# Patient Record
Sex: Male | Born: 1945 | Race: White | Hispanic: No | Marital: Married | Smoking: Never smoker
Health system: Southern US, Community
[De-identification: ages and names within clinical notes are randomized; demographics above are authoritative.]

## PROBLEM LIST (undated history)

## (undated) DIAGNOSIS — K635 Polyp of colon: Secondary | ICD-10-CM

## (undated) DIAGNOSIS — L570 Actinic keratosis: Secondary | ICD-10-CM

## (undated) DIAGNOSIS — D692 Other nonthrombocytopenic purpura: Secondary | ICD-10-CM

## (undated) DIAGNOSIS — Z972 Presence of dental prosthetic device (complete) (partial): Secondary | ICD-10-CM

## (undated) DIAGNOSIS — I10 Essential (primary) hypertension: Secondary | ICD-10-CM

## (undated) HISTORY — DX: Polyp of colon: K63.5

## (undated) HISTORY — DX: Actinic keratosis: L57.0

## (undated) HISTORY — DX: Other nonthrombocytopenic purpura: D69.2

---

## 1970-03-28 HISTORY — PX: KNEE SURGERY: SHX244

## 1998-03-28 HISTORY — PX: HAND / FINGER TENDON LESION EXCISION: SUR534

## 2005-08-08 ENCOUNTER — Ambulatory Visit: Payer: Self-pay | Admitting: Gastroenterology

## 2012-03-28 HISTORY — PX: EYE SURGERY: SHX253

## 2012-06-18 ENCOUNTER — Emergency Department (HOSPITAL_COMMUNITY): Payer: Medicare Other

## 2012-06-18 ENCOUNTER — Encounter (HOSPITAL_COMMUNITY): Payer: Self-pay | Admitting: Emergency Medicine

## 2012-06-18 ENCOUNTER — Encounter (HOSPITAL_COMMUNITY): Admission: EM | Disposition: A | Discharge status: Home / Self Care | Payer: Self-pay | Source: Home / Self Care

## 2012-06-18 ENCOUNTER — Encounter (HOSPITAL_COMMUNITY): Payer: Self-pay | Admitting: Anesthesiology

## 2012-06-18 ENCOUNTER — Inpatient Hospital Stay (HOSPITAL_COMMUNITY)
Admission: EM | Admit: 2012-06-18 | Discharge: 2012-06-27 | DRG: 957 | Disposition: A | Discharge status: Home / Self Care | Payer: Medicare Other | Attending: Orthopedic Surgery | Admitting: Orthopedic Surgery

## 2012-06-18 ENCOUNTER — Inpatient Hospital Stay (HOSPITAL_COMMUNITY): Payer: Medicare Other | Admitting: Anesthesiology

## 2012-06-18 ENCOUNTER — Inpatient Hospital Stay (HOSPITAL_COMMUNITY): Payer: Medicare Other

## 2012-06-18 DIAGNOSIS — K560 Paralytic ileus: Secondary | ICD-10-CM | POA: Diagnosis not present

## 2012-06-18 DIAGNOSIS — IMO0002 Reserved for concepts with insufficient information to code with codable children: Principal | ICD-10-CM | POA: Diagnosis present

## 2012-06-18 DIAGNOSIS — S81809A Unspecified open wound, unspecified lower leg, initial encounter: Secondary | ICD-10-CM

## 2012-06-18 DIAGNOSIS — S36039A Unspecified laceration of spleen, initial encounter: Secondary | ICD-10-CM

## 2012-06-18 DIAGNOSIS — S270XXA Traumatic pneumothorax, initial encounter: Secondary | ICD-10-CM

## 2012-06-18 DIAGNOSIS — S42102A Fracture of unspecified part of scapula, left shoulder, initial encounter for closed fracture: Secondary | ICD-10-CM

## 2012-06-18 DIAGNOSIS — S92309A Fracture of unspecified metatarsal bone(s), unspecified foot, initial encounter for closed fracture: Secondary | ICD-10-CM | POA: Diagnosis present

## 2012-06-18 DIAGNOSIS — S91009A Unspecified open wound, unspecified ankle, initial encounter: Secondary | ICD-10-CM

## 2012-06-18 DIAGNOSIS — S61409A Unspecified open wound of unspecified hand, initial encounter: Secondary | ICD-10-CM | POA: Diagnosis present

## 2012-06-18 DIAGNOSIS — R0989 Other specified symptoms and signs involving the circulatory and respiratory systems: Secondary | ICD-10-CM | POA: Diagnosis present

## 2012-06-18 DIAGNOSIS — R0609 Other forms of dyspnea: Secondary | ICD-10-CM | POA: Diagnosis present

## 2012-06-18 DIAGNOSIS — S2242XA Multiple fractures of ribs, left side, initial encounter for closed fracture: Secondary | ICD-10-CM

## 2012-06-18 DIAGNOSIS — J9819 Other pulmonary collapse: Secondary | ICD-10-CM | POA: Diagnosis not present

## 2012-06-18 DIAGNOSIS — S62308B Unspecified fracture of other metacarpal bone, initial encounter for open fracture: Secondary | ICD-10-CM

## 2012-06-18 DIAGNOSIS — W19XXXA Unspecified fall, initial encounter: Secondary | ICD-10-CM

## 2012-06-18 DIAGNOSIS — D62 Acute posthemorrhagic anemia: Secondary | ICD-10-CM | POA: Diagnosis not present

## 2012-06-18 DIAGNOSIS — R000 Tachycardia, unspecified: Secondary | ICD-10-CM | POA: Diagnosis present

## 2012-06-18 DIAGNOSIS — Y92009 Unspecified place in unspecified non-institutional (private) residence as the place of occurrence of the external cause: Secondary | ICD-10-CM

## 2012-06-18 DIAGNOSIS — S42109A Fracture of unspecified part of scapula, unspecified shoulder, initial encounter for closed fracture: Secondary | ICD-10-CM | POA: Diagnosis present

## 2012-06-18 DIAGNOSIS — S272XXA Traumatic hemopneumothorax, initial encounter: Secondary | ICD-10-CM

## 2012-06-18 DIAGNOSIS — S2249XA Multiple fractures of ribs, unspecified side, initial encounter for closed fracture: Secondary | ICD-10-CM

## 2012-06-18 DIAGNOSIS — W1789XA Other fall from one level to another, initial encounter: Secondary | ICD-10-CM | POA: Diagnosis present

## 2012-06-18 DIAGNOSIS — J95821 Acute postprocedural respiratory failure: Secondary | ICD-10-CM | POA: Diagnosis present

## 2012-06-18 DIAGNOSIS — J9600 Acute respiratory failure, unspecified whether with hypoxia or hypercapnia: Secondary | ICD-10-CM | POA: Diagnosis not present

## 2012-06-18 DIAGNOSIS — I10 Essential (primary) hypertension: Secondary | ICD-10-CM | POA: Diagnosis present

## 2012-06-18 DIAGNOSIS — S61419A Laceration without foreign body of unspecified hand, initial encounter: Secondary | ICD-10-CM

## 2012-06-18 DIAGNOSIS — S81009A Unspecified open wound, unspecified knee, initial encounter: Secondary | ICD-10-CM

## 2012-06-18 DIAGNOSIS — S92301A Fracture of unspecified metatarsal bone(s), right foot, initial encounter for closed fracture: Secondary | ICD-10-CM

## 2012-06-18 DIAGNOSIS — S62607B Fracture of unspecified phalanx of left little finger, initial encounter for open fracture: Secondary | ICD-10-CM

## 2012-06-18 DIAGNOSIS — S3600XA Unspecified injury of spleen, initial encounter: Secondary | ICD-10-CM | POA: Diagnosis present

## 2012-06-18 DIAGNOSIS — N179 Acute kidney failure, unspecified: Secondary | ICD-10-CM | POA: Diagnosis present

## 2012-06-18 DIAGNOSIS — R4182 Altered mental status, unspecified: Secondary | ICD-10-CM | POA: Diagnosis present

## 2012-06-18 HISTORY — DX: Essential (primary) hypertension: I10

## 2012-06-18 HISTORY — PX: I & D EXTREMITY: SHX5045

## 2012-06-18 LAB — TYPE AND SCREEN
ABO/RH(D): A POS
Unit division: 0

## 2012-06-18 LAB — BLOOD GAS, ARTERIAL
Drawn by: 31288
PEEP: 8 cmH2O
Pressure control: 15 cmH2O
pCO2 arterial: 45.4 mmHg — ABNORMAL HIGH (ref 35.0–45.0)
pO2, Arterial: 73.3 mmHg — ABNORMAL LOW (ref 80.0–100.0)

## 2012-06-18 LAB — COMPREHENSIVE METABOLIC PANEL
ALT: 42 U/L (ref 0–53)
AST: 51 U/L — ABNORMAL HIGH (ref 0–37)
Albumin: 4.2 g/dL (ref 3.5–5.2)
Alkaline Phosphatase: 62 U/L (ref 39–117)
Chloride: 100 mEq/L (ref 96–112)
Potassium: 3.7 mEq/L (ref 3.5–5.1)
Sodium: 138 mEq/L (ref 135–145)
Total Bilirubin: 0.5 mg/dL (ref 0.3–1.2)

## 2012-06-18 LAB — CBC
MCH: 31.5 pg (ref 26.0–34.0)
MCV: 88.4 fL (ref 78.0–100.0)
Platelets: 240 10*3/uL (ref 150–400)
RBC: 5.01 MIL/uL (ref 4.22–5.81)

## 2012-06-18 LAB — POCT I-STAT, CHEM 8
Chloride: 103 mEq/L (ref 96–112)
HCT: 47 % (ref 39.0–52.0)
Potassium: 3.5 mEq/L (ref 3.5–5.1)

## 2012-06-18 LAB — PROTIME-INR
INR: 0.99 (ref 0.00–1.49)
Prothrombin Time: 13 seconds (ref 11.6–15.2)

## 2012-06-18 SURGERY — IRRIGATION AND DEBRIDEMENT EXTREMITY
Anesthesia: General | Site: Hand | Laterality: Left | Wound class: Contaminated

## 2012-06-18 MED ORDER — ETOMIDATE 2 MG/ML IV SOLN
INTRAVENOUS | Status: AC
  Filled 2012-06-18: qty 20

## 2012-06-18 MED ORDER — SODIUM CHLORIDE 0.9 % IJ SOLN
9.0000 mL | INTRAMUSCULAR | Status: DC | PRN
  Administered 2012-06-21: 9 mL via INTRAVENOUS

## 2012-06-18 MED ORDER — NALOXONE HCL 0.4 MG/ML IJ SOLN: 0.4000 mg | INTRAMUSCULAR | Status: DC | PRN

## 2012-06-18 MED ORDER — ONDANSETRON HCL 4 MG/2ML IJ SOLN: 4.0000 mg | Freq: Four times a day (QID) | INTRAMUSCULAR | Status: DC | PRN

## 2012-06-18 MED ORDER — EPHEDRINE SULFATE 50 MG/ML IJ SOLN
INTRAMUSCULAR | Status: DC | PRN
  Administered 2012-06-18 (×2): 5 mg via INTRAVENOUS

## 2012-06-18 MED ORDER — DIPHENHYDRAMINE HCL 50 MG/ML IJ SOLN
12.5000 mg | Freq: Four times a day (QID) | INTRAMUSCULAR | Status: DC | PRN
  Administered 2012-06-24: 12.5 mg via INTRAVENOUS
  Filled 2012-06-18: qty 1

## 2012-06-18 MED ORDER — MORPHINE SULFATE 2 MG/ML IJ SOLN
INTRAMUSCULAR | Status: DC | PRN
  Administered 2012-06-18: 4 mg via INTRAVENOUS
  Administered 2012-06-18: 2 mg via INTRAVENOUS
  Administered 2012-06-18: 4 mg via INTRAVENOUS

## 2012-06-18 MED ORDER — PROPOFOL 10 MG/ML IV EMUL
5.0000 ug/kg/min | INTRAVENOUS | Status: DC
  Administered 2012-06-19: 40 ug/kg/min via INTRAVENOUS
  Administered 2012-06-19: 35 ug/kg/min via INTRAVENOUS
  Administered 2012-06-19: 40 ug/kg/min via INTRAVENOUS
  Administered 2012-06-19: 35 ug/kg/min via INTRAVENOUS
  Administered 2012-06-19: 40 ug/kg/min via INTRAVENOUS
  Filled 2012-06-18 (×8): qty 100

## 2012-06-18 MED ORDER — PROPOFOL 10 MG/ML IV EMUL
5.0000 ug/kg/min | INTRAVENOUS | Status: DC
  Administered 2012-06-18: 50 ug/kg/min via INTRAVENOUS

## 2012-06-18 MED ORDER — DIPHENHYDRAMINE HCL 12.5 MG/5ML PO ELIX
12.5000 mg | ORAL_SOLUTION | Freq: Four times a day (QID) | ORAL | Status: DC | PRN
  Filled 2012-06-18: qty 5

## 2012-06-18 MED ORDER — MIDAZOLAM HCL 5 MG/5ML IJ SOLN
INTRAMUSCULAR | Status: DC | PRN
  Administered 2012-06-18 (×2): 1 mg via INTRAVENOUS

## 2012-06-18 MED ORDER — FENTANYL BOLUS VIA INFUSION
25.0000 ug | Freq: Four times a day (QID) | INTRAVENOUS | Status: DC | PRN
  Filled 2012-06-18: qty 100

## 2012-06-18 MED ORDER — SUCCINYLCHOLINE CHLORIDE 20 MG/ML IJ SOLN
INTRAMUSCULAR | Status: AC
  Filled 2012-06-18: qty 1

## 2012-06-18 MED ORDER — BUPIVACAINE HCL (PF) 0.25 % IJ SOLN
INTRAMUSCULAR | Status: DC | PRN
  Administered 2012-06-18: 8 mL

## 2012-06-18 MED ORDER — PANTOPRAZOLE SODIUM 40 MG PO TBEC: 40.0000 mg | DELAYED_RELEASE_TABLET | Freq: Every day | ORAL | Status: DC

## 2012-06-18 MED ORDER — VANCOMYCIN HCL IN DEXTROSE 1-5 GM/200ML-% IV SOLN
1000.0000 mg | Freq: Once | INTRAVENOUS | Status: AC
  Administered 2012-06-18: 1000 mg via INTRAVENOUS
  Filled 2012-06-18: qty 200

## 2012-06-18 MED ORDER — LIDOCAINE HCL (CARDIAC) 20 MG/ML IV SOLN
INTRAVENOUS | Status: DC | PRN
  Administered 2012-06-18: 60 mg via INTRAVENOUS
  Administered 2012-06-18: 80 mg via INTRAVENOUS

## 2012-06-18 MED ORDER — SODIUM CHLORIDE 0.9 % IV SOLN
INTRAVENOUS | Status: DC | PRN
  Administered 2012-06-18: 1000 mL via INTRAVENOUS

## 2012-06-18 MED ORDER — IOHEXOL 300 MG/ML  SOLN
80.0000 mL | Freq: Once | INTRAMUSCULAR | Status: AC | PRN
  Administered 2012-06-18: 80 mL via INTRAVENOUS

## 2012-06-18 MED ORDER — PHENYLEPHRINE HCL 10 MG/ML IJ SOLN
INTRAMUSCULAR | Status: DC | PRN
  Administered 2012-06-18: 80 ug via INTRAVENOUS
  Administered 2012-06-18: 40 ug via INTRAVENOUS

## 2012-06-18 MED ORDER — MORPHINE SULFATE 2 MG/ML IJ SOLN
INTRAMUSCULAR | Status: AC
  Filled 2012-06-18: qty 2

## 2012-06-18 MED ORDER — OXYCODONE HCL 5 MG PO TABS: 5.0000 mg | ORAL_TABLET | Freq: Once | ORAL | Status: DC | PRN

## 2012-06-18 MED ORDER — SODIUM CHLORIDE 0.9 % IV SOLN
25.0000 ug/h | INTRAVENOUS | Status: DC
  Administered 2012-06-18 – 2012-06-19 (×2): 50 ug/h via INTRAVENOUS
  Administered 2012-06-19 – 2012-06-20 (×2): 125 ug/h via INTRAVENOUS
  Administered 2012-06-21: 180 ug/h via INTRAVENOUS
  Filled 2012-06-18 (×6): qty 50

## 2012-06-18 MED ORDER — ENOXAPARIN SODIUM 40 MG/0.4ML ~~LOC~~ SOLN: 40.0000 mg | SUBCUTANEOUS | Status: DC

## 2012-06-18 MED ORDER — POTASSIUM CHLORIDE IN NACL 20-0.9 MEQ/L-% IV SOLN
INTRAVENOUS | Status: DC
  Administered 2012-06-18 – 2012-06-25 (×13): via INTRAVENOUS
  Filled 2012-06-18 (×18): qty 1000

## 2012-06-18 MED ORDER — HYDROMORPHONE 0.3 MG/ML IV SOLN: INTRAVENOUS | Status: DC

## 2012-06-18 MED ORDER — SODIUM CHLORIDE 0.9 % IR SOLN
Status: DC | PRN
  Administered 2012-06-18: 1000 mL

## 2012-06-18 MED ORDER — CHLORHEXIDINE GLUCONATE 0.12 % MT SOLN
OROMUCOSAL | Status: AC
  Administered 2012-06-18: 15 mL
  Filled 2012-06-18: qty 15

## 2012-06-18 MED ORDER — PHENYLEPHRINE HCL 10 MG/ML IJ SOLN
10.0000 mg | INTRAVENOUS | Status: DC | PRN
  Administered 2012-06-18: 25 ug/min via INTRAVENOUS

## 2012-06-18 MED ORDER — ARTIFICIAL TEARS OP OINT
TOPICAL_OINTMENT | OPHTHALMIC | Status: DC | PRN
  Administered 2012-06-18: 1 via OPHTHALMIC

## 2012-06-18 MED ORDER — OXYCODONE HCL 5 MG/5ML PO SOLN: 5.0000 mg | Freq: Once | ORAL | Status: DC | PRN

## 2012-06-18 MED ORDER — LIDOCAINE HCL (CARDIAC) 20 MG/ML IV SOLN
INTRAVENOUS | Status: AC
  Filled 2012-06-18: qty 5

## 2012-06-18 MED ORDER — ROCURONIUM BROMIDE 50 MG/5ML IV SOLN
INTRAVENOUS | Status: AC
  Filled 2012-06-18: qty 2

## 2012-06-18 MED ORDER — FENTANYL CITRATE 0.05 MG/ML IJ SOLN
INTRAMUSCULAR | Status: DC | PRN
  Administered 2012-06-18 (×2): 25 ug via INTRAVENOUS
  Administered 2012-06-18 (×2): 50 ug via INTRAVENOUS
  Administered 2012-06-18: 100 ug via INTRAVENOUS
  Administered 2012-06-18: 50 ug via INTRAVENOUS

## 2012-06-18 MED ORDER — MIDAZOLAM HCL 2 MG/2ML IJ SOLN
0.5000 mg | Freq: Once | INTRAMUSCULAR | Status: AC | PRN
  Administered 2012-06-18: 2 mg via INTRAVENOUS

## 2012-06-18 MED ORDER — PROPOFOL 10 MG/ML IV BOLUS
INTRAVENOUS | Status: DC | PRN
  Administered 2012-06-18: 170 mg via INTRAVENOUS

## 2012-06-18 MED ORDER — LACTATED RINGERS IV SOLN
INTRAVENOUS | Status: DC | PRN
  Administered 2012-06-18 (×2): via INTRAVENOUS

## 2012-06-18 MED ORDER — PANTOPRAZOLE SODIUM 40 MG IV SOLR
40.0000 mg | Freq: Every day | INTRAVENOUS | Status: DC
  Administered 2012-06-19 – 2012-06-25 (×6): 40 mg via INTRAVENOUS
  Filled 2012-06-18 (×9): qty 40

## 2012-06-18 MED ORDER — VANCOMYCIN HCL IN DEXTROSE 1-5 GM/200ML-% IV SOLN
1000.0000 mg | INTRAVENOUS | Status: AC
  Administered 2012-06-19: 1000 mg via INTRAVENOUS
  Filled 2012-06-18: qty 200

## 2012-06-18 MED ORDER — MORPHINE SULFATE 4 MG/ML IJ SOLN
INTRAMUSCULAR | Status: AC
  Filled 2012-06-18: qty 1

## 2012-06-18 MED ORDER — SUCCINYLCHOLINE CHLORIDE 20 MG/ML IJ SOLN
INTRAMUSCULAR | Status: DC | PRN
  Administered 2012-06-18: 100 mg via INTRAVENOUS

## 2012-06-18 MED ORDER — MIDAZOLAM HCL 2 MG/2ML IJ SOLN
INTRAMUSCULAR | Status: AC
  Administered 2012-06-18: 2 mg
  Filled 2012-06-18: qty 2

## 2012-06-18 SURGICAL SUPPLY — 64 items
BANDAGE CONFORM 2  STR LF (GAUZE/BANDAGES/DRESSINGS) IMPLANT
BANDAGE ELASTIC 3 VELCRO ST LF (GAUZE/BANDAGES/DRESSINGS) ×3 IMPLANT
BANDAGE ELASTIC 4 VELCRO ST LF (GAUZE/BANDAGES/DRESSINGS) ×3 IMPLANT
BANDAGE GAUZE ELAST BULKY 4 IN (GAUZE/BANDAGES/DRESSINGS) ×3 IMPLANT
BNDG CMPR 9X4 STRL LF SNTH (GAUZE/BANDAGES/DRESSINGS) ×2
BNDG ESMARK 4X9 LF (GAUZE/BANDAGES/DRESSINGS) ×3 IMPLANT
CLOTH BEACON ORANGE TIMEOUT ST (SAFETY) ×3 IMPLANT
CORDS BIPOLAR (ELECTRODE) ×3 IMPLANT
COVER SURGICAL LIGHT HANDLE (MISCELLANEOUS) ×3 IMPLANT
CUFF TOURNIQUET SINGLE 18IN (TOURNIQUET CUFF) ×3 IMPLANT
CUFF TOURNIQUET SINGLE 24IN (TOURNIQUET CUFF) IMPLANT
DECANTER SPIKE VIAL GLASS SM (MISCELLANEOUS) ×3 IMPLANT
DRAIN PENROSE 1/4X12 LTX STRL (WOUND CARE) IMPLANT
DRAPE OEC MINIVIEW 54X84 (DRAPES) ×3 IMPLANT
DRAPE SURG 17X23 STRL (DRAPES) ×3 IMPLANT
DRSG PAD ABDOMINAL 8X10 ST (GAUZE/BANDAGES/DRESSINGS) ×6 IMPLANT
DURAPREP 26ML APPLICATOR (WOUND CARE) IMPLANT
ELECT REM PT RETURN 9FT ADLT (ELECTROSURGICAL)
ELECTRODE REM PT RTRN 9FT ADLT (ELECTROSURGICAL) IMPLANT
GAUZE PACKING IODOFORM 1/4X5 (PACKING) IMPLANT
GAUZE XEROFORM 1X8 LF (GAUZE/BANDAGES/DRESSINGS) ×3 IMPLANT
GLOVE BIO SURGEON STRL SZ8.5 (GLOVE) ×3 IMPLANT
GLOVE BIOGEL PI IND STRL 8 (GLOVE) IMPLANT
GLOVE BIOGEL PI INDICATOR 8 (GLOVE)
GOWN PREVENTION PLUS XLARGE (GOWN DISPOSABLE) ×3 IMPLANT
GOWN STRL NON-REIN LRG LVL3 (GOWN DISPOSABLE) ×6 IMPLANT
HANDPIECE INTERPULSE COAX TIP (DISPOSABLE)
KIT BASIN OR (CUSTOM PROCEDURE TRAY) ×3 IMPLANT
KIT ROOM TURNOVER OR (KITS) ×3 IMPLANT
LOOP VESSEL MAXI BLUE (MISCELLANEOUS) IMPLANT
MANIFOLD NEPTUNE II (INSTRUMENTS) ×3 IMPLANT
NEEDLE HYPO 25GX1X1/2 BEV (NEEDLE) IMPLANT
NEEDLE HYPO 25X1 1.5 SAFETY (NEEDLE) ×3 IMPLANT
NS IRRIG 1000ML POUR BTL (IV SOLUTION) ×6 IMPLANT
PACK ORTHO EXTREMITY (CUSTOM PROCEDURE TRAY) ×3 IMPLANT
PAD ARMBOARD 7.5X6 YLW CONV (MISCELLANEOUS) ×6 IMPLANT
PAD CAST 4YDX4 CTTN HI CHSV (CAST SUPPLIES) ×2 IMPLANT
PADDING CAST COTTON 4X4 STRL (CAST SUPPLIES) ×3
SET HNDPC FAN SPRY TIP SCT (DISPOSABLE) IMPLANT
SPEAR EYE SURG WECK-CEL (MISCELLANEOUS) IMPLANT
SPECIMEN JAR SMALL (MISCELLANEOUS) IMPLANT
SPLINT PLASTER CAST XFAST 4X15 (CAST SUPPLIES) ×2 IMPLANT
SPLINT PLASTER XTRA FAST SET 4 (CAST SUPPLIES) ×1
SPONGE GAUZE 4X4 12PLY (GAUZE/BANDAGES/DRESSINGS) ×3 IMPLANT
SPONGE LAP 18X18 X RAY DECT (DISPOSABLE) ×3 IMPLANT
STRIP CLOSURE SKIN 1/2X4 (GAUZE/BANDAGES/DRESSINGS) IMPLANT
SUCTION FRAZIER TIP 10 FR DISP (SUCTIONS) ×3 IMPLANT
SUT ETHIBOND 3-0 V-5 (SUTURE) IMPLANT
SUT ETHILON 5 0 PS 2 18 (SUTURE) IMPLANT
SUT MERSILENE 4 0 P 3 (SUTURE) IMPLANT
SUT PROLENE 3 0 PS 2 (SUTURE) IMPLANT
SUT SILK 4 0 PS 2 (SUTURE) IMPLANT
SUT VIC AB 3-0 FS2 27 (SUTURE) IMPLANT
SUT VICRYL RAPIDE 4/0 PS 2 (SUTURE) ×9 IMPLANT
SYR 20CC LL (SYRINGE) IMPLANT
SYR CONTROL 10ML LL (SYRINGE) ×3 IMPLANT
TOWEL OR 17X24 6PK STRL BLUE (TOWEL DISPOSABLE) ×3 IMPLANT
TOWEL OR 17X26 10 PK STRL BLUE (TOWEL DISPOSABLE) ×3 IMPLANT
TRAY FOLEY CATH 14FR (SET/KITS/TRAYS/PACK) ×3 IMPLANT
TUBE ANAEROBIC SPECIMEN COL (MISCELLANEOUS) IMPLANT
TUBE CONNECTING 12X1/4 (SUCTIONS) ×3 IMPLANT
UNDERPAD 30X30 INCONTINENT (UNDERPADS AND DIAPERS) ×3 IMPLANT
WATER STERILE IRR 1000ML POUR (IV SOLUTION) ×3 IMPLANT
YANKAUER SUCT BULB TIP NO VENT (SUCTIONS) ×3 IMPLANT

## 2012-06-18 NOTE — Consult Note (Signed)
Reason for Consult:  Left scapula body fracture Referring Physician:   Lindie Spruce, MD  Ankit Degregorio is an 67 y.o. male.  HPI:   67 yo fall from height out of a tree he was trimming limbs from.  I am seeing him in the PACU post surgery on his left hand.  He is currently intubated.  History reviewed. No pertinent past medical history.  Past Surgical History  Procedure Laterality Date  . Hand / finger tendon lesion excision Left 2000  . Knee surgery Left 1972    No family history on file.  Social History:  has no tobacco, alcohol, and drug history on file.  Allergies:  Allergies  Allergen Reactions  . Penicillins Hives    Medications: I have reviewed the patient's current medications.  Results for orders placed during the hospital encounter of 06/18/12 (from the past 48 hour(s))  TYPE AND SCREEN     Status: None   Collection Time    06/18/12  3:50 PM      Result Value Range   ABO/RH(D) A POS     Antibody Screen NEG     Sample Expiration 06/21/2012     Unit Number [id number]     Blood Component Type RED CELLS,LR     Unit division 00     Status of Unit REL FROM [facility]     Unit tag comment VERBAL ORDERS PER DR LOCKWOOD     Transfusion Status OK TO TRANSFUSE     Crossmatch Result NOT NEEDED     Unit Number [id number]     Blood Component Type RED CELLS,LR     Unit division 00     Status of Unit REL FROM [facility]     Unit tag comment VERBAL ORDERS PER DR LOCKWOOD     Transfusion Status OK TO TRANSFUSE     Crossmatch Result NOT NEEDED    ABO/RH     Status: None   Collection Time    06/18/12  3:50 PM      Result Value Range   ABO/RH(D) A POS    POCT I-STAT, CHEM 8     Status: Abnormal   Collection Time    06/18/12  4:07 PM      Result Value Range   Sodium 139  135 - 145 mEq/L   Potassium 3.5  3.5 - 5.1 mEq/L   Chloride 103  96 - 112 mEq/L   BUN 13  6 - 23 mg/dL   Creatinine, Ser 3.08 (*) 0.50 - 1.35 mg/dL   Glucose, Bld 657 (*) 70 - 99 mg/dL   Calcium, Ion 8.46  9.62  - 1.30 mmol/L   TCO2 24  0 - 100 mmol/L   Hemoglobin 16.0  13.0 - 17.0 g/dL   HCT 95.2  84.1 - 32.4 %  COMPREHENSIVE METABOLIC PANEL     Status: Abnormal   Collection Time    06/18/12  4:22 PM      Result Value Range   Sodium 138  135 - 145 mEq/L   Potassium 3.7  3.5 - 5.1 mEq/L   Chloride 100  96 - 112 mEq/L   CO2 24  19 - 32 mEq/L   Glucose, Bld 204 (*) 70 - 99 mg/dL   BUN 13  6 - 23 mg/dL   Creatinine, Ser 4.01  0.50 - 1.35 mg/dL   Calcium 9.8  8.4 - 02.7 mg/dL   Total Protein 7.3  6.0 - 8.3 g/dL   Albumin 4.2  3.5 - 5.2 g/dL   AST 51 (*) 0 - 37 U/L   ALT 42  0 - 53 U/L   Alkaline Phosphatase 62  39 - 117 U/L   Total Bilirubin 0.5  0.3 - 1.2 mg/dL   GFR calc non Af Amer 63 (*) >90 mL/min   GFR calc Af Amer 73 (*) >90 mL/min   Comment:            The eGFR has been calculated     using the CKD EPI equation.     This calculation has not been     validated in all clinical     situations.     eGFR's persistently     <90 mL/min signify     possible Chronic Kidney Disease.  CBC     Status: Abnormal   Collection Time    06/18/12  4:22 PM      Result Value Range   WBC 17.6 (*) 4.0 - 10.5 K/uL   RBC 5.01  4.22 - 5.81 MIL/uL   Hemoglobin 15.8  13.0 - 17.0 g/dL   HCT 16.1  09.6 - 04.5 %   MCV 88.4  78.0 - 100.0 fL   MCH 31.5  26.0 - 34.0 pg   MCHC 35.7  30.0 - 36.0 g/dL   RDW 40.9  81.1 - 91.4 %   Platelets 240  150 - 400 K/uL  PROTIME-INR     Status: None   Collection Time    06/18/12  4:22 PM      Result Value Range   Prothrombin Time 13.0  11.6 - 15.2 seconds   INR 0.99  0.00 - 1.49    Ct Chest W Contrast  06/18/2012  *RADIOLOGY REPORT*  Clinical Data:  Larey Seat.  Chest and abdominal trauma.  Pneumothorax.  CT CHEST, ABDOMEN AND PELVIS WITH CONTRAST  Technique:  Multidetector CT imaging of the chest, abdomen and pelvis was performed following the standard protocol during bolus administration of intravenous contrast.  Contrast: 80mL OMNIPAQUE IOHEXOL 300 MG/ML  SOLN   Comparison:  Chest x-ray 06/18/2012.  CT CHEST  Findings:  Examination of the chest wall demonstrates extensive air throughout the deep soft tissues and in the subcutaneous fat mainly along the left chest.  There is also a pneumomediastinum, pneumothorax and air tracking up of the neck.  No chest wall hematoma.  No supraclavicular or axillary mass or adenopathy.  The sternum is intact and the thoracic vertebral bodies are normally aligned.  No definite fractures.  There are numerous left-sided rib fractures with several ribs are fractured and more than one placed consistent with a flail chest. The 3rd through 12th ribs are fractured the left side.  There is also a comminuted fracture of the left scapula with displaced fragments.  The clavicles are intact and both shoulders are intact.  The heart is normal in size.  No pericardial effusion or hematoma. The aorta is normal in caliber.  No dissection.  No mediastinal hematoma.  The esophagus grossly normal.  Coronary artery calcifications are noted.  There is fairly extensive pneumomediastinum.  Examination of the lung parenchyma demonstrates a left-sided chest tube in place.  There is a small pneumothorax.  Extensive atelectasis and possible left lower lobe pulmonary contusion.  A small left pleural effusion or pleural hematoma is noted.  IMPRESSION:  1.  Numerous left-sided rib fractures involving the 3rd through 12th ribs with several rib fractures in two places (flail chest). 2.  Pneumothorax, pneumomediastinum and  extensive subcutaneous emphysema. 3.  The heart and great vessels are intact.  No mediastinal hematoma. 4.  A small left pleural effusion/pleural hematoma with areas of bilateral atelectasis. 5.  Comminuted left scapular fracture.  CT ABDOMEN AND PELVIS  Findings:  The solid abdominal organs are intact. On the initial phase imaging cannot exclude a small contusion involving the posterior and inferior aspect of the spleen.  The gallbladder is normal.  No  common bile duct dilatation.  The stomach, duodenum, small bowel and colon are grossly normal without oral contrast.  No mesenteric or retroperitoneal mass, adenopathy or hematoma.  The aorta is normal in caliber.  Minimal scattered atherosclerotic calcifications.  The origin of the celiac access is abnormal. There is central low attenuation which could reflect clot and there is strandy interstitial change around the vessel.  Findings suspicious for acute short segment dissection.  Recommend dedicated CTA when able.  No lumbar vertebral body fractures or transverse process fractures. The facets are normally aligned.  The bony pelvis is intact.  Mild SI joint degenerative changes.  No pelvic mass or hematoma.  No free pelvic fluid collections.  The bladder, prostate gland and seminal vesicles are unremarkable.  There are bilateral inguinal hernias with a high riding right testicle in the lower inguinal canal.  The left inguinal hernia only contains fat.  IMPRESSION:  1.  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  A dedicated CT angiogram of the abdomen is recommended. 2.  Possible subtle splenic contusions.  Recommend close clinical observation. 3.  Bilateral inguinal hernias.   Original Report Authenticated By: Rudie Meyer, M.D.    Ct Cervical Spine Wo Contrast  06/18/2012  *RADIOLOGY REPORT*  Clinical Data: Larey Seat.  Injured chest.  CT CERVICAL SPINE WITHOUT CONTRAST  Technique:  Multidetector CT imaging of the cervical spine was performed. Multiplanar CT image reconstructions were also generated.  Comparison: None  Findings: The sagittal reformatted images demonstrate normal alignment of the cervical vertebral bodies.  Disc spaces and vertebral bodies are maintained.  No acute bony findings or abnormal prevertebral soft tissue swelling.  The facets are normally aligned.  No facet or laminar fractures are seen. No large disc protrusions.  The neural foramen are patent.  The  skull base C1 and C1-C2 articulations are maintained.  The dens is normal.  There are scattered cervical lymph nodes.  The lung apices are clear.  Extensive subcutaneous emphysema noted, left greater than right along with air tracking up along the esophagus and into the space of the neck related to the chest trauma and pneumomediastinum.  IMPRESSION:  1.  Normal alignment and no acute bony findings. 2.  Extensive air throughout the soft tissues.   Original Report Authenticated By: Rudie Meyer, M.D.    Ct Abdomen Pelvis W Contrast  06/18/2012  *RADIOLOGY REPORT*  Clinical Data:  Larey Seat.  Chest and abdominal trauma.  Pneumothorax.  CT CHEST, ABDOMEN AND PELVIS WITH CONTRAST  Technique:  Multidetector CT imaging of the chest, abdomen and pelvis was performed following the standard protocol during bolus administration of intravenous contrast.  Contrast: 80mL OMNIPAQUE IOHEXOL 300 MG/ML  SOLN  Comparison:  Chest x-ray 06/18/2012.  CT CHEST  Findings:  Examination of the chest wall demonstrates extensive air throughout the deep soft tissues and in the subcutaneous fat mainly along the left chest.  There is also a pneumomediastinum, pneumothorax and air tracking up of the neck.  No chest wall hematoma.  No supraclavicular or  axillary mass or adenopathy.  The sternum is intact and the thoracic vertebral bodies are normally aligned.  No definite fractures.  There are numerous left-sided rib fractures with several ribs are fractured and more than one placed consistent with a flail chest. The 3rd through 12th ribs are fractured the left side.  There is also a comminuted fracture of the left scapula with displaced fragments.  The clavicles are intact and both shoulders are intact.  The heart is normal in size.  No pericardial effusion or hematoma. The aorta is normal in caliber.  No dissection.  No mediastinal hematoma.  The esophagus grossly normal.  Coronary artery calcifications are noted.  There is fairly extensive  pneumomediastinum.  Examination of the lung parenchyma demonstrates a left-sided chest tube in place.  There is a small pneumothorax.  Extensive atelectasis and possible left lower lobe pulmonary contusion.  A small left pleural effusion or pleural hematoma is noted.  IMPRESSION:  1.  Numerous left-sided rib fractures involving the 3rd through 12th ribs with several rib fractures in two places (flail chest). 2.  Pneumothorax, pneumomediastinum and extensive subcutaneous emphysema. 3.  The heart and great vessels are intact.  No mediastinal hematoma. 4.  A small left pleural effusion/pleural hematoma with areas of bilateral atelectasis. 5.  Comminuted left scapular fracture.  CT ABDOMEN AND PELVIS  Findings:  The solid abdominal organs are intact. On the initial phase imaging cannot exclude a small contusion involving the posterior and inferior aspect of the spleen.  The gallbladder is normal.  No common bile duct dilatation.  The stomach, duodenum, small bowel and colon are grossly normal without oral contrast.  No mesenteric or retroperitoneal mass, adenopathy or hematoma.  The aorta is normal in caliber.  Minimal scattered atherosclerotic calcifications.  The origin of the celiac access is abnormal. There is central low attenuation which could reflect clot and there is strandy interstitial change around the vessel.  Findings suspicious for acute short segment dissection.  Recommend dedicated CTA when able.  No lumbar vertebral body fractures or transverse process fractures. The facets are normally aligned.  The bony pelvis is intact.  Mild SI joint degenerative changes.  No pelvic mass or hematoma.  No free pelvic fluid collections.  The bladder, prostate gland and seminal vesicles are unremarkable.  There are bilateral inguinal hernias with a high riding right testicle in the lower inguinal canal.  The left inguinal hernia only contains fat.  IMPRESSION:  1.  Findings suspicious for a short segment dissection at  the celiac axis origin.  A dedicated CT angiogram of the abdomen is recommended. 2.  Possible subtle splenic contusions.  Recommend close clinical observation. 3.  Bilateral inguinal hernias.   Original Report Authenticated By: Rudie Meyer, M.D.    Dg Pelvis Portable  06/18/2012  *RADIOLOGY REPORT*  Clinical Data: Trauma  PORTABLE PELVIS  Comparison: None.  Findings: No acute fracture and no dislocation.  IMPRESSION: No acute bony pathology.   Original Report Authenticated By: Jolaine Click, M.D.    Dg Chest Portable 1 View  06/18/2012  *RADIOLOGY REPORT*  Clinical Data: Chest tube placement, pneumothorax, rib fractures, fell from tree, flail chest  PORTABLE CHEST - 1 VIEW  Comparison: Portable exam 1615 hours compared to 06/18/2012 at 1558 hours  Findings: New left basilar thoracostomy tube. Decreased left pneumothorax. Low lung volumes with scattered infiltrate within left lung. Mild right basilar atelectasis. Multiple left rib fractures again seen. Left chest wall emphysema extending into axilla.  IMPRESSION: Decrease  in left pneumothorax following left thoracostomy tube placement. Mild scattered infiltrate in left lung with minimal right basilar atelectasis. Multiple left rib fractures.   Original Report Authenticated By: Ulyses Southward, M.D.    Dg Chest Portable 1 View  06/18/2012  *RADIOLOGY REPORT*  Clinical Data: Fall from tree with flail chest.  PORTABLE CHEST - 1 VIEW  Comparison: None.  Findings: Single supine view of the chest was obtained.  There is lucency along the left lung base and findings are consistent with a deep sulcus sign and pneumothorax.  There is a large amount of subcutaneous gas.  There are multiple displaced left rib fractures. The entire right side of the chest was not imaged.  Fractures involving the left fourth, fifth, sixth and seventh ribs.  The left fourth and fifth rib fractures may be segmental fractures.  IMPRESSION: Multiple left rib fractures with a pneumothorax.  Large  amount of subcutaneous gas.   Original Report Authenticated By: Richarda Overlie, M.D.    Dg Hand Complete Left  06/18/2012  *RADIOLOGY REPORT*  Clinical Data: Injury  LEFT HAND - COMPLETE 3+ VIEW  Comparison: None.  Findings: There is an acute fracture involving the proximal metaphysis of the proximal phalanx of the small finger.  Slight anterior displacement of the distal fragment.  Slight dorsal angulation.  There is also a spiral fracture involving the distal metaphysis of the third metacarpal with some displacement.  Soft tissue injury of the small finger may be present.  IMPRESSION: Acute fractures involving the proximal phalanx of the small finger and distal third metacarpal.   Original Report Authenticated By: Jolaine Click, M.D.     ROS Blood pressure 111/42, pulse 94, temperature 97.4 F (36.3 C), temperature source Oral, resp. rate 16, SpO2 95.00%. Physical Exam  Musculoskeletal:       Left shoulder: He exhibits swelling and crepitus.       Arms:      Legs:  Pelvis stable to ap/lat compression Bilateral hips, knees, and lower extremities stable on exam  Assessment/Plan: Left scapula body fracture 1) Non-operative treatment for this type of injury. Sling for comfort only once mobilizing. 2) Good secondary extremity survey when awake and alert  Kathryne Hitch 06/18/2012, 9:28 PM

## 2012-06-18 NOTE — Progress Notes (Signed)
This note also relates to the following rows which could not be included: Pulse Rate - Cannot attach notes to unvalidated device data ECG Heart Rate - Cannot attach notes to unvalidated device data Resp - Cannot attach notes to unvalidated device data SpO2 - Cannot attach notes to unvalidated device data   

## 2012-06-18 NOTE — Progress Notes (Signed)
Orthopedic Tech Progress Note Patient Details:  Christopher Henderson 11-25-1945 [id number]  Patient ID: Christopher Henderson, male   DOB: 1946-01-14, 67 y.o.   MRN: [id number]  Made level 1 trauma visit Nikki Dom 06/18/2012, 4:20 PM

## 2012-06-18 NOTE — Consult Note (Signed)
Reason for Consult:left hand injury Referring Physician: [name] is an 67 y.o. male.  HPI: s/p fall from tree with complex open injury to left hand  No past medical history on file.  No past surgical history on file.  No family history on file.  Social History:  has no tobacco, alcohol, and drug history on file.  Allergies: No Known Allergies  Medications: Prior to Admission:  (Not in a hospital admission)  Results for orders placed during the hospital encounter of 06/18/12 (from the past 48 hour(s))  TYPE AND SCREEN     Status: None   Collection Time    06/18/12  3:50 PM      Result Value Range   ABO/RH(D) A POS     Antibody Screen NEG     Sample Expiration 06/21/2012     Unit Number [id number]     Blood Component Type RED CELLS,LR     Unit division 00     Status of Unit REL FROM [facility]     Unit tag comment VERBAL ORDERS PER DR LOCKWOOD     Transfusion Status OK TO TRANSFUSE     Crossmatch Result PENDING     Unit Number [id number]     Blood Component Type RED CELLS,LR     Unit division 00     Status of Unit REL FROM [facility]     Unit tag comment VERBAL ORDERS PER DR LOCKWOOD     Transfusion Status OK TO TRANSFUSE     Crossmatch Result PENDING    POCT I-STAT, CHEM 8     Status: Abnormal   Collection Time    06/18/12  4:07 PM      Result Value Range   Sodium 139  135 - 145 mEq/L   Potassium 3.5  3.5 - 5.1 mEq/L   Chloride 103  96 - 112 mEq/L   BUN 13  6 - 23 mg/dL   Creatinine, Ser 4.03 (*) 0.50 - 1.35 mg/dL   Glucose, Bld 474 (*) 70 - 99 mg/dL   Calcium, Ion 2.59  5.63 - 1.30 mmol/L   TCO2 24  0 - 100 mmol/L   Hemoglobin 16.0  13.0 - 17.0 g/dL   HCT 87.5  64.3 - 32.9 %  COMPREHENSIVE METABOLIC PANEL     Status: Abnormal   Collection Time    06/18/12  4:22 PM      Result Value Range   Sodium 138  135 - 145 mEq/L   Potassium 3.7  3.5 - 5.1 mEq/L   Chloride 100  96 - 112 mEq/L   CO2 24  19 - 32 mEq/L   Glucose, Bld 204 (*) 70 - 99 mg/dL    BUN 13  6 - 23 mg/dL   Creatinine, Ser 5.18  0.50 - 1.35 mg/dL   Calcium 9.8  8.4 - 84.1 mg/dL   Total Protein 7.3  6.0 - 8.3 g/dL   Albumin 4.2  3.5 - 5.2 g/dL   AST 51 (*) 0 - 37 U/L   ALT 42  0 - 53 U/L   Alkaline Phosphatase 62  39 - 117 U/L   Total Bilirubin 0.5  0.3 - 1.2 mg/dL   GFR calc non Af Amer 63 (*) >90 mL/min   GFR calc Af Amer 73 (*) >90 mL/min   Comment:            The eGFR has been calculated     using the CKD EPI equation.  This calculation has not been     validated in all clinical     situations.     eGFR's persistently     <90 mL/min signify     possible Chronic Kidney Disease.  CBC     Status: Abnormal   Collection Time    06/18/12  4:22 PM      Result Value Range   WBC 17.6 (*) 4.0 - 10.5 K/uL   RBC 5.01  4.22 - 5.81 MIL/uL   Hemoglobin 15.8  13.0 - 17.0 g/dL   HCT 40.1  02.7 - 25.3 %   MCV 88.4  78.0 - 100.0 fL   MCH 31.5  26.0 - 34.0 pg   MCHC 35.7  30.0 - 36.0 g/dL   RDW 66.4  40.3 - 47.4 %   Platelets 240  150 - 400 K/uL  PROTIME-INR     Status: None   Collection Time    06/18/12  4:22 PM      Result Value Range   Prothrombin Time 13.0  11.6 - 15.2 seconds   INR 0.99  0.00 - 1.49    Ct Chest W Contrast  06/18/2012  *RADIOLOGY REPORT*  Clinical Data:  Larey Seat.  Chest and abdominal trauma.  Pneumothorax.  CT CHEST, ABDOMEN AND PELVIS WITH CONTRAST  Technique:  Multidetector CT imaging of the chest, abdomen and pelvis was performed following the standard protocol during bolus administration of intravenous contrast.  Contrast: 80mL OMNIPAQUE IOHEXOL 300 MG/ML  SOLN  Comparison:  Chest x-ray 06/18/2012.  CT CHEST  Findings:  Examination of the chest wall demonstrates extensive air throughout the deep soft tissues and in the subcutaneous fat mainly along the left chest.  There is also a pneumomediastinum, pneumothorax and air tracking up of the neck.  No chest wall hematoma.  No supraclavicular or axillary mass or adenopathy.  The sternum is intact and  the thoracic vertebral bodies are normally aligned.  No definite fractures.  There are numerous left-sided rib fractures with several ribs are fractured and more than one placed consistent with a flail chest. The 3rd through 12th ribs are fractured the left side.  There is also a comminuted fracture of the left scapula with displaced fragments.  The clavicles are intact and both shoulders are intact.  The heart is normal in size.  No pericardial effusion or hematoma. The aorta is normal in caliber.  No dissection.  No mediastinal hematoma.  The esophagus grossly normal.  Coronary artery calcifications are noted.  There is fairly extensive pneumomediastinum.  Examination of the lung parenchyma demonstrates a left-sided chest tube in place.  There is a small pneumothorax.  Extensive atelectasis and possible left lower lobe pulmonary contusion.  A small left pleural effusion or pleural hematoma is noted.  IMPRESSION:  1.  Numerous left-sided rib fractures involving the 3rd through 12th ribs with several rib fractures in two places (flail chest). 2.  Pneumothorax, pneumomediastinum and extensive subcutaneous emphysema. 3.  The heart and great vessels are intact.  No mediastinal hematoma. 4.  A small left pleural effusion/pleural hematoma with areas of bilateral atelectasis. 5.  Comminuted left scapular fracture.  CT ABDOMEN AND PELVIS  Findings:  The solid abdominal organs are intact. On the initial phase imaging cannot exclude a small contusion involving the posterior and inferior aspect of the spleen.  The gallbladder is normal.  No common bile duct dilatation.  The stomach, duodenum, small bowel and colon are grossly normal without oral contrast.  No  mesenteric or retroperitoneal mass, adenopathy or hematoma.  The aorta is normal in caliber.  Minimal scattered atherosclerotic calcifications.  The origin of the celiac access is abnormal. There is central low attenuation which could reflect clot and there is strandy  interstitial change around the vessel.  Findings suspicious for acute short segment dissection.  Recommend dedicated CTA when able.  No lumbar vertebral body fractures or transverse process fractures. The facets are normally aligned.  The bony pelvis is intact.  Mild SI joint degenerative changes.  No pelvic mass or hematoma.  No free pelvic fluid collections.  The bladder, prostate gland and seminal vesicles are unremarkable.  There are bilateral inguinal hernias with a high riding right testicle in the lower inguinal canal.  The left inguinal hernia only contains fat.  IMPRESSION:  1.  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  A dedicated CT angiogram of the abdomen is recommended. 2.  Possible subtle splenic contusions.  Recommend close clinical observation. 3.  Bilateral inguinal hernias.   Original Report Authenticated By: Rudie Meyer, M.D.    Ct Cervical Spine Wo Contrast  06/18/2012  *RADIOLOGY REPORT*  Clinical Data: Larey Seat.  Injured chest.  CT CERVICAL SPINE WITHOUT CONTRAST  Technique:  Multidetector CT imaging of the cervical spine was performed. Multiplanar CT image reconstructions were also generated.  Comparison: None  Findings: The sagittal reformatted images demonstrate normal alignment of the cervical vertebral bodies.  Disc spaces and vertebral bodies are maintained.  No acute bony findings or abnormal prevertebral soft tissue swelling.  The facets are normally aligned.  No facet or laminar fractures are seen. No large disc protrusions.  The neural foramen are patent.  The skull base C1 and C1-C2 articulations are maintained.  The dens is normal.  There are scattered cervical lymph nodes.  The lung apices are clear.  Extensive subcutaneous emphysema noted, left greater than right along with air tracking up along the esophagus and into the space of the neck related to the chest trauma and pneumomediastinum.  IMPRESSION:  1.  Normal alignment and no acute bony  findings. 2.  Extensive air throughout the soft tissues.   Original Report Authenticated By: Rudie Meyer, M.D.    Ct Abdomen Pelvis W Contrast  06/18/2012  *RADIOLOGY REPORT*  Clinical Data:  Larey Seat.  Chest and abdominal trauma.  Pneumothorax.  CT CHEST, ABDOMEN AND PELVIS WITH CONTRAST  Technique:  Multidetector CT imaging of the chest, abdomen and pelvis was performed following the standard protocol during bolus administration of intravenous contrast.  Contrast: 80mL OMNIPAQUE IOHEXOL 300 MG/ML  SOLN  Comparison:  Chest x-ray 06/18/2012.  CT CHEST  Findings:  Examination of the chest wall demonstrates extensive air throughout the deep soft tissues and in the subcutaneous fat mainly along the left chest.  There is also a pneumomediastinum, pneumothorax and air tracking up of the neck.  No chest wall hematoma.  No supraclavicular or axillary mass or adenopathy.  The sternum is intact and the thoracic vertebral bodies are normally aligned.  No definite fractures.  There are numerous left-sided rib fractures with several ribs are fractured and more than one placed consistent with a flail chest. The 3rd through 12th ribs are fractured the left side.  There is also a comminuted fracture of the left scapula with displaced fragments.  The clavicles are intact and both shoulders are intact.  The heart is normal in size.  No pericardial effusion or hematoma. The aorta is normal in  caliber.  No dissection.  No mediastinal hematoma.  The esophagus grossly normal.  Coronary artery calcifications are noted.  There is fairly extensive pneumomediastinum.  Examination of the lung parenchyma demonstrates a left-sided chest tube in place.  There is a small pneumothorax.  Extensive atelectasis and possible left lower lobe pulmonary contusion.  A small left pleural effusion or pleural hematoma is noted.  IMPRESSION:  1.  Numerous left-sided rib fractures involving the 3rd through 12th ribs with several rib fractures in two places  (flail chest). 2.  Pneumothorax, pneumomediastinum and extensive subcutaneous emphysema. 3.  The heart and great vessels are intact.  No mediastinal hematoma. 4.  A small left pleural effusion/pleural hematoma with areas of bilateral atelectasis. 5.  Comminuted left scapular fracture.  CT ABDOMEN AND PELVIS  Findings:  The solid abdominal organs are intact. On the initial phase imaging cannot exclude a small contusion involving the posterior and inferior aspect of the spleen.  The gallbladder is normal.  No common bile duct dilatation.  The stomach, duodenum, small bowel and colon are grossly normal without oral contrast.  No mesenteric or retroperitoneal mass, adenopathy or hematoma.  The aorta is normal in caliber.  Minimal scattered atherosclerotic calcifications.  The origin of the celiac access is abnormal. There is central low attenuation which could reflect clot and there is strandy interstitial change around the vessel.  Findings suspicious for acute short segment dissection.  Recommend dedicated CTA when able.  No lumbar vertebral body fractures or transverse process fractures. The facets are normally aligned.  The bony pelvis is intact.  Mild SI joint degenerative changes.  No pelvic mass or hematoma.  No free pelvic fluid collections.  The bladder, prostate gland and seminal vesicles are unremarkable.  There are bilateral inguinal hernias with a high riding right testicle in the lower inguinal canal.  The left inguinal hernia only contains fat.  IMPRESSION:  1.  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  A dedicated CT angiogram of the abdomen is recommended. 2.  Possible subtle splenic contusions.  Recommend close clinical observation. 3.  Bilateral inguinal hernias.   Original Report Authenticated By: Rudie Meyer, M.D.    Dg Pelvis Portable  06/18/2012  *RADIOLOGY REPORT*  Clinical Data: Trauma  PORTABLE PELVIS  Comparison: None.  Findings: No acute fracture and  no dislocation.  IMPRESSION: No acute bony pathology.   Original Report Authenticated By: Jolaine Click, M.D.    Dg Chest Portable 1 View  06/18/2012  *RADIOLOGY REPORT*  Clinical Data: Chest tube placement, pneumothorax, rib fractures, fell from tree, flail chest  PORTABLE CHEST - 1 VIEW  Comparison: Portable exam 1615 hours compared to 06/18/2012 at 1558 hours  Findings: New left basilar thoracostomy tube. Decreased left pneumothorax. Low lung volumes with scattered infiltrate within left lung. Mild right basilar atelectasis. Multiple left rib fractures again seen. Left chest wall emphysema extending into axilla.  IMPRESSION: Decrease in left pneumothorax following left thoracostomy tube placement. Mild scattered infiltrate in left lung with minimal right basilar atelectasis. Multiple left rib fractures.   Original Report Authenticated By: Ulyses Southward, M.D.    Dg Chest Portable 1 View  06/18/2012  *RADIOLOGY REPORT*  Clinical Data: Fall from tree with flail chest.  PORTABLE CHEST - 1 VIEW  Comparison: None.  Findings: Single supine view of the chest was obtained.  There is lucency along the left lung base and findings are consistent with a deep sulcus sign and pneumothorax.  There  is a large amount of subcutaneous gas.  There are multiple displaced left rib fractures. The entire right side of the chest was not imaged.  Fractures involving the left fourth, fifth, sixth and seventh ribs.  The left fourth and fifth rib fractures may be segmental fractures.  IMPRESSION: Multiple left rib fractures with a pneumothorax.  Large amount of subcutaneous gas.   Original Report Authenticated By: Richarda Overlie, M.D.    Dg Hand Complete Left  06/18/2012  *RADIOLOGY REPORT*  Clinical Data: Injury  LEFT HAND - COMPLETE 3+ VIEW  Comparison: None.  Findings: There is an acute fracture involving the proximal metaphysis of the proximal phalanx of the small finger.  Slight anterior displacement of the distal fragment.  Slight dorsal  angulation.  There is also a spiral fracture involving the distal metaphysis of the third metacarpal with some displacement.  Soft tissue injury of the small finger may be present.  IMPRESSION: Acute fractures involving the proximal phalanx of the small finger and distal third metacarpal.   Original Report Authenticated By: Jolaine Click, M.D.     Review of Systems  All other systems reviewed and are negative.   Blood pressure 110/70, pulse 84, temperature 97.6 F (36.4 C), temperature source Oral, resp. rate 20, SpO2 94.00%. Physical Exam  Constitutional: He is oriented to person, place, and time. He appears well-developed and well-nourished.  HENT:  Head: Normocephalic and atraumatic.  Cardiovascular: Normal rate.   Respiratory: He exhibits tenderness.  Musculoskeletal:       Left hand: He exhibits tenderness, bony tenderness and laceration.  Neurological: He is alert and oriented to person, place, and time.  Skin: Skin is warm.  Psychiatric: He has a normal mood and affect. His behavior is normal. Judgment and thought content normal.    Assessment/Plan: As above  Plan explore and repair as needed left hand  Sumayyah Custodio A 06/18/2012, 5:31 PM

## 2012-06-18 NOTE — Anesthesia Postprocedure Evaluation (Signed)
  Anesthesia Post-op Note  Patient: Christopher Henderson  Procedure(s) Performed: Procedure(s): Irrigation and Debridement with wound exploration Left Hand and percutaneous pinning of left Fifth finger and third metacarpal (Left)  Patient Location: PACU  Anesthesia Type:General  Level of Consciousness: sedated and Patient remains intubated per anesthesia plan  Airway and Oxygen Therapy: Patient remains intubated per anesthesia plan and Patient placed on Ventilator (see vital sign flow sheet for setting)  Post-op Pain: mild  Post-op Assessment: Post-op Vital signs reviewed, Patient's Cardiovascular Status Stable and Respiratory Function Stable  Post-op Vital Signs: stable  Complications: No apparent anesthesia complications

## 2012-06-18 NOTE — Progress Notes (Signed)
Changes made at this time due to sats staying 87-88%. On PC-15 and PEEP +8 SpO2 is 95%.

## 2012-06-18 NOTE — Progress Notes (Signed)
Patient transported from PACU to Surgical ICU on ventilator.  Ventilator hooked up to oxygen and plugged into a red outlet.  AMBU bag is at head of bed.  RT will continue to monitor patient.

## 2012-06-18 NOTE — Consult Note (Addendum)
[facility] [place]  Referred by:  Dr. 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 (Oct 29, 1945) male who presents with chief complaint: fall from tree.  This was cutting branches in a tree when a branch sprang back and kicked him out of the tree.  He thinks he landed on his left side.  His primary complaint is pain in his left chest.  At this point, he denies any abdominal pain.  He normally has no complaints and is athletically active by report.  By the time, I was called he was already diagnosed with a compound fracture of the left hand and a pneumothorax requiring chest tube placement.  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  Past Medical History  HTN  H/o Chipped bone in knee  H/o thumb tendon injury  Past Surgical History  Procedure Laterality Date  . Hand / finger tendon lesion excision Left 2000  . Knee surgery Left 1972   SH  Denies alcohol, tobacco or ilicit drugs  FmH  Father died of smoking complications  Mother had DM  Brother had both smoking and DM complications  Medications  Anti-hypertensive (pt doesn't remember name)  Allergies  Allergen Reactions  . Penicillins Hives    REVIEW OF SYSTEMS:  (Positives checked otherwise negative)  CARDIOVASCULAR:  [x]  chest pain, []  chest pressure, []  palpitations, [x]  shortness of breath when laying flat, [x]  shortness of breath with exertion,  []  pain in feet when walking, []  pain in feet when laying flat, []  history of blood clot in veins (DVT), []  history of phlebitis, []  swelling in legs, []  varicose veins  PULMONARY:  []  productive cough, []  asthma, []  wheezing  NEUROLOGIC:  []  weakness in arms or legs, []  numbness in arms or legs, []  difficulty speaking or slurred speech, []  temporary loss of vision in one eye, []   dizziness  HEMATOLOGIC:  []  bleeding problems, []  problems with blood clotting too easily  MUSCULOSKEL:  [x]  joint pain, []  joint swelling  GASTROINTEST:  []  vomiting blood, []  blood in stool     GENITOURINARY:  []  burning with urination, []  blood in urine  PSYCHIATRIC:  []  history of major depression  INTEGUMENTARY:  []  rashes, []  ulcers  CONSTITUTIONAL:  []  fever, []  chills  PRE-ADM LIVING: [x]  Home, []  Nursing home, []  Homeless  AMB STATUS: [x]  Walking, []  Walking w/ Assistance, []  Wheelchair, [] Bed ridden  For [employer] Use RECENT HEART ATTACK (<6 mon): No  CAD Sx: [x]  No, []  Asx, h/o MI, []  Stable angina, []  Unstable angina  PRIOR CHF: [x]  No, []  Asx, []  Mild, []  Moderate, []  Severe  STRESS TEST: [x]  No, []  Normal, []  + ischemia, []  + MI, []  Both  Physical Examination  Filed Vitals:   06/18/12 1620 06/18/12 1709 06/18/12 1731 06/18/12 1759  BP: 114/70 110/70 111/72   Pulse: 79 84 82   Temp:  97.6 F (36.4 C) 97.8 F (36.6 C)   TempSrc:      Resp: 22 20 17    SpO2: 99% 94% 95% 94%    There is no height or weight on file to calculate BMI.  General: A&O x 3, WD, NAD  Head: [place]/AT  Ear/Nose/Throat: Hearing grossly intact, nares w/o erythema or drainage, oropharynx w/o Erythema/Exudate, Mallampati score: 3  Eyes: PERRLA, EOMI  Neck: Supple  Pulmonary: Sym exp,  good air movt, CTAB, no rales, rhonchi, & wheezing, L CT in place, tenderness through L side, small amount of crepitus  Cardiac: RRR, Nl S1, S2, no Murmurs, rubs or gallops  Vascular: Vessel Right Left  Radial Palpable Faintly Palpable  Ulnar Palpable Not Palpable  Brachial Palpable Faintly Palpable  Carotid Palpable, without bruit Palpable, without bruit  Aorta Not palpable N/A  Femoral Palpable Palpable  Popliteal Not palpable Not palpable  PT Palpable Palpable  DP Palpable Palpable   Gastrointestinal: soft, NTND, -G/R, - HSM, - masses, - CVAT B  Musculoskeletal: M/S 5/5 throughout except LUE  which was not tested due to fracture, Extremities without ischemic changes , abrasions RLE, L hand wrapped in sterile bandages  Neurologic: CN 2-12 intact , Pain and light touch intact in extremities except LUE which was not tested due to fracture, Motor exam as listed above  Psychiatric: Judgment intact, Mood & affect appropriate for pt's clinical situation  Dermatologic: See M/S exam for extremity exam, abrasions and lacerations consistent with mechanism of injury  Lymph : No Cervical, Axillary, or Inguinal lymphadenopathy   Laboratory: CBC:    Component Value Date/Time   WBC 17.6* 06/18/2012 1622   RBC 5.01 06/18/2012 1622   HGB 15.8 06/18/2012 1622   HCT 44.3 06/18/2012 1622   PLT 240 06/18/2012 1622   MCV 88.4 06/18/2012 1622   MCH 31.5 06/18/2012 1622   MCHC 35.7 06/18/2012 1622   RDW 12.1 06/18/2012 1622    BMP:    Component Value Date/Time   NA 138 06/18/2012 1622   K 3.7 06/18/2012 1622   CL 100 06/18/2012 1622   CO2 24 06/18/2012 1622   GLUCOSE 204* 06/18/2012 1622   BUN 13 06/18/2012 1622   CREATININE 1.18 06/18/2012 1622   CALCIUM 9.8 06/18/2012 1622   GFRNONAA 63* 06/18/2012 1622   GFRAA 73* 06/18/2012 1622    Coagulation: Lab Results  Component Value Date   INR 0.99 06/18/2012   No results found for this basename: PTT    Radiology Ct Chest W Contrast  06/18/2012  *RADIOLOGY REPORT*  Clinical Data:  Larey Seat.  Chest and abdominal trauma.  Pneumothorax.  CT CHEST, ABDOMEN AND PELVIS WITH CONTRAST  Technique:  Multidetector CT imaging of the chest, abdomen and pelvis was performed following the standard protocol during bolus administration of intravenous contrast.  Contrast: 80mL OMNIPAQUE IOHEXOL 300 MG/ML  SOLN  Comparison:  Chest x-ray 06/18/2012.  CT CHEST  Findings:  Examination of the chest wall demonstrates extensive air throughout the deep soft tissues and in the subcutaneous fat mainly along the left chest.  There is also a pneumomediastinum, pneumothorax and air  tracking up of the neck.  No chest wall hematoma.  No supraclavicular or axillary mass or adenopathy.  The sternum is intact and the thoracic vertebral bodies are normally aligned.  No definite fractures.  There are numerous left-sided rib fractures with several ribs are fractured and more than one placed consistent with a flail chest. The 3rd through 12th ribs are fractured the left side.  There is also a comminuted fracture of the left scapula with displaced fragments.  The clavicles are intact and both shoulders are intact.  The heart is normal in size.  No pericardial effusion or hematoma. The aorta is normal in caliber.  No dissection.  No mediastinal hematoma.  The esophagus grossly normal.  Coronary artery calcifications are noted.  There is fairly extensive pneumomediastinum.  Examination of the lung parenchyma demonstrates a left-sided  chest tube in place.  There is a small pneumothorax.  Extensive atelectasis and possible left lower lobe pulmonary contusion.  A small left pleural effusion or pleural hematoma is noted.  IMPRESSION:  1.  Numerous left-sided rib fractures involving the 3rd through 12th ribs with several rib fractures in two places (flail chest). 2.  Pneumothorax, pneumomediastinum and extensive subcutaneous emphysema. 3.  The heart and great vessels are intact.  No mediastinal hematoma. 4.  A small left pleural effusion/pleural hematoma with areas of bilateral atelectasis. 5.  Comminuted left scapular fracture.  CT ABDOMEN AND PELVIS  Findings:  The solid abdominal organs are intact. On the initial phase imaging cannot exclude a small contusion involving the posterior and inferior aspect of the spleen.  The gallbladder is normal.  No common bile duct dilatation.  The stomach, duodenum, small bowel and colon are grossly normal without oral contrast.  No mesenteric or retroperitoneal mass, adenopathy or hematoma.  The aorta is normal in caliber.  Minimal scattered atherosclerotic  calcifications.  The origin of the celiac access is abnormal. There is central low attenuation which could reflect clot and there is strandy interstitial change around the vessel.  Findings suspicious for acute short segment dissection.  Recommend dedicated CTA when able.  No lumbar vertebral body fractures or transverse process fractures. The facets are normally aligned.  The bony pelvis is intact.  Mild SI joint degenerative changes.  No pelvic mass or hematoma.  No free pelvic fluid collections.  The bladder, prostate gland and seminal vesicles are unremarkable.  There are bilateral inguinal hernias with a high riding right testicle in the lower inguinal canal.  The left inguinal hernia only contains fat.  IMPRESSION:  1.  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  A dedicated CT angiogram of the abdomen is recommended. 2.  Possible subtle splenic contusions.  Recommend close clinical observation. 3.  Bilateral inguinal hernias.   Original Report Authenticated By: Rudie Meyer, M.D.    Ct Cervical Spine Wo Contrast  06/18/2012  *RADIOLOGY REPORT*  Clinical Data: Larey Seat.  Injured chest.  CT CERVICAL SPINE WITHOUT CONTRAST  Technique:  Multidetector CT imaging of the cervical spine was performed. Multiplanar CT image reconstructions were also generated.  Comparison: None  Findings: The sagittal reformatted images demonstrate normal alignment of the cervical vertebral bodies.  Disc spaces and vertebral bodies are maintained.  No acute bony findings or abnormal prevertebral soft tissue swelling.  The facets are normally aligned.  No facet or laminar fractures are seen. No large disc protrusions.  The neural foramen are patent.  The skull base C1 and C1-C2 articulations are maintained.  The dens is normal.  There are scattered cervical lymph nodes.  The lung apices are clear.  Extensive subcutaneous emphysema noted, left greater than right along with air tracking up along the  esophagus and into the space of the neck related to the chest trauma and pneumomediastinum.  IMPRESSION:  1.  Normal alignment and no acute bony findings. 2.  Extensive air throughout the soft tissues.   Original Report Authenticated By: Rudie Meyer, M.D.    Ct Abdomen Pelvis W Contrast  06/18/2012  *RADIOLOGY REPORT*  Clinical Data:  Larey Seat.  Chest and abdominal trauma.  Pneumothorax.  CT CHEST, ABDOMEN AND PELVIS WITH CONTRAST  Technique:  Multidetector CT imaging of the chest, abdomen and pelvis was performed following the standard protocol during bolus administration of intravenous contrast.  Contrast: 80mL OMNIPAQUE IOHEXOL 300 MG/ML  SOLN  Comparison:  Chest x-ray 06/18/2012.  CT CHEST  Findings:  Examination of the chest wall demonstrates extensive air throughout the deep soft tissues and in the subcutaneous fat mainly along the left chest.  There is also a pneumomediastinum, pneumothorax and air tracking up of the neck.  No chest wall hematoma.  No supraclavicular or axillary mass or adenopathy.  The sternum is intact and the thoracic vertebral bodies are normally aligned.  No definite fractures.  There are numerous left-sided rib fractures with several ribs are fractured and more than one placed consistent with a flail chest. The 3rd through 12th ribs are fractured the left side.  There is also a comminuted fracture of the left scapula with displaced fragments.  The clavicles are intact and both shoulders are intact.  The heart is normal in size.  No pericardial effusion or hematoma. The aorta is normal in caliber.  No dissection.  No mediastinal hematoma.  The esophagus grossly normal.  Coronary artery calcifications are noted.  There is fairly extensive pneumomediastinum.  Examination of the lung parenchyma demonstrates a left-sided chest tube in place.  There is a small pneumothorax.  Extensive atelectasis and possible left lower lobe pulmonary contusion.  A small left pleural effusion or pleural  hematoma is noted.  IMPRESSION:  1.  Numerous left-sided rib fractures involving the 3rd through 12th ribs with several rib fractures in two places (flail chest). 2.  Pneumothorax, pneumomediastinum and extensive subcutaneous emphysema. 3.  The heart and great vessels are intact.  No mediastinal hematoma. 4.  A small left pleural effusion/pleural hematoma with areas of bilateral atelectasis. 5.  Comminuted left scapular fracture.  CT ABDOMEN AND PELVIS  Findings:  The solid abdominal organs are intact. On the initial phase imaging cannot exclude a small contusion involving the posterior and inferior aspect of the spleen.  The gallbladder is normal.  No common bile duct dilatation.  The stomach, duodenum, small bowel and colon are grossly normal without oral contrast.  No mesenteric or retroperitoneal mass, adenopathy or hematoma.  The aorta is normal in caliber.  Minimal scattered atherosclerotic calcifications.  The origin of the celiac access is abnormal. There is central low attenuation which could reflect clot and there is strandy interstitial change around the vessel.  Findings suspicious for acute short segment dissection.  Recommend dedicated CTA when able.  No lumbar vertebral body fractures or transverse process fractures. The facets are normally aligned.  The bony pelvis is intact.  Mild SI joint degenerative changes.  No pelvic mass or hematoma.  No free pelvic fluid collections.  The bladder, prostate gland and seminal vesicles are unremarkable.  There are bilateral inguinal hernias with a high riding right testicle in the lower inguinal canal.  The left inguinal hernia only contains fat.  IMPRESSION:  1.  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  A dedicated CT angiogram of the abdomen is recommended. 2.  Possible subtle splenic contusions.  Recommend close clinical observation. 3.  Bilateral inguinal hernias.   Original Report Authenticated By: Rudie Meyer, M.D.     Dg Pelvis Portable  06/18/2012  *RADIOLOGY REPORT*  Clinical Data: Trauma  PORTABLE PELVIS  Comparison: None.  Findings: No acute fracture and no dislocation.  IMPRESSION: No acute bony pathology.   Original Report Authenticated By: Jolaine Click, M.D.    Dg Chest Portable 1 View  06/18/2012  *RADIOLOGY REPORT*  Clinical Data: Chest tube placement, pneumothorax, rib fractures, fell from tree,  flail chest  PORTABLE CHEST - 1 VIEW  Comparison: Portable exam 1615 hours compared to 06/18/2012 at 1558 hours  Findings: New left basilar thoracostomy tube. Decreased left pneumothorax. Low lung volumes with scattered infiltrate within left lung. Mild right basilar atelectasis. Multiple left rib fractures again seen. Left chest wall emphysema extending into axilla.  IMPRESSION: Decrease in left pneumothorax following left thoracostomy tube placement. Mild scattered infiltrate in left lung with minimal right basilar atelectasis. Multiple left rib fractures.   Original Report Authenticated By: Ulyses Southward, M.D.    Dg Chest Portable 1 View  06/18/2012  *RADIOLOGY REPORT*  Clinical Data: Fall from tree with flail chest.  PORTABLE CHEST - 1 VIEW  Comparison: None.  Findings: Single supine view of the chest was obtained.  There is lucency along the left lung base and findings are consistent with a deep sulcus sign and pneumothorax.  There is a large amount of subcutaneous gas.  There are multiple displaced left rib fractures. The entire right side of the chest was not imaged.  Fractures involving the left fourth, fifth, sixth and seventh ribs.  The left fourth and fifth rib fractures may be segmental fractures.  IMPRESSION: Multiple left rib fractures with a pneumothorax.  Large amount of subcutaneous gas.   Original Report Authenticated By: Richarda Overlie, M.D.    Dg Hand Complete Left  06/18/2012  *RADIOLOGY REPORT*  Clinical Data: Injury  LEFT HAND - COMPLETE 3+ VIEW  Comparison: None.  Findings: There is an acute fracture  involving the proximal metaphysis of the proximal phalanx of the small finger.  Slight anterior displacement of the distal fragment.  Slight dorsal angulation.  There is also a spiral fracture involving the distal metaphysis of the third metacarpal with some displacement.  Soft tissue injury of the small finger may be present.  IMPRESSION: Acute fractures involving the proximal phalanx of the small finger and distal third metacarpal.   Original Report Authenticated By: Jolaine Click, M.D.    I reviewed the CT Abd/pelvis.  There is a fullness in the proximal celiac artery and a tiny filling defect in the lumen distal to the fullness.  Unfortunately, the slices are adequate to make too much of a conclusion yet.  I agree a CTA abd/pelvis will be needed to evaluate the celiac artery.  I do not see evidence of aortic dissection or any visceral organ malperfusion that would be present if the celiac artery was compromised.  The SMA appears to be widely patent without any signs of injury.  Medical Decision Making  Kamryn Gauthier is a 67 y.o. 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   Further evaluation with CTA abd/pelvis already planned per discussion with Dr. Lindie Spruce  Patient is going immediately to surgery for the L hand.  I don't think anti-coagulation is a necessity given the benign exam this patient has at this point.    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  We will continue to follow this patient with you.  Thank you for allowing [place] to participate in this patient's care.  Leonides Sake, MD [facility] Office: [phone] Pager: [phone]  06/18/2012, 6:32 PM

## 2012-06-18 NOTE — Progress Notes (Signed)
Orthopedic Tech Progress Note Patient Details:  Bracken Moffa Jun 05, 1945 [id number]  Patient ID: Colonel Bald, male   DOB: 14-Apr-1945, 67 y.o.   MRN: [id number] Made level 1 trauma visit  Nikki Dom 06/18/2012, 4:22 PM

## 2012-06-18 NOTE — ED Notes (Signed)
Weingold MD at bedside

## 2012-06-18 NOTE — H&P (Signed)
Christopher Henderson is an 67 y.o. male.   Chief Complaint:  Fall from tree HPI: 67 y/o male presents as a level 1 trauma who was cutting a tree limb, lost balance and fell approximately 10 feet.  The patient denies loss of consciousness.  The patient is complaining of left sided chest pain, shortness of breath, and left hand pain. He sustained a laceration to his left hand and an abrasion to his right lower extremity.  He was bought in by EMS on his right side, was visibly in moderate respiratory distress and tachycardic.  Small flail segment noted over left anterior chest.  Workup showed:  Left pneumothorax and multiple rib fractures.  WBC was 17.6.  A left 40 french chest tube was placed with good result.  Left hand laceration with tendons exposed, right shin abrasion.   No past medical history on file.  Hypertension  No past surgical history on file.  No family history on file. Social History:  has no tobacco, alcohol, and drug history on file.  Allergies: No Known Allergies   (Not in a hospital admission)  Results for orders placed during the hospital encounter of 06/18/12 (from the past 48 hour(s))  TYPE AND SCREEN     Status: None   Collection Time    06/18/12  3:50 PM      Result Value Range   ABO/RH(D) A POS     Antibody Screen PENDING     Sample Expiration 06/21/2012     Unit Number [id number]     Blood Component Type RED CELLS,LR     Unit division 00     Status of Unit ISSUED     Unit tag comment VERBAL ORDERS PER DR LOCKWOOD     Transfusion Status OK TO TRANSFUSE     Crossmatch Result PENDING     Unit Number [id number]     Blood Component Type RED CELLS,LR     Unit division 00     Status of Unit ISSUED     Unit tag comment VERBAL ORDERS PER DR LOCKWOOD     Transfusion Status OK TO TRANSFUSE     Crossmatch Result PENDING    POCT I-STAT, CHEM 8     Status: Abnormal   Collection Time    06/18/12  4:07 PM      Result Value Range   Sodium 139  135 - 145 mEq/L   Potassium 3.5  3.5 - 5.1 mEq/L   Chloride 103  96 - 112 mEq/L   BUN 13  6 - 23 mg/dL   Creatinine, Ser 0.27 (*) 0.50 - 1.35 mg/dL   Glucose, Bld 253 (*) 70 - 99 mg/dL   Calcium, Ion 6.64  4.03 - 1.30 mmol/L   TCO2 24  0 - 100 mmol/L   Hemoglobin 16.0  13.0 - 17.0 g/dL   HCT 47.4  25.9 - 56.3 %   Dg Pelvis Portable  06/18/2012  *RADIOLOGY REPORT*  Clinical Data: Trauma  PORTABLE PELVIS  Comparison: None.  Findings: No acute fracture and no dislocation.  IMPRESSION: No acute bony pathology.   Original Report Authenticated By: Jolaine Click, M.D.    Dg Chest Portable 1 View  06/18/2012  *RADIOLOGY REPORT*  Clinical Data: Fall from tree with flail chest.  PORTABLE CHEST - 1 VIEW  Comparison: None.  Findings: Single supine view of the chest was obtained.  There is lucency along the left lung base and findings are consistent with a deep sulcus sign and pneumothorax.  There is a large amount of subcutaneous gas.  There are multiple displaced left rib fractures. The entire right side of the chest was not imaged.  Fractures involving the left fourth, fifth, sixth and seventh ribs.  The left fourth and fifth rib fractures may be segmental fractures.  IMPRESSION: Multiple left rib fractures with a pneumothorax.  Large amount of subcutaneous gas.   Original Report Authenticated By: Richarda Overlie, M.D.     Review of Systems  Respiratory: Positive for cough and shortness of breath.   Cardiovascular: Positive for chest pain.  Gastrointestinal: Negative for abdominal pain.  Musculoskeletal: Positive for myalgias (left hand).    Blood pressure 114/70, pulse 79, temperature 97.7 F (36.5 C), temperature source Oral, resp. rate 22, SpO2 99.00%. Physical Exam  Constitutional: He is oriented to person, place, and time. He appears well-developed and well-nourished.  HENT:  Head: Normocephalic and atraumatic.  Eyes: Conjunctivae and EOM are normal. Pupils are equal, round, and reactive to light.  Neck: No edema  present.  Cardiovascular: Normal rate and regular rhythm.  Exam reveals no gallop and no friction rub.   No murmur heard. Respiratory: Accessory muscle usage present. Tachypnea noted. He is in respiratory distress. He exhibits tenderness (on left).  Decreased on left, small flail segment on left anterior    GI: He exhibits no distension. There is no tenderness. There is no rebound and no guarding.  Musculoskeletal:  Right shin superficial abrasion from knee to top of foot; left hand large irregular shaped laceration with tendons visible.  Edema to the fingers, ring finger with ring which was removed.  Left hand strength intact in flexion/extension.  Neurological: He is alert and oriented to person, place, and time. GCS eye subscore is 4. GCS verbal subscore is 5. GCS motor subscore is 6.  Skin: Skin is warm and dry.  Psychiatric: He has a normal mood and affect. His behavior is normal.     Assessment/Plan S/P 32ft fall from tree Left Pneumothorax s/p chest tube placement Multiple left rib fractures with small flail segment Right shin abrasion Left laceration with tendons exposed 1.  Admit to trauma service 2.  IVF, pain control, antiemetics 3.  Pulmonary toilet, SCD's, lovenox when scans clear 4.  Will need PT/OT 5.  Consult Dr. Mina Marble from hand surgery 6.  Pending CT scans and xrays.   [name] 06/18/2012, 4:40 PM

## 2012-06-18 NOTE — Op Note (Signed)
See dictated note [phone]

## 2012-06-18 NOTE — Progress Notes (Signed)
DR. Andrey Campanile called informed patient is going to remain on ventilator going to [id number] asked for orders for restraints and would he like a new NGT . Dr. Henrine Screws he would right orders

## 2012-06-18 NOTE — Transfer of Care (Signed)
Immediate Anesthesia Transfer of Care Note  Patient: Christopher Henderson  Procedure(s) Performed: Procedure(s): Irrigation and Debridement with wound exploration Left Hand and percutaneous pinning of left Fifth finger and third metacarpal (Left)  Patient Location: PACU  Anesthesia Type:General  Level of Consciousness: sedated and Patient remains intubated per anesthesia plan  Airway & Oxygen Therapy: Patient Spontanous Breathing and Patient placed on Ventilator (see vital sign flow sheet for setting)  Post-op Assessment: Report given to PACU RN  Post vital signs: Reviewed and stable  Complications: respiratory complications and Pt. with borderline SaO2 during case; Discussed with Dr. [name]; plan to take to PACU intubated

## 2012-06-18 NOTE — Anesthesia Preprocedure Evaluation (Addendum)
Anesthesia Evaluation  Patient identified by MRN, date of birth, ID band Patient awake    Reviewed: Allergy & Precautions, H&P , NPO status   History of Anesthesia Complications Negative for: history of anesthetic complications  Airway Mallampati: II  Neck ROM: Full    Dental  (+) Teeth Intact, Caps and Dental Advisory Given   Pulmonary  Pulm contusion and rib fractures   + decreased breath sounds      Cardiovascular hypertension, Pt. on medications Rhythm:Regular Rate:Normal     Neuro/Psych negative neurological ROS     GI/Hepatic negative GI ROS, Neg liver ROS,   Endo/Other  negative endocrine ROS  Renal/GU negative Renal ROS     Musculoskeletal   Abdominal (+) + obese,   Peds  Hematology   Anesthesia Other Findings   Reproductive/Obstetrics                          Anesthesia Physical Anesthesia Plan  ASA: II and emergent  Anesthesia Plan: General   Post-op Pain Management:    Induction: Intravenous  Airway Management Planned: Oral ETT  Additional Equipment:   Intra-op Plan:   Post-operative Plan: Extubation in OR  Informed Consent: I have reviewed the patients History and Physical, chart, labs and discussed the procedure including the risks, benefits and alternatives for the proposed anesthesia with the patient or authorized representative who has indicated his/her understanding and acceptance.   Dental advisory given  Plan Discussed with: CRNA and Surgeon  Anesthesia Plan Comments:         Anesthesia Quick Evaluation

## 2012-06-18 NOTE — ED Provider Notes (Signed)
History     CSN: [id number]  Arrival date & time 06/18/12  1546   First MD Initiated Contact with Patient 06/18/12 1554     Chief Complaint  Patient presents with  . Trauma   HPI  67 y/o male who presents as a level I trauma code after falling from a tree. The patient is complaining of left sided chest pain. He sustained a laceration to his left hand and an abrasion to his right lower extremity. The patient denies loss of consciousness.   History reviewed. No pertinent past medical history.  Past Surgical History  Procedure Laterality Date  . Hand / finger tendon lesion excision Left 2000  . Knee surgery Left 1972    No family history on file.  History  Substance Use Topics  . Smoking status: Not on file  . Smokeless tobacco: Not on file  . Alcohol Use: Not on file    Review of Systems  Constitutional: Negative for fever and chills.  Respiratory: Positive for shortness of breath.   Cardiovascular: Positive for chest pain.  Gastrointestinal: Negative for nausea, vomiting and abdominal pain.  All other systems reviewed and are negative.   Allergies  Penicillins  Home Medications  No current outpatient prescriptions on file.  BP 86/49  Pulse 86  Temp(Src) 98.5 F (36.9 C) (Oral)  Resp 15  Ht 6' (1.829 m)  Wt 235 lb (106.595 kg)  BMI 31.86 kg/m2  SpO2 98%  Physical Exam  Nursing note and vitals reviewed. Constitutional: He is oriented to person, place, and time. He appears well-developed and well-nourished. No distress.  HENT:  Head: Normocephalic and atraumatic.  Mouth/Throat: No oropharyngeal exudate.  Eyes: Conjunctivae are normal. Pupils are equal, round, and reactive to light.  Cardiovascular: Regular rhythm.  Tachycardia present.   Pulmonary/Chest: He exhibits tenderness, bony tenderness and crepitus.    Flail chest  Abdominal: Soft. He exhibits no distension. There is no tenderness.  Musculoskeletal:       Hands: Large laceration to palmar  aspect of left hand. Large abrasion down right shin.   Neurological: He is alert and oriented to person, place, and time.  Skin: Skin is warm and dry.  Psychiatric: He has a normal mood and affect.    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 (*)    All other components within normal limits  COMPREHENSIVE METABOLIC PANEL - Abnormal; Notable for the following:    Glucose, Bld 204 (*)    AST 51 (*)    GFR calc non Af Amer 63 (*)    GFR calc Af Amer 73 (*)    All other components within normal limits  CBC - Abnormal; Notable for the following:    WBC 17.6 (*)    All other components within normal limits  BLOOD GAS, ARTERIAL - Abnormal; Notable for the following:    pH, Arterial 7.311 (*)    pCO2 arterial 45.4 (*)    pO2, Arterial 73.3 (*)    Acid-base deficit 3.0 (*)    All other components within normal limits  POCT I-STAT, CHEM 8 - Abnormal; Notable for the following:    Creatinine, Ser 1.40 (*)    Glucose, Bld 207 (*)    All other components within normal limits  MRSA PCR SCREENING  CDS SEROLOGY  PROTIME-INR  URINALYSIS, MICROSCOPIC ONLY  CBC  BASIC METABOLIC PANEL  BLOOD GAS, ARTERIAL  TYPE AND SCREEN  ABO/RH   Ct Chest W Contrast  06/18/2012  *RADIOLOGY REPORT*  Clinical Data:  Larey Seat.  Chest and abdominal trauma.  Pneumothorax.  CT CHEST, ABDOMEN AND PELVIS WITH CONTRAST  Technique:  Multidetector CT imaging of the chest, abdomen and pelvis was performed following the standard protocol during bolus administration of intravenous contrast.  Contrast: 80mL OMNIPAQUE IOHEXOL 300 MG/ML  SOLN  Comparison:  Chest x-ray 06/18/2012.  CT CHEST  Findings:  Examination of the chest wall demonstrates extensive air throughout the deep soft tissues and in the subcutaneous fat mainly along the left chest.  There is also a pneumomediastinum, pneumothorax and air tracking up of the  neck.  No chest wall hematoma.  No supraclavicular or axillary mass or adenopathy.  The sternum is intact and the thoracic vertebral bodies are normally aligned.  No definite fractures.  There are numerous left-sided rib fractures with several ribs are fractured and more than one placed consistent with a flail chest. The 3rd through 12th ribs are fractured the left side.  There is also a comminuted fracture of the left scapula with displaced fragments.  The clavicles are intact and both shoulders are intact.  The heart is normal in size.  No pericardial effusion or hematoma. The aorta is normal in caliber.  No dissection.  No mediastinal hematoma.  The esophagus grossly normal.  Coronary artery calcifications are noted.  There is fairly extensive pneumomediastinum.  Examination of the lung parenchyma demonstrates a left-sided chest tube in place.  There is a small pneumothorax.  Extensive atelectasis and possible left lower lobe pulmonary contusion.  A small left pleural effusion or pleural hematoma is noted.  IMPRESSION:  1.  Numerous left-sided rib fractures involving the 3rd through 12th ribs with several rib fractures in two places (flail chest). 2.  Pneumothorax, pneumomediastinum and extensive subcutaneous emphysema. 3.  The heart and great vessels are intact.  No mediastinal hematoma. 4.  A small left pleural effusion/pleural hematoma with areas of bilateral atelectasis. 5.  Comminuted left scapular fracture.  CT ABDOMEN AND PELVIS  Findings:  The solid abdominal organs are intact. On the initial phase imaging cannot exclude a small contusion involving the posterior and inferior aspect of the spleen.  The gallbladder is normal.  No common bile duct dilatation.  The stomach, duodenum, small bowel and colon are grossly normal without oral contrast.  No mesenteric or retroperitoneal mass, adenopathy or hematoma.  The aorta is normal in caliber.  Minimal scattered atherosclerotic calcifications.  The origin of  the celiac access is abnormal. There is central low attenuation which could reflect clot and there is strandy interstitial change around the vessel.  Findings suspicious for acute short segment dissection.  Recommend dedicated CTA when able.  No lumbar vertebral body fractures or transverse process fractures. The facets are normally aligned.  The bony pelvis is intact.  Mild SI joint degenerative changes.  No pelvic mass or hematoma.  No free pelvic fluid collections.  The bladder, prostate gland and seminal vesicles are unremarkable.  There are bilateral inguinal hernias with a high riding right testicle in the lower inguinal canal.  The left inguinal hernia only contains fat.  IMPRESSION:  1.  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  A dedicated CT angiogram of the abdomen is recommended. 2.  Possible subtle splenic contusions.  Recommend close clinical observation. 3.  Bilateral inguinal hernias.   Original Report Authenticated By:  Rudie Meyer, M.D.    Ct Cervical Spine Wo Contrast  06/18/2012  *RADIOLOGY REPORT*  Clinical Data: Larey Seat.  Injured chest.  CT CERVICAL SPINE WITHOUT CONTRAST  Technique:  Multidetector CT imaging of the cervical spine was performed. Multiplanar CT image reconstructions were also generated.  Comparison: None  Findings: The sagittal reformatted images demonstrate normal alignment of the cervical vertebral bodies.  Disc spaces and vertebral bodies are maintained.  No acute bony findings or abnormal prevertebral soft tissue swelling.  The facets are normally aligned.  No facet or laminar fractures are seen. No large disc protrusions.  The neural foramen are patent.  The skull base C1 and C1-C2 articulations are maintained.  The dens is normal.  There are scattered cervical lymph nodes.  The lung apices are clear.  Extensive subcutaneous emphysema noted, left greater than right along with air tracking up along the esophagus and into the space of the  neck related to the chest trauma and pneumomediastinum.  IMPRESSION:  1.  Normal alignment and no acute bony findings. 2.  Extensive air throughout the soft tissues.   Original Report Authenticated By: Rudie Meyer, M.D.    Ct Abdomen Pelvis W Contrast  06/18/2012  *RADIOLOGY REPORT*  Clinical Data:  Larey Seat.  Chest and abdominal trauma.  Pneumothorax.  CT CHEST, ABDOMEN AND PELVIS WITH CONTRAST  Technique:  Multidetector CT imaging of the chest, abdomen and pelvis was performed following the standard protocol during bolus administration of intravenous contrast.  Contrast: 80mL OMNIPAQUE IOHEXOL 300 MG/ML  SOLN  Comparison:  Chest x-ray 06/18/2012.  CT CHEST  Findings:  Examination of the chest wall demonstrates extensive air throughout the deep soft tissues and in the subcutaneous fat mainly along the left chest.  There is also a pneumomediastinum, pneumothorax and air tracking up of the neck.  No chest wall hematoma.  No supraclavicular or axillary mass or adenopathy.  The sternum is intact and the thoracic vertebral bodies are normally aligned.  No definite fractures.  There are numerous left-sided rib fractures with several ribs are fractured and more than one placed consistent with a flail chest. The 3rd through 12th ribs are fractured the left side.  There is also a comminuted fracture of the left scapula with displaced fragments.  The clavicles are intact and both shoulders are intact.  The heart is normal in size.  No pericardial effusion or hematoma. The aorta is normal in caliber.  No dissection.  No mediastinal hematoma.  The esophagus grossly normal.  Coronary artery calcifications are noted.  There is fairly extensive pneumomediastinum.  Examination of the lung parenchyma demonstrates a left-sided chest tube in place.  There is a small pneumothorax.  Extensive atelectasis and possible left lower lobe pulmonary contusion.  A small left pleural effusion or pleural hematoma is noted.  IMPRESSION:  1.   Numerous left-sided rib fractures involving the 3rd through 12th ribs with several rib fractures in two places (flail chest). 2.  Pneumothorax, pneumomediastinum and extensive subcutaneous emphysema. 3.  The heart and great vessels are intact.  No mediastinal hematoma. 4.  A small left pleural effusion/pleural hematoma with areas of bilateral atelectasis. 5.  Comminuted left scapular fracture.  CT ABDOMEN AND PELVIS  Findings:  The solid abdominal organs are intact. On the initial phase imaging cannot exclude a small contusion involving the posterior and inferior aspect of the spleen.  The gallbladder is normal.  No common bile duct dilatation.  The stomach, duodenum, small bowel and colon are grossly  normal without oral contrast.  No mesenteric or retroperitoneal mass, adenopathy or hematoma.  The aorta is normal in caliber.  Minimal scattered atherosclerotic calcifications.  The origin of the celiac access is abnormal. There is central low attenuation which could reflect clot and there is strandy interstitial change around the vessel.  Findings suspicious for acute short segment dissection.  Recommend dedicated CTA when able.  No lumbar vertebral body fractures or transverse process fractures. The facets are normally aligned.  The bony pelvis is intact.  Mild SI joint degenerative changes.  No pelvic mass or hematoma.  No free pelvic fluid collections.  The bladder, prostate gland and seminal vesicles are unremarkable.  There are bilateral inguinal hernias with a high riding right testicle in the lower inguinal canal.  The left inguinal hernia only contains fat.  IMPRESSION:  1.  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  A dedicated CT angiogram of the abdomen is recommended. 2.  Possible subtle splenic contusions.  Recommend close clinical observation. 3.  Bilateral inguinal hernias.   Original Report Authenticated By: Rudie Meyer, M.D.    Dg Pelvis Portable  06/18/2012   *RADIOLOGY REPORT*  Clinical Data: Trauma  PORTABLE PELVIS  Comparison: None.  Findings: No acute fracture and no dislocation.  IMPRESSION: No acute bony pathology.   Original Report Authenticated By: Jolaine Click, M.D.    Dg Chest Port 1 View  06/18/2012  *RADIOLOGY REPORT*  Clinical Data: Low oxygen stats during surgery.  Status post intubation.  PORTABLE CHEST - 1 VIEW  Comparison: 06/18/2012.  Findings: Endotracheal tube tip is 33 mm from the carina. Nasogastric tube is in the stomach with the tip not visualized. Left thoracostomy tube is present with left chest wall emphysema. The tip of the thoracostomy tube is adjacent to the right hilum. Lung volumes are markedly low, with bilateral basilar and right midlung perihilar subsegmental atelectasis.  The retrocardiac density is present, also likely representing atelectasis.  There is no definite left pneumothorax following thoracostomy tube placement.  Left-sided rib fractures are again noted.  IMPRESSION:  1.  Support apparatus appears in good position. 2.  No left-sided pneumothorax following left thoracostomy tube placement. 3.  Markedly low lung volumes with basilar and perihilar atelectasis.   Original Report Authenticated By: Andreas Newport, M.D.    Dg Chest Portable 1 View  06/18/2012  *RADIOLOGY REPORT*  Clinical Data: Chest tube placement, pneumothorax, rib fractures, fell from tree, flail chest  PORTABLE CHEST - 1 VIEW  Comparison: Portable exam 1615 hours compared to 06/18/2012 at 1558 hours  Findings: New left basilar thoracostomy tube. Decreased left pneumothorax. Low lung volumes with scattered infiltrate within left lung. Mild right basilar atelectasis. Multiple left rib fractures again seen. Left chest wall emphysema extending into axilla.  IMPRESSION: Decrease in left pneumothorax following left thoracostomy tube placement. Mild scattered infiltrate in left lung with minimal right basilar atelectasis. Multiple left rib fractures.   Original  Report Authenticated By: Ulyses Southward, M.D.    Dg Chest Portable 1 View  06/18/2012  *RADIOLOGY REPORT*  Clinical Data: Fall from tree with flail chest.  PORTABLE CHEST - 1 VIEW  Comparison: None.  Findings: Single supine view of the chest was obtained.  There is lucency along the left lung base and findings are consistent with a deep sulcus sign and pneumothorax.  There is a large amount of subcutaneous gas.  There are multiple displaced left rib fractures. The entire right side of the chest  was not imaged.  Fractures involving the left fourth, fifth, sixth and seventh ribs.  The left fourth and fifth rib fractures may be segmental fractures.  IMPRESSION: Multiple left rib fractures with a pneumothorax.  Large amount of subcutaneous gas.   Original Report Authenticated By: Richarda Overlie, M.D.    Dg Hand Complete Left  06/18/2012  *RADIOLOGY REPORT*  Clinical Data: Injury  LEFT HAND - COMPLETE 3+ VIEW  Comparison: None.  Findings: There is an acute fracture involving the proximal metaphysis of the proximal phalanx of the small finger.  Slight anterior displacement of the distal fragment.  Slight dorsal angulation.  There is also a spiral fracture involving the distal metaphysis of the third metacarpal with some displacement.  Soft tissue injury of the small finger may be present.  IMPRESSION: Acute fractures involving the proximal phalanx of the small finger and distal third metacarpal.   Original Report Authenticated By: Jolaine Click, M.D.    Clinical Impression: Fall Left Pneumothorax Multiple rib fractures Left Scapula fracture  MDM  67 y/o male who presents as a level I trauma code after falling from a tree. HDS on arrival. ABC's intact. CXR with large left sided pneumothorax with several broken ribs. Chest tube placed by trauma surgery attending. The patient went to the CT scanner in HDS condition. The patient was subsequently admitted to trauma surgery.         Shanon Ace, MD 06/19/12  0111

## 2012-06-18 NOTE — Anesthesia Procedure Notes (Signed)
Procedure Name: Intubation Date/Time: 06/18/2012 6:40 PM Performed by: Jefm Miles E Pre-anesthesia Checklist: Patient identified, Timeout performed, Emergency Drugs available, Suction available and Patient being monitored Patient Re-evaluated:Patient Re-evaluated prior to inductionOxygen Delivery Method: Circle system utilized Preoxygenation: Pre-oxygenation with 100% oxygen Intubation Type: IV induction and Rapid sequence Laryngoscope Size: Mac and 3 Grade View: Grade III Tube type: Oral Number of attempts: 1 Airway Equipment and Method: Stylet Placement Confirmation: positive ETCO2,  breath sounds checked- equal and bilateral and ETT inserted through vocal cords under direct vision Secured at: 23 cm Tube secured with: Tape Dental Injury: Teeth and Oropharynx as per pre-operative assessment

## 2012-06-18 NOTE — H&P (Signed)
Ct demonstrates a shattered left scapula, ribs 3-9 fractured with multiple segments in the lower ribs.  Mostly resolved left PTX.  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  The patient has minimal abdominal wall injury and no abdominal pain.  The CT of the abdomen is otherwise normal.  Will get a vascular surgery consultation and CTA of the celiac artery.  Have consulted orthopedic surgery about scapular fracture.  Marta Lamas. Gae Bon, MD, FACS [phone] Trauma Surgeon

## 2012-06-19 ENCOUNTER — Inpatient Hospital Stay (HOSPITAL_COMMUNITY): Payer: Medicare Other

## 2012-06-19 ENCOUNTER — Encounter (HOSPITAL_COMMUNITY): Payer: Self-pay | Admitting: Radiology

## 2012-06-19 DIAGNOSIS — D62 Acute posthemorrhagic anemia: Secondary | ICD-10-CM | POA: Diagnosis not present

## 2012-06-19 DIAGNOSIS — J9600 Acute respiratory failure, unspecified whether with hypoxia or hypercapnia: Secondary | ICD-10-CM | POA: Diagnosis not present

## 2012-06-19 DIAGNOSIS — S62308B Unspecified fracture of other metacarpal bone, initial encounter for open fracture: Secondary | ICD-10-CM

## 2012-06-19 DIAGNOSIS — S2242XA Multiple fractures of ribs, left side, initial encounter for closed fracture: Secondary | ICD-10-CM

## 2012-06-19 DIAGNOSIS — W19XXXA Unspecified fall, initial encounter: Secondary | ICD-10-CM

## 2012-06-19 DIAGNOSIS — S36039A Unspecified laceration of spleen, initial encounter: Secondary | ICD-10-CM

## 2012-06-19 DIAGNOSIS — S2249XA Multiple fractures of ribs, unspecified side, initial encounter for closed fracture: Secondary | ICD-10-CM

## 2012-06-19 DIAGNOSIS — S272XXA Traumatic hemopneumothorax, initial encounter: Secondary | ICD-10-CM

## 2012-06-19 DIAGNOSIS — S62607B Fracture of unspecified phalanx of left little finger, initial encounter for open fracture: Secondary | ICD-10-CM

## 2012-06-19 DIAGNOSIS — I10 Essential (primary) hypertension: Secondary | ICD-10-CM | POA: Insufficient documentation

## 2012-06-19 DIAGNOSIS — J95821 Acute postprocedural respiratory failure: Secondary | ICD-10-CM

## 2012-06-19 DIAGNOSIS — S61419A Laceration without foreign body of unspecified hand, initial encounter: Secondary | ICD-10-CM

## 2012-06-19 LAB — BLOOD GAS, ARTERIAL
Acid-base deficit: 2.6 mmol/L — ABNORMAL HIGH (ref 0.0–2.0)
Bicarbonate: 22 mEq/L (ref 20.0–24.0)
FIO2: 0.6 %
O2 Saturation: 96.2 %
pO2, Arterial: 73 mmHg — ABNORMAL LOW (ref 80.0–100.0)

## 2012-06-19 LAB — CDS SEROLOGY

## 2012-06-19 LAB — URINALYSIS, MICROSCOPIC ONLY
Bilirubin Urine: NEGATIVE
Ketones, ur: NEGATIVE mg/dL
Nitrite: NEGATIVE
pH: 5 (ref 5.0–8.0)

## 2012-06-19 LAB — BASIC METABOLIC PANEL
BUN: 15 mg/dL (ref 6–23)
CO2: 23 mEq/L (ref 19–32)
Calcium: 8.3 mg/dL — ABNORMAL LOW (ref 8.4–10.5)
Chloride: 107 mEq/L (ref 96–112)
Creatinine, Ser: 1.08 mg/dL (ref 0.50–1.35)

## 2012-06-19 LAB — CBC
HCT: 37.8 % — ABNORMAL LOW (ref 39.0–52.0)
MCH: 30.5 pg (ref 26.0–34.0)
MCV: 90 fL (ref 78.0–100.0)
Platelets: 145 10*3/uL — ABNORMAL LOW (ref 150–400)
RDW: 12.4 % (ref 11.5–15.5)
WBC: 8.4 10*3/uL (ref 4.0–10.5)

## 2012-06-19 LAB — MRSA PCR SCREENING: MRSA by PCR: POSITIVE — AB

## 2012-06-19 MED ORDER — SELENIUM 50 MCG PO TABS
200.0000 ug | ORAL_TABLET | Freq: Every day | ORAL | Status: DC
  Administered 2012-06-19 – 2012-06-25 (×4): 200 ug
  Filled 2012-06-19 (×8): qty 4

## 2012-06-19 MED ORDER — VITAMIN C 500 MG PO TABS
1000.0000 mg | ORAL_TABLET | Freq: Three times a day (TID) | ORAL | Status: AC
  Administered 2012-06-19 – 2012-06-25 (×12): 1000 mg
  Filled 2012-06-19 (×23): qty 2

## 2012-06-19 MED ORDER — CHLORHEXIDINE GLUCONATE CLOTH 2 % EX PADS
6.0000 | MEDICATED_PAD | Freq: Every morning | CUTANEOUS | Status: DC
  Administered 2012-06-19 – 2012-06-23 (×5): 6 via TOPICAL

## 2012-06-19 MED ORDER — ADULT MULTIVITAMIN LIQUID CH
5.0000 mL | Freq: Every day | ORAL | Status: DC
  Administered 2012-06-19 – 2012-06-21 (×3): 5 mL
  Filled 2012-06-19 (×4): qty 5

## 2012-06-19 MED ORDER — MUPIROCIN 2 % EX OINT
1.0000 "application " | TOPICAL_OINTMENT | Freq: Two times a day (BID) | CUTANEOUS | Status: AC
  Administered 2012-06-19 – 2012-06-23 (×10): 1 via NASAL
  Filled 2012-06-19 (×2): qty 22

## 2012-06-19 MED ORDER — CHLORHEXIDINE GLUCONATE CLOTH 2 % EX PADS: 6.0000 | MEDICATED_PAD | Freq: Every day | CUTANEOUS | Status: DC

## 2012-06-19 MED ORDER — CLINDAMYCIN PHOSPHATE 600 MG/50ML IV SOLN
600.0000 mg | Freq: Four times a day (QID) | INTRAVENOUS | Status: AC
  Administered 2012-06-19 – 2012-06-20 (×4): 600 mg via INTRAVENOUS
  Filled 2012-06-19 (×5): qty 50

## 2012-06-19 MED ORDER — CHLORHEXIDINE GLUCONATE 0.12 % MT SOLN
15.0000 mL | Freq: Two times a day (BID) | OROMUCOSAL | Status: DC
  Administered 2012-06-19 – 2012-06-25 (×12): 15 mL via OROMUCOSAL
  Filled 2012-06-19 (×12): qty 15

## 2012-06-19 MED ORDER — PIVOT 1.5 CAL PO LIQD
1000.0000 mL | ORAL | Status: DC
  Administered 2012-06-19 – 2012-06-20 (×2): 1000 mL
  Administered 2012-06-21: 1 mL
  Filled 2012-06-19 (×4): qty 1000

## 2012-06-19 MED ORDER — VITAMIN E 15 UNIT/0.3ML PO SOLN
400.0000 [IU] | Freq: Three times a day (TID) | ORAL | Status: DC
  Administered 2012-06-19 – 2012-06-25 (×12): 400 [IU]
  Filled 2012-06-19 (×22): qty 8

## 2012-06-19 MED ORDER — IOHEXOL 350 MG/ML SOLN
100.0000 mL | Freq: Once | INTRAVENOUS | Status: AC | PRN
  Administered 2012-06-19: 100 mL via INTRAVENOUS

## 2012-06-19 MED ORDER — PIVOT 1.5 CAL PO LIQD
1000.0000 mL | ORAL | Status: DC
  Filled 2012-06-19 (×2): qty 1000

## 2012-06-19 NOTE — Progress Notes (Signed)
Patient ID: Christopher Henderson, male   DOB: [birth year], 67 y.o.   MRN: [id number]   LOS: 1 day   Subjective: Sedated, on vent.   Objective: Vital signs in last 24 hours: Temp:  [96.9 F (36.1 C)-98.8 F (37.1 C)] 96.9 F (36.1 C) (03/25 0813) Pulse Rate:  [70-103] 81 (03/25 0700) Resp:  [13-23] 13 (03/25 0700) BP: (75-136)/(42-78) 92/56 mmHg (03/25 0600) SpO2:  [80 %-100 %] 97 % (03/25 0700) FiO2 (%):  [60 %-100 %] 60 % (03/25 0405) Weight:  [235 lb (106.595 kg)] 235 lb (106.595 kg) (03/24 2231)    Vent: PC/60%/8PEEP/RR15  UOP: 23ml/h Net: +96ml/admission   CT No air leak 112ml/insertion   Laboratory  CBC  Recent Labs  06/18/12 1622 06/19/12 0618  WBC 17.6* 8.4  HGB 15.8 12.8*  HCT 44.3 37.8*  PLT 240 145*   BMET  Recent Labs  06/18/12 1622 06/19/12 0618  NA 138 141  K 3.7 4.8  CL 100 107  CO2 24 23  GLUCOSE 204* 135*  BUN 13 15  CREATININE 1.18 1.08  CALCIUM 9.8 8.3*    Radiology Results PORTABLE CHEST - 1 VIEW  Comparison: Yesterday  Findings: Endotracheal tube, NG tube, left chest tube are stable.  Low volumes with bibasilar atelectasis. Multiple left rib  fractures. No pneumothorax.  IMPRESSION:  Stable. No pneumothorax. Bibasilar atelectasis  Original Report Authenticated By: Jolaine Click, M.D.   Physical Exam General appearance: no distress Resp: clear to auscultation bilaterally and SQE left Cardio: regular rate and rhythm GI: normal findings: bowel sounds normal and soft Extremities: Warm   Assessment/Plan: Fall Multiple left rib fxs w/HTPX s/p CT -- Will get thoracic consult Open left 5th proximal phalanx/3rd MC fxs s/p ORIF -- NWB Grade 2 splenic laceration ARF -- Unsure why on PC, will try to move to conventional ventilation. Continue ventilator while awaiting thoracic consult ABL anemia -- Mild, follow HTN -- Home meds once extubated FEN -- Begin TF VTE -- SCD's (No Lovenox with splenic lac) Dispo -- VDRF  Critical care  time: 0820 -- 0855   Freeman Caldron, PA-C Pager: [phone] General Trauma PA Pager: [phone]   06/19/2012

## 2012-06-19 NOTE — Progress Notes (Signed)
UR completed 

## 2012-06-19 NOTE — Op Note (Signed)
NAMEKRISTOPH, SATTLER               ACCOUNT NO.:  [id number]  MEDICAL RECORD NO.:  [id number]  LOCATION:  2310                         FACILITY:  [facility]  PHYSICIAN:  Artist Pais. Nakaila Freeze, M.D.DATE OF BIRTH:  Jan 22, 1946  DATE OF PROCEDURE:  06/18/2012 DATE OF DISCHARGE:                              OPERATIVE REPORT   PREOPERATIVE DIAGNOSIS:  Complex laceration, left hand with open fractures, proximal phalanx and small finger and long metacarpal.  POSTOPERATIVE DIAGNOSIS:  Complex laceration, left hand with open fractures, proximal phalanx and small finger and long metacarpal.  PROCEDURES:  Incision and drainage above with open reduction and internal fixation of proximal phalangeal fracture, left small finger, and open reduction internal fixation, left long finger and metacarpal fracture.  SURGEON:  Artist Pais. Mina Marble, M.D.  ASSISTANT:  None.  ANESTHESIA:  General.  No complication.  No drains.  DESCRIPTION OF PROCEDURE:  The patient was taken to the operating suite. After induction of adequate general anesthesia, left upper extremity was prepped and draped in sterile fashion.  2 L normal saline was used to irrigate the complex wound across the left hand, which went from the radial border of index finger across the A1 pulley area all the way to the small finger.  Inspection revealed intact neurovascular structures and tendinous units but significant particularly wooden matter from his fall out of a tree.  This was carefully removed using pickups and smoothed with hemostat after the 2 L was done.  This was meticulously closed with a 4-0 Vicryl Rapide to close this complex wound.  At this point in time, x-rays of the bone to the field, the proximal phalangeal fracture was fixed with a 0.45 K-wires x2 and the long metacarpal fracture with 0.45 K-wires x2 as well.  K-wires were cut as the skin bent upon themselves, and then a hand was dressed with Xeroform, 4x4s, compression  wrap, and a volar splint.  The patient tolerated the procedure well in a concealed fashion.    Artist Pais Mina Marble, M.D.    MAW/MEDQ  D:  06/18/2012  T:  06/19/2012  Job:  [id number]

## 2012-06-19 NOTE — Progress Notes (Signed)
Pt placed on full support per order. Pt unable to tolerate Vt of 8cc's per kg which is . Pt placed on 550, RR 15, 40%, +8. Pt continues to breath stack, and bite ET tube. RT will continue to monitor.

## 2012-06-19 NOTE — Progress Notes (Signed)
Patient transported to and from CT with RN and transporter.  No complications noted during transport.  Vent hooked back up to oxygen and air and plugged into Red out.  Ambu Bag is at head of bed.  RT will continue to monitor.

## 2012-06-19 NOTE — Progress Notes (Signed)
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 Median arcuate ligament compression.  CTA cannot be used to make this diagnosis as it requires dynamic verification of compression with inhalation and exhalation maneuvers under fluoroscopy.    The patient does NOT normally have abdominal symptoms so he does NOT have median arcuate ligament syndrome.  This dynamic compression is present in ALL patients to some degree. No further intervention is needed.  The patient can follow up with me in the office if needed.  Subjective  - 1 Day Post-Op  Intubated and sedate  Objective Filed Vitals:   06/19/12 0700 06/19/12 0800 06/19/12 0813 06/19/12 0833  BP:  86/55  82/53  Pulse: 81   75  Temp:   96.9 F (36.1 C)   TempSrc:   Axillary   Resp: 13   15  Height:      Weight:      SpO2: 97%   98%    Intake/Output Summary (Last 24 hours) at 06/19/12 0948 Last data filed at 06/19/12 0800  Gross per 24 hour  Intake 2674.05 ml  Output   1660 ml  Net 1014.05 ml    PULM  BLL rales, L chest tube CV  RRR GI  soft, NTND, OGT  Laboratory CBC    Component Value Date/Time   WBC 8.4 06/19/2012 0618   HGB 12.8* 06/19/2012 0618   HCT 37.8* 06/19/2012 0618   PLT 145* 06/19/2012 0618    BMET    Component Value Date/Time   NA 141 06/19/2012 0618   K 4.8 06/19/2012 0618   CL 107 06/19/2012 0618   CO2 23 06/19/2012 0618   GLUCOSE 135* 06/19/2012 0618   BUN 15 06/19/2012 0618   CREATININE 1.08 06/19/2012 0618   CALCIUM 8.3* 06/19/2012 0618   GFRNONAA 70* 06/19/2012 0618   GFRAA 81* 06/19/2012 0618   RADIOLOGY Ct Chest W Contrast  06/18/2012  *RADIOLOGY REPORT*  Clinical Data:  Larey Seat.  Chest and abdominal trauma.  Pneumothorax.  CT CHEST, ABDOMEN AND PELVIS WITH CONTRAST  Technique:  Multidetector CT imaging of the  chest, abdomen and pelvis was performed following the standard protocol during bolus administration of intravenous contrast.  Contrast: 80mL OMNIPAQUE IOHEXOL 300 MG/ML  SOLN  Comparison:  Chest x-ray 06/18/2012.  CT CHEST  Findings:  Examination of the chest wall demonstrates extensive air throughout the deep soft tissues and in the subcutaneous fat mainly along the left chest.  There is also a pneumomediastinum, pneumothorax and air tracking up of the neck.  No chest wall hematoma.  No supraclavicular or axillary mass or adenopathy.  The sternum is intact and the thoracic vertebral bodies are normally aligned.  No definite fractures.  There are numerous left-sided rib fractures with several ribs are fractured and more than one placed consistent with a flail chest. The 3rd through 12th ribs are fractured the left side.  There is also a comminuted fracture of the left scapula with displaced fragments.  The clavicles are intact and both shoulders are intact.  The heart is normal in size.  No pericardial effusion or hematoma. The aorta is normal in caliber.  No dissection.  No mediastinal hematoma.  The esophagus grossly normal.  Coronary artery calcifications are noted.  There is fairly extensive pneumomediastinum.  Examination of the lung parenchyma demonstrates a left-sided chest  tube in place.  There is a small pneumothorax.  Extensive atelectasis and possible left lower lobe pulmonary contusion.  A small left pleural effusion or pleural hematoma is noted.  IMPRESSION:  1.  Numerous left-sided rib fractures involving the 3rd through 12th ribs with several rib fractures in two places (flail chest). 2.  Pneumothorax, pneumomediastinum and extensive subcutaneous emphysema. 3.  The heart and great vessels are intact.  No mediastinal hematoma. 4.  A small left pleural effusion/pleural hematoma with areas of bilateral atelectasis. 5.  Comminuted left scapular fracture.  CT ABDOMEN AND PELVIS  Findings:  The solid  abdominal organs are intact. On the initial phase imaging cannot exclude a small contusion involving the posterior and inferior aspect of the spleen.  The gallbladder is normal.  No common bile duct dilatation.  The stomach, duodenum, small bowel and colon are grossly normal without oral contrast.  No mesenteric or retroperitoneal mass, adenopathy or hematoma.  The aorta is normal in caliber.  Minimal scattered atherosclerotic calcifications.  The origin of the celiac access is abnormal. There is central low attenuation which could reflect clot and there is strandy interstitial change around the vessel.  Findings suspicious for acute short segment dissection.  Recommend dedicated CTA when able.  No lumbar vertebral body fractures or transverse process fractures. The facets are normally aligned.  The bony pelvis is intact.  Mild SI joint degenerative changes.  No pelvic mass or hematoma.  No free pelvic fluid collections.  The bladder, prostate gland and seminal vesicles are unremarkable.  There are bilateral inguinal hernias with a high riding right testicle in the lower inguinal canal.  The left inguinal hernia only contains fat.  IMPRESSION:  1.  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  A dedicated CT angiogram of the abdomen is recommended. 2.  Possible subtle splenic contusions.  Recommend close clinical observation. 3.  Bilateral inguinal hernias.   Original Report Authenticated By: Rudie Meyer, M.D.    Ct Cervical Spine Wo Contrast  06/18/2012  *RADIOLOGY REPORT*  Clinical Data: Larey Seat.  Injured chest.  CT CERVICAL SPINE WITHOUT CONTRAST  Technique:  Multidetector CT imaging of the cervical spine was performed. Multiplanar CT image reconstructions were also generated.  Comparison: None  Findings: The sagittal reformatted images demonstrate normal alignment of the cervical vertebral bodies.  Disc spaces and vertebral bodies are maintained.  No acute bony findings or  abnormal prevertebral soft tissue swelling.  The facets are normally aligned.  No facet or laminar fractures are seen. No large disc protrusions.  The neural foramen are patent.  The skull base C1 and C1-C2 articulations are maintained.  The dens is normal.  There are scattered cervical lymph nodes.  The lung apices are clear.  Extensive subcutaneous emphysema noted, left greater than right along with air tracking up along the esophagus and into the space of the neck related to the chest trauma and pneumomediastinum.  IMPRESSION:  1.  Normal alignment and no acute bony findings. 2.  Extensive air throughout the soft tissues.   Original Report Authenticated By: Rudie Meyer, M.D.    Ct Angio Abdomen W/cm &/or Wo Contrast  06/19/2012  *RADIOLOGY REPORT*  Clinical Data:  Celiac artery dissection.  Trauma.  CT ANGIOGRAPHY CHEST AND ABDOMEN  Technique:  Multidetector CT imaging of the chest and abdomen was performed using the standard protocol during bolus administration of intravenous contrast.  Multiplanar CT image reconstructions including MIPs were obtained to evaluate the  vascular anatomy.  Contrast: OMNIPAQUE IOHEXOL 350 MG/ML SOLN  Comparison:  06/18/2012 at 1650 hours.  CTA CHEST  Findings:  Scanned in the chest begins at the level of the aortic arch.  Ascending aorta appears normal.  No pericardial effusion. Left thoracostomy tube is present.  The descending thoracic aorta appears intact.  There is no thoracic aortic dissection.  Patulous of thoracic esophagus with nasogastric tube.  Multiple left rib fractures with left thoracostomy tube.  Marked bilateral lower lobe collapse / consolidation which has progressed compared to the prior exam.  Some of this represents atelectasis.  Superimposed aspiration may be present.  The left chest soft tissues demonstrate emphysema.  Pneumomediastinum is noted.  Tiny anterior left pneumothorax adjacent to the thoracostomy tube. Partially visualized left scapular  fracture.   Review of the MIP images confirms the above findings.  IMPRESSION: No thoracic aortic dissection or acute aortic abnormality. Progressive dependent collapse / consolidation of the lower lobes.  CTA ABDOMEN  Findings:  The abdominal aorta appears normal.  Both common iliac arteries and visualized external iliac arteries extending into the pelvis appear normal.  There is a good distal opacification of the celiac axis which may be antegrade and retrograde and combination.  There is a tiny amount of stranding around the proximal celiac axis.  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  Hepatic artery and splenic artery appear normal.  No aneurysm or pseudoaneurysm. Vicarious excretion of contrast is present in the gallbladder.  The superior mesenteric artery also appears within normal limits. Distal filling appears normal.  The inferior mesenteric artery is patent.  There is no evidence of bowel ischemia.  Emphysema is present along the left flank, tracking inferiorly from the chest.  Low attenuation areas of the spleen appears similar to the prior exam, with a tiny amount of perisplenic fluid.  Findings compatible with splenic contusions with probable small laceration.  There is no active extravasation.  No pseudo aneurysm.  Kidneys appear within normal limits.  There is contrast in both ureters. No evidence of bowel ischemia.   Review of the MIP images confirms the above findings.  IMPRESSION: 1.  Negative for celiac artery dissection. 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  Visceral vasculature appears within normal limits. 2.  Scattered areas of low attenuation in the spleen are compatible with contusion.  Tiny amount of perisplenic fluid likely represents a  small capsular laceration.   Original Report Authenticated By: Andreas Newport, M.D.    Ct Abdomen Pelvis W Contrast  06/18/2012  *RADIOLOGY REPORT*  Clinical Data:  Larey Seat.  Chest and abdominal trauma.  Pneumothorax.  CT CHEST, ABDOMEN AND PELVIS WITH CONTRAST  Technique:  Multidetector CT imaging of the chest, abdomen and pelvis was performed following the standard protocol during bolus administration of intravenous contrast.  Contrast: 80mL OMNIPAQUE IOHEXOL 300 MG/ML  SOLN  Comparison:  Chest x-ray 06/18/2012.  CT CHEST  Findings:  Examination of the chest wall demonstrates extensive air throughout the deep soft tissues and in the subcutaneous fat mainly along the left chest.  There is also a pneumomediastinum, pneumothorax and air tracking up of the neck.  No chest wall hematoma.  No supraclavicular or axillary mass or adenopathy.  The sternum is intact and the thoracic vertebral bodies are normally aligned.  No definite fractures.  There are numerous left-sided rib fractures with several ribs are fractured and more than one placed consistent with a flail chest. The 3rd through 12th ribs are fractured the left side.  There is also a comminuted fracture of the left scapula with displaced fragments.  The clavicles are intact and both shoulders are intact.  The heart is normal in size.  No pericardial effusion or hematoma. The aorta is normal in caliber.  No dissection.  No mediastinal hematoma.  The esophagus grossly normal.  Coronary artery calcifications are noted.  There is fairly extensive pneumomediastinum.  Examination of the lung parenchyma demonstrates a left-sided chest tube in place.  There is a small pneumothorax.  Extensive atelectasis and possible left lower lobe pulmonary contusion.  A small left pleural effusion or pleural hematoma is noted.  IMPRESSION:  1.  Numerous left-sided rib fractures involving the 3rd through 12th ribs with several rib fractures in two places (flail chest). 2.   Pneumothorax, pneumomediastinum and extensive subcutaneous emphysema. 3.  The heart and great vessels are intact.  No mediastinal hematoma. 4.  A small left pleural effusion/pleural hematoma with areas of bilateral atelectasis. 5.  Comminuted left scapular fracture.  CT ABDOMEN AND PELVIS  Findings:  The solid abdominal organs are intact. On the initial phase imaging cannot exclude a small contusion involving the posterior and inferior aspect of the spleen.  The gallbladder is normal.  No common bile duct dilatation.  The stomach, duodenum, small bowel and colon are grossly normal without oral contrast.  No mesenteric or retroperitoneal mass, adenopathy or hematoma.  The aorta is normal in caliber.  Minimal scattered atherosclerotic calcifications.  The origin of the celiac access is abnormal. There is central low attenuation which could reflect clot and there is strandy interstitial change around the vessel.  Findings suspicious for acute short segment dissection.  Recommend dedicated CTA when able.  No lumbar vertebral body fractures or transverse process fractures. The facets are normally aligned.  The bony pelvis is intact.  Mild SI joint degenerative changes.  No pelvic mass or hematoma.  No free pelvic fluid collections.  The bladder, prostate gland and seminal vesicles are unremarkable.  There are bilateral inguinal hernias with a high riding right testicle in the lower inguinal canal.  The left inguinal hernia only contains fat.  IMPRESSION:  1.  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  A dedicated CT angiogram of the abdomen is recommended. 2.  Possible subtle splenic contusions.  Recommend close clinical observation. 3.  Bilateral inguinal hernias.   Original Report Authenticated By: Rudie Meyer, M.D.    Dg Pelvis Portable  06/18/2012  *RADIOLOGY REPORT*  Clinical Data: Trauma  PORTABLE PELVIS  Comparison: None.  Findings: No acute fracture and no dislocation.   IMPRESSION: No acute bony pathology.   Original Report Authenticated By: Jolaine Click, M.D.    Dg Chest Port 1 View  06/19/2012  *RADIOLOGY REPORT*  Clinical Data: Trauma.  Rib fractures.  PORTABLE CHEST - 1 VIEW  Comparison: Yesterday  Findings: Endotracheal tube, NG tube, left chest tube are stable. Low volumes with bibasilar atelectasis.  Multiple left rib fractures.  No pneumothorax.  IMPRESSION: Stable.  No pneumothorax.  Bibasilar atelectasis   Original Report Authenticated By: Jolaine Click, M.D.    Dg Chest Port 1 View  06/18/2012  *RADIOLOGY REPORT*  Clinical Data: Low oxygen stats during surgery.  Status post intubation.  PORTABLE CHEST - 1 VIEW  Comparison: 06/18/2012.  Findings: Endotracheal tube tip is 33 mm from the carina. Nasogastric tube is in the stomach with the tip not visualized. Left thoracostomy tube is present with left chest wall emphysema. The tip of the thoracostomy tube is adjacent to the right hilum. Lung volumes are markedly low, with bilateral basilar and right midlung perihilar subsegmental atelectasis.  The retrocardiac density is present, also likely representing atelectasis.  There is no definite left pneumothorax following thoracostomy tube placement.  Left-sided rib fractures are again noted.  IMPRESSION:  1.  Support apparatus appears in good position. 2.  No left-sided pneumothorax following left thoracostomy tube placement. 3.  Markedly low lung volumes with basilar and perihilar atelectasis.   Original Report Authenticated By: Andreas Newport, M.D.    Dg Chest Portable 1 View  06/18/2012  *RADIOLOGY REPORT*  Clinical Data: Chest tube placement, pneumothorax, rib fractures, fell from tree, flail chest  PORTABLE CHEST - 1 VIEW  Comparison: Portable exam 1615 hours compared to 06/18/2012 at 1558 hours  Findings: New left basilar thoracostomy tube. Decreased left pneumothorax. Low lung volumes with scattered infiltrate within left lung. Mild right basilar atelectasis.  Multiple left rib fractures again seen. Left chest wall emphysema extending into axilla.  IMPRESSION: Decrease in left pneumothorax following left thoracostomy tube placement. Mild scattered infiltrate in left lung with minimal right basilar atelectasis. Multiple left rib fractures.   Original Report Authenticated By: Ulyses Southward, M.D.    Dg Chest Portable 1 View  06/18/2012  *RADIOLOGY REPORT*  Clinical Data: Fall from tree with flail chest.  PORTABLE CHEST - 1 VIEW  Comparison: None.  Findings: Single supine view of the chest was obtained.  There is lucency along the left lung base and findings are consistent with a deep sulcus sign and pneumothorax.  There is a large amount of subcutaneous gas.  There are multiple displaced left rib fractures. The entire right side of the chest was not imaged.  Fractures involving the left fourth, fifth, sixth and seventh ribs.  The left fourth and fifth rib fractures may be segmental fractures.  IMPRESSION: Multiple left rib fractures with a pneumothorax.  Large amount of subcutaneous gas.   Original Report Authenticated By: Richarda Overlie, M.D.    Dg Hand Complete Left  06/18/2012  *RADIOLOGY REPORT*  Clinical Data: Injury  LEFT HAND - COMPLETE 3+ VIEW  Comparison: None.  Findings: There is an acute fracture involving the proximal metaphysis of the proximal phalanx of the small finger.  Slight anterior displacement of the distal fragment.  Slight dorsal angulation.  There is also a spiral fracture involving the distal metaphysis of the third metacarpal with some displacement.  Soft tissue injury of the small finger may be present.  IMPRESSION: Acute fractures involving the proximal phalanx of the small finger and distal third metacarpal.   Original Report Authenticated By: Jolaine Click, M.D.      Leonides Sake, MD [facility] Office: [phone] Pager: [phone]  06/19/2012, 9:48 AM

## 2012-06-19 NOTE — Progress Notes (Signed)
INITIAL NUTRITION ASSESSMENT  DOCUMENTATION CODES Per approved criteria  -Obesity Unspecified   INTERVENTION:  Pivot 1.5 formula at 25 ml/hr  Once Propofol discontinued, add Prostat liquid protein 60 ml 4 times daily to provide 1700 total kcals (~ 22 kcals/kg ideal body weight), 176 gm protein (100% of estimated protein needs), 455 ml of free water  Liquid MVI daily via tube RD to follow for nutrition care plan  NUTRITION DIAGNOSIS: Inadequate oral intake related to inability to eat as evidenced by NPO status  Goal: EN to provide 60-70% of estimated calorie needs (22-25 kcals/kg ideal body weight) and 100% of estimated protein needs, based on ASPEN guidelines for permissive underfeeding in critically ill obese individuals  Monitor:  EN regimen & tolerance, respiratory status, weight, labs, I/O's  Reason for Assessment: Consult ---> TF initiation & management   67 y.o. male  Admitting Dx: fall from tree  ASSESSMENT: Patient presented as a Level 1 trauma who was cutting a tree limb, lost balance and fell approximately 10 feet; was bought in by EMS in moderate respiratory distress and tachycardic; small flail segment noted over left anterior chest; complex open injury to left hand.  Patient s/p procedure 3/24: I & D, ORIF PHALANGEAL FRACTURE, LEFT SMALL & LONG FINGERS, METACARPAL FRACTURE  Patient is currently intubated on ventilator support ---> OGT in place MV: 7.2 Temp: 37.1 Propofol: 25.6 ml/hr ---> 676 fat kcals   Height: Ht Readings from Last 1 Encounters:  06/18/12 6' (1.829 m)    Weight: Wt Readings from Last 1 Encounters:  06/18/12 235 lb (106.595 kg)    Ideal Body Weight: 178 lb  % Ideal Body Weight: 132%  Wt Readings from Last 10 Encounters:  06/18/12 235 lb (106.595 kg)  06/18/12 235 lb (106.595 kg)    Usual Body Weight: unable to obtain  % Usual Body Weight: ---  BMI:  Body mass index is 31.86 kg/(m^2).  Estimated Nutritional Needs: Kcal:  1872 Protein: 160-178 gm Fluid: 1.8-1.9 L  Skin: hand surgical incision, leg abrasion   Diet Order: NPO  EDUCATION NEEDS: -No education needs identified at this time   Intake/Output Summary (Last 24 hours) at 06/19/12 1110 Last data filed at 06/19/12 1000  Gross per 24 hour  Intake 2974.85 ml  Output   1750 ml  Net 1224.85 ml    Labs:   Recent Labs Lab 06/18/12 1607 06/18/12 1622 06/19/12 0618  NA 139 138 141  K 3.5 3.7 4.8  CL 103 100 107  CO2  --  24 23  BUN 13 13 15   CREATININE 1.40* 1.18 1.08  CALCIUM  --  9.8 8.3*  GLUCOSE 207* 204* 135*    Scheduled Meds: . Chlorhexidine Gluconate Cloth  6 each Topical q morning - 10a  . clindamycin (CLEOCIN) IV  600 mg Intravenous Q6H  . mupirocin ointment  1 application Nasal BID  . pantoprazole  40 mg Oral Daily   Or  . pantoprazole (PROTONIX) IV  40 mg Intravenous Daily  . selenium  200 mcg Per Tube Daily  . vitamin C  1,000 mg Per Tube Q8H  . vitamin e  400 Units Per Tube Q8H    Continuous Infusions: . 0.9 % NaCl with KCl 20 mEq / L 100 mL/hr at 06/19/12 0859  . feeding supplement (PIVOT 1.5 CAL)    . fentaNYL infusion INTRAVENOUS 50 mcg/hr (06/19/12 1028)  . propofol 40 mcg/kg/min (06/19/12 1027)    History reviewed. No pertinent past medical history.  Past Surgical History  Procedure Laterality Date  . Hand / finger tendon lesion excision Left 2000  . Knee surgery Left 1972    Maureen Chatters, RD, LDN Pager #: [phone] After-Hours Pager #: [phone]

## 2012-06-19 NOTE — Consult Note (Signed)
[address]       [place] [address]             [phone]        Reason for Consult: Multiple rib fractures Referring Physician:  Dr. Jimmye Norman  Christopher Henderson is an 67 y.o. male.  HPI:   Presented as a level 1 trauma yesterday after cutting a tree limb, lost balance and fell approximately 10 feet.  The patient denied loss of consciousness.  The patient was complaining of left sided chest pain, shortness of breath, and left hand pain. He sustained a laceration to his left hand and an abrasion to his right lower extremity.  He was bought in by EMS and was visibly in moderate respiratory distress and tachycardic.  CT scan the chest showed fractures of the left third through 12th ribs with a couple ribs fractured in 2 places. The majority of the rib fractures were located posteriorly within a few centimeters of the transverse processes. There was left hemopneumothorax with pneumomediastinum and extensive subcutaneous emphysema. The heart and great vessels were intact. There is no mediastinal hematoma. There was left pulmonary contusion atelectasis. There was a comminuted left scapular fracture. There was also a question of a dissection at the origin of the celiac artery but a dedicated CT angiogram showed no evidence of dissection here. He had a chest tube placed by trauma surgery. The patient was taken the operating room by orthopedic surgery and underwent incision and drainage and fixation of open fractures of the left hand. He has remained on the ventilator since.    History reviewed. No pertinent past medical history.  Past Surgical History  Procedure Laterality Date  . Hand / finger tendon lesion excision Left 2000  . Knee surgery Left 1972    No family history on file.  Social History:  has no tobacco, alcohol, and drug history on file.  Allergies:  Allergies  Allergen Reactions  . Penicillins Hives    Medications:  I have reviewed the patient's  current medications. Prior to Admission:  Prescriptions prior to admission  Medication Sig Dispense Refill  . amLODipine (NORVASC) 10 MG tablet Take 10 mg by mouth daily.      . benazepril (LOTENSIN) 40 MG tablet Take 40 mg by mouth daily.       Scheduled: . Chlorhexidine Gluconate Cloth  6 each Topical q morning - 10a  . clindamycin (CLEOCIN) IV  600 mg Intravenous Q6H  . mupirocin ointment  1 application Nasal BID  . pantoprazole  40 mg Oral Daily   Or  . pantoprazole (PROTONIX) IV  40 mg Intravenous Daily  . selenium  200 mcg Per Tube Daily  . vitamin C  1,000 mg Per Tube Q8H  . vitamin e  400 Units Per Tube Q8H   Continuous: . 0.9 % NaCl with KCl 20 mEq / L 100 mL/hr at 06/19/12 0859  . feeding supplement (PIVOT 1.5 CAL)    . fentaNYL infusion INTRAVENOUS 50 mcg/hr (06/19/12 1028)  . propofol 40 mcg/kg/min (06/19/12 1027)   WNU:[id number], diphenhydrAMINE, fentaNYL, naloxone, ondansetron (ZOFRAN) IV, sodium chloride Anti-infectives   Start     Dose/Rate Route Frequency Ordered Stop   06/19/12 1200  clindamycin (CLEOCIN) IVPB 600 mg     600 mg 100 mL/hr over 30 Minutes Intravenous 4 times per day 06/19/12 0854 06/20/12 1159   06/19/12 0600  vancomycin (VANCOCIN) IVPB 1000 mg/200 mL premix     1,000 mg 200 mL/hr  over 60 Minutes Intravenous On call to O.R. 06/18/12 1713 06/19/12 0730   06/18/12 1715  vancomycin (VANCOCIN) IVPB 1000 mg/200 mL premix     1,000 mg 200 mL/hr over 60 Minutes Intravenous  Once 06/18/12 1714 06/18/12 1820      Results for orders placed during the hospital encounter of 06/18/12 (from the past 48 hour(s))  TYPE AND SCREEN     Status: None   Collection Time    06/18/12  3:50 PM      Result Value Range   ABO/RH(D) A POS     Antibody Screen NEG     Sample Expiration 06/21/2012     Unit Number [id number]     Blood Component Type RED CELLS,LR     Unit division 00     Status of Unit REL FROM [facility]     Unit tag comment VERBAL ORDERS PER DR  LOCKWOOD     Transfusion Status OK TO TRANSFUSE     Crossmatch Result NOT NEEDED     Unit Number [id number]     Blood Component Type RED CELLS,LR     Unit division 00     Status of Unit REL FROM [facility]     Unit tag comment VERBAL ORDERS PER DR LOCKWOOD     Transfusion Status OK TO TRANSFUSE     Crossmatch Result NOT NEEDED    ABO/RH     Status: None   Collection Time    06/18/12  3:50 PM      Result Value Range   ABO/RH(D) A POS    POCT I-STAT, CHEM 8     Status: Abnormal   Collection Time    06/18/12  4:07 PM      Result Value Range   Sodium 139  135 - 145 mEq/L   Potassium 3.5  3.5 - 5.1 mEq/L   Chloride 103  96 - 112 mEq/L   BUN 13  6 - 23 mg/dL   Creatinine, Ser 2.53 (*) 0.50 - 1.35 mg/dL   Glucose, Bld 664 (*) 70 - 99 mg/dL   Calcium, Ion 4.03  4.74 - 1.30 mmol/L   TCO2 24  0 - 100 mmol/L   Hemoglobin 16.0  13.0 - 17.0 g/dL   HCT 25.9  56.3 - 87.5 %  CDS SEROLOGY     Status: None   Collection Time    06/18/12  4:22 PM      Result Value Range   CDS serology specimen       Value: SPECIMEN WILL BE HELD FOR 14 DAYS IF TESTING IS REQUIRED  COMPREHENSIVE METABOLIC PANEL     Status: Abnormal   Collection Time    06/18/12  4:22 PM      Result Value Range   Sodium 138  135 - 145 mEq/L   Potassium 3.7  3.5 - 5.1 mEq/L   Chloride 100  96 - 112 mEq/L   CO2 24  19 - 32 mEq/L   Glucose, Bld 204 (*) 70 - 99 mg/dL   BUN 13  6 - 23 mg/dL   Creatinine, Ser 6.43  0.50 - 1.35 mg/dL   Calcium 9.8  8.4 - 32.9 mg/dL   Total Protein 7.3  6.0 - 8.3 g/dL   Albumin 4.2  3.5 - 5.2 g/dL   AST 51 (*) 0 - 37 U/L   ALT 42  0 - 53 U/L   Alkaline Phosphatase 62  39 - 117 U/L   Total Bilirubin 0.5  0.3 - 1.2 mg/dL   GFR calc non Af Amer 63 (*) >90 mL/min   GFR calc Af Amer 73 (*) >90 mL/min   Comment:            The eGFR has been calculated     using the CKD EPI equation.     This calculation has not been     validated in all clinical     situations.     eGFR's persistently     <90  mL/min signify     possible Chronic Kidney Disease.  CBC     Status: Abnormal   Collection Time    06/18/12  4:22 PM      Result Value Range   WBC 17.6 (*) 4.0 - 10.5 K/uL   RBC 5.01  4.22 - 5.81 MIL/uL   Hemoglobin 15.8  13.0 - 17.0 g/dL   HCT 16.1  09.6 - 04.5 %   MCV 88.4  78.0 - 100.0 fL   MCH 31.5  26.0 - 34.0 pg   MCHC 35.7  30.0 - 36.0 g/dL   RDW 40.9  81.1 - 91.4 %   Platelets 240  150 - 400 K/uL  PROTIME-INR     Status: None   Collection Time    06/18/12  4:22 PM      Result Value Range   Prothrombin Time 13.0  11.6 - 15.2 seconds   INR 0.99  0.00 - 1.49  BLOOD GAS, ARTERIAL     Status: Abnormal   Collection Time    06/18/12  9:25 PM      Result Value Range   FIO2 0.80     Delivery systems VENTILATOR     Mode PRESSURE CONTROL     Rate 15     Peep/cpap 8.0     Pressure control 15     pH, Arterial 7.311 (*) 7.350 - 7.450   pCO2 arterial 45.4 (*) 35.0 - 45.0 mmHg   pO2, Arterial 73.3 (*) 80.0 - 100.0 mmHg   Bicarbonate 22.3  20.0 - 24.0 mEq/L   TCO2 23.7  0 - 100 mmol/L   Acid-base deficit 3.0 (*) 0.0 - 2.0 mmol/L   O2 Saturation 92.3     Patient temperature 98.6     Collection site RIGHT RADIAL     Drawn by [phone]     Sample type ARTERIAL DRAW     Allens test (pass/fail) PASS  PASS  MRSA PCR SCREENING     Status: Abnormal   Collection Time    06/18/12 10:38 PM      Result Value Range   MRSA by PCR POSITIVE (*) NEGATIVE   Comment:            The GeneXpert MRSA Assay (FDA     approved for NASAL specimens     only), is one component of a     comprehensive MRSA colonization     surveillance program. It is not     intended to diagnose MRSA     infection nor to guide or     monitor treatment for     MRSA infections.     RESULT CALLED TO, READ BACK BY AND VERIFIED WITH:     CALLED TO RN JENNIFER PARRISH [id number] @0001  THANEY  BLOOD GAS, ARTERIAL     Status: Abnormal   Collection Time    06/19/12  4:40 AM      Result Value Range   FIO2 0.60  Delivery  systems VENTILATOR     Mode PRESSURE CONTROL     Rate 15     Peep/cpap 8.0     PIP 15.0     pH, Arterial 7.359  7.350 - 7.450   pCO2 arterial 40.0  35.0 - 45.0 mmHg   pO2, Arterial 73.0 (*) 80.0 - 100.0 mmHg   Bicarbonate 22.0  20.0 - 24.0 mEq/L   TCO2 23.2  0 - 100 mmol/L   Acid-base deficit 2.6 (*) 0.0 - 2.0 mmol/L   O2 Saturation 96.2     Patient temperature 98.6     Collection site RIGHT RADIAL     Drawn by [phone]     Sample type ARTERIAL DRAW     Allens test (pass/fail) PASS  PASS  CBC     Status: Abnormal   Collection Time    06/19/12  6:18 AM      Result Value Range   WBC 8.4  4.0 - 10.5 K/uL   RBC 4.20 (*) 4.22 - 5.81 MIL/uL   Hemoglobin 12.8 (*) 13.0 - 17.0 g/dL   Comment: REPEATED TO VERIFY   HCT 37.8 (*) 39.0 - 52.0 %   MCV 90.0  78.0 - 100.0 fL   MCH 30.5  26.0 - 34.0 pg   MCHC 33.9  30.0 - 36.0 g/dL   RDW 60.4  54.0 - 98.1 %   Platelets 145 (*) 150 - 400 K/uL   Comment: REPEATED TO VERIFY  BASIC METABOLIC PANEL     Status: Abnormal   Collection Time    06/19/12  6:18 AM      Result Value Range   Sodium 141  135 - 145 mEq/L   Potassium 4.8  3.5 - 5.1 mEq/L   Comment: DELTA CHECK NOTED   Chloride 107  96 - 112 mEq/L   CO2 23  19 - 32 mEq/L   Glucose, Bld 135 (*) 70 - 99 mg/dL   BUN 15  6 - 23 mg/dL   Creatinine, Ser 1.91  0.50 - 1.35 mg/dL   Calcium 8.3 (*) 8.4 - 10.5 mg/dL   GFR calc non Af Amer 70 (*) >90 mL/min   GFR calc Af Amer 81 (*) >90 mL/min   Comment:            The eGFR has been calculated     using the CKD EPI equation.     This calculation has not been     validated in all clinical     situations.     eGFR's persistently     <90 mL/min signify     possible Chronic Kidney Disease.  URINALYSIS, MICROSCOPIC ONLY     Status: Abnormal   Collection Time    06/19/12  6:58 AM      Result Value Range   Color, Urine YELLOW  YELLOW   APPearance CLEAR  CLEAR   Specific Gravity, Urine >1.046 (*) 1.005 - 1.030   pH 5.0  5.0 - 8.0   Glucose, UA  NEGATIVE  NEGATIVE mg/dL   Hgb urine dipstick NEGATIVE  NEGATIVE   Bilirubin Urine NEGATIVE  NEGATIVE   Ketones, ur NEGATIVE  NEGATIVE mg/dL   Protein, ur NEGATIVE  NEGATIVE mg/dL   Urobilinogen, UA 1.0  0.0 - 1.0 mg/dL   Nitrite NEGATIVE  NEGATIVE   Leukocytes, UA NEGATIVE  NEGATIVE   WBC, UA 3-6  <3 WBC/hpf   RBC / HPF 0-2  <3 RBC/hpf   Bacteria, UA RARE  RARE   Squamous Epithelial / LPF FEW (*) RARE   Casts HYALINE CASTS (*) NEGATIVE   Comment: GRANULAR CAST    Ct Chest W Contrast  06/18/2012  *RADIOLOGY REPORT*  Clinical Data:  Larey Seat.  Chest and abdominal trauma.  Pneumothorax.  CT CHEST, ABDOMEN AND PELVIS WITH CONTRAST  Technique:  Multidetector CT imaging of the chest, abdomen and pelvis was performed following the standard protocol during bolus administration of intravenous contrast.  Contrast: 80mL OMNIPAQUE IOHEXOL 300 MG/ML  SOLN  Comparison:  Chest x-ray 06/18/2012.  CT CHEST  Findings:  Examination of the chest wall demonstrates extensive air throughout the deep soft tissues and in the subcutaneous fat mainly along the left chest.  There is also a pneumomediastinum, pneumothorax and air tracking up of the neck.  No chest wall hematoma.  No supraclavicular or axillary mass or adenopathy.  The sternum is intact and the thoracic vertebral bodies are normally aligned.  No definite fractures.  There are numerous left-sided rib fractures with several ribs are fractured and more than one placed consistent with a flail chest. The 3rd through 12th ribs are fractured the left side.  There is also a comminuted fracture of the left scapula with displaced fragments.  The clavicles are intact and both shoulders are intact.  The heart is normal in size.  No pericardial effusion or hematoma. The aorta is normal in caliber.  No dissection.  No mediastinal hematoma.  The esophagus grossly normal.  Coronary artery calcifications are noted.  There is fairly extensive pneumomediastinum.  Examination of the  lung parenchyma demonstrates a left-sided chest tube in place.  There is a small pneumothorax.  Extensive atelectasis and possible left lower lobe pulmonary contusion.  A small left pleural effusion or pleural hematoma is noted.  IMPRESSION:  1.  Numerous left-sided rib fractures involving the 3rd through 12th ribs with several rib fractures in two places (flail chest). 2.  Pneumothorax, pneumomediastinum and extensive subcutaneous emphysema. 3.  The heart and great vessels are intact.  No mediastinal hematoma. 4.  A small left pleural effusion/pleural hematoma with areas of bilateral atelectasis. 5.  Comminuted left scapular fracture.  CT ABDOMEN AND PELVIS  Findings:  The solid abdominal organs are intact. On the initial phase imaging cannot exclude a small contusion involving the posterior and inferior aspect of the spleen.  The gallbladder is normal.  No common bile duct dilatation.  The stomach, duodenum, small bowel and colon are grossly normal without oral contrast.  No mesenteric or retroperitoneal mass, adenopathy or hematoma.  The aorta is normal in caliber.  Minimal scattered atherosclerotic calcifications.  The origin of the celiac access is abnormal. There is central low attenuation which could reflect clot and there is strandy interstitial change around the vessel.  Findings suspicious for acute short segment dissection.  Recommend dedicated CTA when able.  No lumbar vertebral body fractures or transverse process fractures. The facets are normally aligned.  The bony pelvis is intact.  Mild SI joint degenerative changes.  No pelvic mass or hematoma.  No free pelvic fluid collections.  The bladder, prostate gland and seminal vesicles are unremarkable.  There are bilateral inguinal hernias with a high riding right testicle in the lower inguinal canal.  The left inguinal hernia only contains fat.  IMPRESSION:  1.  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  A dedicated CT  angiogram of the abdomen is recommended. 2.  Possible subtle splenic contusions.  Recommend  close clinical observation. 3.  Bilateral inguinal hernias.   Original Report Authenticated By: Rudie Meyer, M.D.    Ct Cervical Spine Wo Contrast  06/18/2012  *RADIOLOGY REPORT*  Clinical Data: Larey Seat.  Injured chest.  CT CERVICAL SPINE WITHOUT CONTRAST  Technique:  Multidetector CT imaging of the cervical spine was performed. Multiplanar CT image reconstructions were also generated.  Comparison: None  Findings: The sagittal reformatted images demonstrate normal alignment of the cervical vertebral bodies.  Disc spaces and vertebral bodies are maintained.  No acute bony findings or abnormal prevertebral soft tissue swelling.  The facets are normally aligned.  No facet or laminar fractures are seen. No large disc protrusions.  The neural foramen are patent.  The skull base C1 and C1-C2 articulations are maintained.  The dens is normal.  There are scattered cervical lymph nodes.  The lung apices are clear.  Extensive subcutaneous emphysema noted, left greater than right along with air tracking up along the esophagus and into the space of the neck related to the chest trauma and pneumomediastinum.  IMPRESSION:  1.  Normal alignment and no acute bony findings. 2.  Extensive air throughout the soft tissues.   Original Report Authenticated By: Rudie Meyer, M.D.    Ct Angio Abdomen W/cm &/or Wo Contrast  06/19/2012  *RADIOLOGY REPORT*  Clinical Data:  Celiac artery dissection.  Trauma.  CT ANGIOGRAPHY CHEST AND ABDOMEN  Technique:  Multidetector CT imaging of the chest and abdomen was performed using the standard protocol during bolus administration of intravenous contrast.  Multiplanar CT image reconstructions including MIPs were obtained to evaluate the vascular anatomy.  Contrast: OMNIPAQUE IOHEXOL 350 MG/ML SOLN  Comparison:  06/18/2012 at 1650 hours.  CTA CHEST  Findings:  Scanned in the chest begins at the level of  the aortic arch.  Ascending aorta appears normal.  No pericardial effusion. Left thoracostomy tube is present.  The descending thoracic aorta appears intact.  There is no thoracic aortic dissection.  Patulous of thoracic esophagus with nasogastric tube.  Multiple left rib fractures with left thoracostomy tube.  Marked bilateral lower lobe collapse / consolidation which has progressed compared to the prior exam.  Some of this represents atelectasis.  Superimposed aspiration may be present.  The left chest soft tissues demonstrate emphysema.  Pneumomediastinum is noted.  Tiny anterior left pneumothorax adjacent to the thoracostomy tube. Partially visualized left scapular fracture.   Review of the MIP images confirms the above findings.  IMPRESSION: No thoracic aortic dissection or acute aortic abnormality. Progressive dependent collapse / consolidation of the lower lobes.  CTA ABDOMEN  Findings:  The abdominal aorta appears normal.  Both common iliac arteries and visualized external iliac arteries extending into the pelvis appear normal.  There is a good distal opacification of the celiac axis which may be antegrade and retrograde and combination.  There is a tiny amount of stranding around the proximal celiac axis.  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  Hepatic artery and splenic artery appear normal.  No aneurysm or pseudoaneurysm. Vicarious excretion of contrast is present in the gallbladder.  The superior mesenteric artery also appears within normal limits. Distal filling appears normal.  The inferior mesenteric artery is patent.  There is no evidence of bowel ischemia.  Emphysema is present along the left flank, tracking inferiorly from the chest.  Low attenuation areas of the spleen appears similar  to the prior exam, with a tiny amount of perisplenic fluid.  Findings  compatible with splenic contusions with probable small laceration.  There is no active extravasation.  No pseudo aneurysm.  Kidneys appear within normal limits.  There is contrast in both ureters. No evidence of bowel ischemia.   Review of the MIP images confirms the above findings.  IMPRESSION: 1.  Negative for celiac artery dissection. 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  Visceral vasculature appears within normal limits. 2.  Scattered areas of low attenuation in the spleen are compatible with contusion.  Tiny amount of perisplenic fluid likely represents a small capsular laceration.   Original Report Authenticated By: Andreas Newport, M.D.    Ct Abdomen Pelvis W Contrast  06/18/2012  *RADIOLOGY REPORT*  Clinical Data:  Larey Seat.  Chest and abdominal trauma.  Pneumothorax.  CT CHEST, ABDOMEN AND PELVIS WITH CONTRAST  Technique:  Multidetector CT imaging of the chest, abdomen and pelvis was performed following the standard protocol during bolus administration of intravenous contrast.  Contrast: 80mL OMNIPAQUE IOHEXOL 300 MG/ML  SOLN  Comparison:  Chest x-ray 06/18/2012.  CT CHEST  Findings:  Examination of the chest wall demonstrates extensive air throughout the deep soft tissues and in the subcutaneous fat mainly along the left chest.  There is also a pneumomediastinum, pneumothorax and air tracking up of the neck.  No chest wall hematoma.  No supraclavicular or axillary mass or adenopathy.  The sternum is intact and the thoracic vertebral bodies are normally aligned.  No definite fractures.  There are numerous left-sided rib fractures with several ribs are fractured and more than one placed consistent with a flail chest. The 3rd through 12th ribs are fractured the left side.  There is also a comminuted fracture of the left scapula with displaced fragments.  The clavicles are intact and both  shoulders are intact.  The heart is normal in size.  No pericardial effusion or hematoma. The aorta is normal in caliber.  No dissection.  No mediastinal hematoma.  The esophagus grossly normal.  Coronary artery calcifications are noted.  There is fairly extensive pneumomediastinum.  Examination of the lung parenchyma demonstrates a left-sided chest tube in place.  There is a small pneumothorax.  Extensive atelectasis and possible left lower lobe pulmonary contusion.  A small left pleural effusion or pleural hematoma is noted.  IMPRESSION:  1.  Numerous left-sided rib fractures involving the 3rd through 12th ribs with several rib fractures in two places (flail chest). 2.  Pneumothorax, pneumomediastinum and extensive subcutaneous emphysema. 3.  The heart and great vessels are intact.  No mediastinal hematoma. 4.  A small left pleural effusion/pleural hematoma with areas of bilateral atelectasis. 5.  Comminuted left scapular fracture.  CT ABDOMEN AND PELVIS  Findings:  The solid abdominal organs are intact. On the initial phase imaging cannot exclude a small contusion involving the posterior and inferior aspect of the spleen.  The gallbladder is normal.  No common bile duct dilatation.  The stomach, duodenum, small bowel and colon are grossly normal without oral contrast.  No mesenteric or retroperitoneal mass, adenopathy or hematoma.  The aorta is normal in caliber.  Minimal scattered atherosclerotic calcifications.  The origin of the celiac access is abnormal. There is central low attenuation which could reflect clot and there is strandy interstitial change around the vessel.  Findings suspicious for acute short segment dissection.  Recommend dedicated CTA  when able.  No lumbar vertebral body fractures or transverse process fractures. The facets are normally aligned.  The bony pelvis is intact.  Mild SI joint degenerative changes.  No pelvic mass or hematoma.  No free pelvic fluid collections.  The bladder,  prostate gland and seminal vesicles are unremarkable.  There are bilateral inguinal hernias with a high riding right testicle in the lower inguinal canal.  The left inguinal hernia only contains fat.  IMPRESSION:  1.  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  A dedicated CT angiogram of the abdomen is recommended. 2.  Possible subtle splenic contusions.  Recommend close clinical observation. 3.  Bilateral inguinal hernias.   Original Report Authenticated By: Rudie Meyer, M.D.    Dg Pelvis Portable  06/18/2012  *RADIOLOGY REPORT*  Clinical Data: Trauma  PORTABLE PELVIS  Comparison: None.  Findings: No acute fracture and no dislocation.  IMPRESSION: No acute bony pathology.   Original Report Authenticated By: Jolaine Click, M.D.    Dg Chest Port 1 View  06/19/2012  *RADIOLOGY REPORT*  Clinical Data: Trauma.  Rib fractures.  PORTABLE CHEST - 1 VIEW  Comparison: Yesterday  Findings: Endotracheal tube, NG tube, left chest tube are stable. Low volumes with bibasilar atelectasis.  Multiple left rib fractures.  No pneumothorax.  IMPRESSION: Stable.  No pneumothorax.  Bibasilar atelectasis   Original Report Authenticated By: Jolaine Click, M.D.    Dg Chest Port 1 View  06/18/2012  *RADIOLOGY REPORT*  Clinical Data: Low oxygen stats during surgery.  Status post intubation.  PORTABLE CHEST - 1 VIEW  Comparison: 06/18/2012.  Findings: Endotracheal tube tip is 33 mm from the carina. Nasogastric tube is in the stomach with the tip not visualized. Left thoracostomy tube is present with left chest wall emphysema. The tip of the thoracostomy tube is adjacent to the right hilum. Lung volumes are markedly low, with bilateral basilar and right midlung perihilar subsegmental atelectasis.  The retrocardiac density is present, also likely representing atelectasis.  There is no definite left pneumothorax following thoracostomy tube placement.  Left-sided rib fractures are again noted.  IMPRESSION:   1.  Support apparatus appears in good position. 2.  No left-sided pneumothorax following left thoracostomy tube placement. 3.  Markedly low lung volumes with basilar and perihilar atelectasis.   Original Report Authenticated By: Andreas Newport, M.D.    Dg Chest Portable 1 View  06/18/2012  *RADIOLOGY REPORT*  Clinical Data: Chest tube placement, pneumothorax, rib fractures, fell from tree, flail chest  PORTABLE CHEST - 1 VIEW  Comparison: Portable exam 1615 hours compared to 06/18/2012 at 1558 hours  Findings: New left basilar thoracostomy tube. Decreased left pneumothorax. Low lung volumes with scattered infiltrate within left lung. Mild right basilar atelectasis. Multiple left rib fractures again seen. Left chest wall emphysema extending into axilla.  IMPRESSION: Decrease in left pneumothorax following left thoracostomy tube placement. Mild scattered infiltrate in left lung with minimal right basilar atelectasis. Multiple left rib fractures.   Original Report Authenticated By: Ulyses Southward, M.D.    Dg Chest Portable 1 View  06/18/2012  *RADIOLOGY REPORT*  Clinical Data: Fall from tree with flail chest.  PORTABLE CHEST - 1 VIEW  Comparison: None.  Findings: Single supine view of the chest was obtained.  There is lucency along the left lung base and findings are consistent with a deep sulcus sign and pneumothorax.  There is a large amount of subcutaneous gas.  There are multiple displaced left rib fractures.  The entire right side of the chest was not imaged.  Fractures involving the left fourth, fifth, sixth and seventh ribs.  The left fourth and fifth rib fractures may be segmental fractures.  IMPRESSION: Multiple left rib fractures with a pneumothorax.  Large amount of subcutaneous gas.   Original Report Authenticated By: Richarda Overlie, M.D.    Dg Hand Complete Left  06/18/2012  *RADIOLOGY REPORT*  Clinical Data: Injury  LEFT HAND - COMPLETE 3+ VIEW  Comparison: None.  Findings: There is an acute fracture  involving the proximal metaphysis of the proximal phalanx of the small finger.  Slight anterior displacement of the distal fragment.  Slight dorsal angulation.  There is also a spiral fracture involving the distal metaphysis of the third metacarpal with some displacement.  Soft tissue injury of the small finger may be present.  IMPRESSION: Acute fractures involving the proximal phalanx of the small finger and distal third metacarpal.   Original Report Authenticated By: Jolaine Click, M.D.     Review of Systems  Unable to perform ROS: intubated   Blood pressure 97/59, pulse 75, temperature 96.9 F (36.1 C), temperature source Axillary, resp. rate 15, height 6' (1.829 m), weight 106.595 kg (235 lb), SpO2 96.00%. Physical Exam  Constitutional: He appears well-developed and well-nourished.  Intubated and sedated  HENT:  Head: Normocephalic and atraumatic.  Cardiovascular: Normal rate and regular rhythm.   No murmur heard. Respiratory:  Breath sounds clear. Subcutaneous emphysema on the left chest. Chest tube in place with no air leak.  GI: Soft. He exhibits no distension. There is no tenderness.    Assessment/Plan:  He has multiple left-sided rib fractures from 3 through 12 but most of the fractures are posterior within a few centimeters of the transverse processes and are nondisplaced. These fractures are usually not amenable to fixation with titanium plates due to the difficulty in exposing this area and the lack of adequate rib adjacent to the transverse process to screw the plate into. These rib fractures are usually stabilized well by the paraspinous muscles. He does have a couple rib fractures more lateral but these are also nondisplaced with no chest wall deformity and I don't think they require fixation. I would recommend good pain control and weaning him from the ventilator. I discussed all of this with his wife.  BARTLE,BRYAN K 06/19/2012, 11:26 AM

## 2012-06-19 NOTE — Progress Notes (Signed)
Patient seen and examined.  Agree with PA's note.  

## 2012-06-20 ENCOUNTER — Inpatient Hospital Stay (HOSPITAL_COMMUNITY): Payer: Medicare Other

## 2012-06-20 DIAGNOSIS — R0902 Hypoxemia: Secondary | ICD-10-CM

## 2012-06-20 LAB — POCT I-STAT 3, ART BLOOD GAS (G3+)
TCO2: 27 mmol/L (ref 0–100)
pCO2 arterial: 51 mmHg — ABNORMAL HIGH (ref 35.0–45.0)
pH, Arterial: 7.3 — ABNORMAL LOW (ref 7.350–7.450)
pO2, Arterial: 65 mmHg — ABNORMAL LOW (ref 80.0–100.0)

## 2012-06-20 LAB — CBC
Hemoglobin: 10.1 g/dL — ABNORMAL LOW (ref 13.0–17.0)
RBC: 3.3 MIL/uL — ABNORMAL LOW (ref 4.22–5.81)

## 2012-06-20 MED ORDER — PRO-STAT SUGAR FREE PO LIQD
60.0000 mL | Freq: Four times a day (QID) | ORAL | Status: DC
  Administered 2012-06-20 – 2012-06-23 (×8): 60 mL
  Filled 2012-06-20 (×24): qty 60

## 2012-06-20 MED ORDER — FENTANYL CITRATE 0.05 MG/ML IJ SOLN
25.0000 ug | INTRAMUSCULAR | Status: DC | PRN
  Administered 2012-06-22 – 2012-06-23 (×3): 50 ug via INTRAVENOUS
  Filled 2012-06-20 (×3): qty 2

## 2012-06-20 MED ORDER — MIDAZOLAM HCL 2 MG/2ML IJ SOLN
2.0000 mg | INTRAMUSCULAR | Status: DC | PRN
  Administered 2012-06-20 – 2012-06-21 (×3): 2 mg via INTRAVENOUS
  Filled 2012-06-20 (×3): qty 2

## 2012-06-20 MED ORDER — DEXMEDETOMIDINE HCL IN NACL 200 MCG/50ML IV SOLN
0.2000 ug/kg/h | INTRAVENOUS | Status: DC
  Administered 2012-06-20: 0.6 ug/kg/h via INTRAVENOUS
  Administered 2012-06-20: 0.7 ug/kg/h via INTRAVENOUS
  Administered 2012-06-20: 0.3 ug/kg/h via INTRAVENOUS
  Administered 2012-06-20 – 2012-06-21 (×3): 0.7 ug/kg/h via INTRAVENOUS
  Filled 2012-06-20 (×3): qty 50
  Filled 2012-06-20: qty 100
  Filled 2012-06-20 (×2): qty 50

## 2012-06-20 NOTE — Progress Notes (Signed)
NUTRITION FOLLOW UP  Intervention:    Pivot 1.5 formula at 20 ml/hr with Prostat liquid protein 60 ml 4 times daily to provide 1520 total kcals (72% of estimated kcal needs), 165 gm protein (100% of estimated protein needs), 364 ml of free water  Liquid MVI daily via tube RD to follow for nutrition care plan  Nutrition Dx:   Inadequate oral intake related to inability to eat as evidenced by NPO status, ongoing  Goal:   EN to provide 60-70% of estimated calorie needs (22-25 kcals/kg ideal body weight) and 100% of estimated protein needs, based on ASPEN guidelines for permissive underfeeding in critically ill obese individuals, progressing  Monitor:   EN regimen & tolerance, respiratory status, weight, labs, I/O's  Assessment:   Patient s/p procedure 3/24:  I & D, ORIF PHALANGEAL FRACTURE, LEFT SMALL & LONG FINGERS, METACARPAL FRACTURE  Patient is currently intubated on ventilator support MV: 9.6 Temp: 37.2  Pivot 1.5 formula infusing at 25 ml/hr via OGT.  Propofol stopped this AM ---> RD to adjust EN regimen.  Height: Ht Readings from Last 1 Encounters:  06/18/12 6' (1.829 m)    Weight Status:   Wt Readings from Last 1 Encounters:  06/20/12 245 lb 9.5 oz (111.4 kg)    Body mass index is 33.3 kg/(m^2).  Re-estimated needs:  Kcal: 2094 Protein: 160-178 gm Fluid: 1.8-1.9 L  Skin: hand surgical incision, leg abrasion  Diet Order: NPO   Intake/Output Summary (Last 24 hours) at 06/20/12 1306 Last data filed at 06/20/12 1200  Gross per 24 hour  Intake 3650.81 ml  Output   1135 ml  Net 2515.81 ml    Labs:   Recent Labs Lab 06/18/12 1607 06/18/12 1622 06/19/12 0618  NA 139 138 141  K 3.5 3.7 4.8  CL 103 100 107  CO2  --  24 23  BUN 13 13 15   CREATININE 1.40* 1.18 1.08  CALCIUM  --  9.8 8.3*  GLUCOSE 207* 204* 135*    Scheduled Meds: . chlorhexidine  15 mL Mouth/Throat BID  . Chlorhexidine Gluconate Cloth  6 each Topical q morning - 10a  . feeding  supplement (PIVOT 1.5 CAL)  1,000 mL Per Tube Q24H  . multivitamin  5 mL Per Tube Daily  . mupirocin ointment  1 application Nasal BID  . pantoprazole  40 mg Oral Daily   Or  . pantoprazole (PROTONIX) IV  40 mg Intravenous Daily  . selenium  200 mcg Per Tube Daily  . vitamin C  1,000 mg Per Tube Q8H  . vitamin e  400 Units Per Tube Q8H    Continuous Infusions: . 0.9 % NaCl with KCl 20 mEq / L 100 mL/hr at 06/20/12 0513  . dexmedetomidine 0.4 mcg/kg/hr (06/20/12 1200)  . fentaNYL infusion INTRAVENOUS 120 mcg/hr (06/20/12 1117)  . propofol Stopped (06/20/12 0900)    Maureen Chatters, RD, LDN Pager #: [phone] After-Hours Pager #: [phone]

## 2012-06-20 NOTE — Progress Notes (Signed)
Vt increased to which is pt's 8cc/kg, and FiO2 increased to 60%. Pt tolerating vent changes at this time. RT will continue to monitor.

## 2012-06-20 NOTE — Progress Notes (Signed)
Follow up - Trauma and Critical Care  Patient Details:    Christopher Henderson is an 67 y.o. male.  Lines/tubes : Airway 7.5 mm (Active)  Secured at (cm) 24 cm 06/20/2012  8:21 AM  Measured From Lips 06/20/2012  8:21 AM  Secured Location Center 06/20/2012  8:21 AM  Secured By [employer] 06/20/2012  8:21 AM  Tube Holder Repositioned Yes 06/20/2012  8:21 AM  Cuff Pressure (cm H2O) 24 cm H2O 06/19/2012  8:17 PM  Site Condition Dry 06/20/2012  8:21 AM     Chest Tube 1 Left Pleural 28 Fr. (Active)  Suction -20 cm H2O 06/19/2012  8:00 PM  Chest Tube Air Leak None 06/19/2012  8:00 PM  Patency Intervention Tip/tilt 06/19/2012  8:00 PM  Drainage Description Serosanguineous 06/19/2012  8:00 PM  Dressing Status Clean;Dry;Intact 06/19/2012  8:00 PM  Site Assessment Clean;Dry;Intact 06/19/2012  8:00 AM  Output (mL) 0 mL 06/20/2012  4:00 AM     NG/OG Tube Orogastric Center mouth (Active)  Placement Verification Auscultation 06/19/2012  8:00 PM  Site Assessment Clean;Intact;Dry 06/19/2012  8:00 PM  Status Infusing tube feed 06/19/2012  8:00 PM  Drainage Appearance Brown 06/19/2012  8:00 AM  Gastric Residual 60 mL 06/19/2012  8:00 PM  Intake (mL) 25 mL 06/20/2012  6:00 AM  Output (mL) 150 mL 06/19/2012  6:00 AM     Urethral Catheter Straight-tip 14 Fr. (Active)  Indication for Insertion or Continuance of Catheter Urinary output monitoring 06/19/2012  8:00 PM  Site Assessment Clean;Intact 06/19/2012  8:00 PM  Collection Container Standard drainage bag 06/19/2012  8:00 PM  Securement Method Leg strap 06/19/2012  8:00 PM  Urinary Catheter Interventions Unclamped 06/18/2012 10:31 PM  Output (mL) 25 mL 06/20/2012  6:00 AM    Microbiology/Sepsis markers: Results for orders placed during the hospital encounter of 06/18/12  MRSA PCR SCREENING     Status: Abnormal   Collection Time    06/18/12 10:38 PM      Result Value Range Status   MRSA by PCR POSITIVE (*) NEGATIVE Final   Comment:            The GeneXpert  MRSA Assay (FDA     approved for NASAL specimens     only), is one component of a     comprehensive MRSA colonization     surveillance program. It is not     intended to diagnose MRSA     infection nor to guide or     monitor treatment for     MRSA infections.     RESULT CALLED TO, READ BACK BY AND VERIFIED WITH:     CALLED TO RN JENNIFER PARRISH [phone] @0001  THANEY    Anti-infectives:  Anti-infectives   Start     Dose/Rate Route Frequency Ordered Stop   06/19/12 1200  clindamycin (CLEOCIN) IVPB 600 mg     600 mg 100 mL/hr over 30 Minutes Intravenous 4 times per day 06/19/12 0854 06/20/12 0544   06/19/12 0600  vancomycin (VANCOCIN) IVPB 1000 mg/200 mL premix     1,000 mg 200 mL/hr over 60 Minutes Intravenous On call to O.R. 06/18/12 1713 06/19/12 0730   06/18/12 1715  vancomycin (VANCOCIN) IVPB 1000 mg/200 mL premix     1,000 mg 200 mL/hr over 60 Minutes Intravenous  Once 06/18/12 1714 06/18/12 1820      Best Practice/Protocols:  VTE Prophylaxis: Mechanical GI Prophylaxis: Proton Pump Inhibitor Continous Sedation  Consults: Treatment Team:  Payton Doughty  Laneta Simmers, MD    Events:  Subjective:    Overnight Issues: Patient is alert and cooperative on the ventilator.  Oxygen saturations are down a bit.  Objective:  Vital signs for last 24 hours: Temp:  [96.5 F (35.8 C)-99.6 F (37.6 C)] 98.9 F (37.2 C) (03/26 0821) Pulse Rate:  [72-96] 90 (03/26 0821) Resp:  [15-19] 16 (03/26 0821) BP: (85-121)/(50-94) 121/94 mmHg (03/26 0821) SpO2:  [88 %-99 %] 94 % (03/26 0821) FiO2 (%):  [40 %-60 %] 40 % (03/26 0821) Weight:  [111.4 kg (245 lb 9.5 oz)] 111.4 kg (245 lb 9.5 oz) (03/26 0500)  Hemodynamic parameters for last 24 hours:    Intake/Output from previous day: 03/25 0701 - 03/26 0700 In: 3846 [I.V.:3016; NG/GT:580; IV Piggyback:250] Out: 1125 [Urine:920; Chest Tube:205]  Intake/Output this shift:    Vent settings for last 24 hours: Vent Mode:  [-] PSV;CPAP FiO2  (%):  [40 %-60 %] 40 % Set Rate:  [15 bmp] 15 bmp Vt Set:  [550 mL] 550 mL PEEP:  [5 cmH20-8 cmH20] 5 cmH20 Pressure Support:  [5 cmH20] 5 cmH20 Plateau Pressure:  [17 cmH20-20 cmH20] 18 cmH20  Physical Exam:  General: alert and no respiratory distress Neuro: alert, oriented and nonfocal exam Resp: clear to auscultation bilaterally and crepitance in the chest wall CVS: regular rate and rhythm, S1, S2 normal, no murmur, click, rub or gallop GI: soft, nontender, BS WNL, no r/g and hypoactive BS Extremities: edema 1+  Results for orders placed during the hospital encounter of 06/18/12 (from the past 24 hour(s))  CBC     Status: Abnormal   Collection Time    06/20/12  3:55 AM      Result Value Range   WBC 7.1  4.0 - 10.5 K/uL   RBC 3.30 (*) 4.22 - 5.81 MIL/uL   Hemoglobin 10.1 (*) 13.0 - 17.0 g/dL   HCT 95.6 (*) 21.3 - 08.6 %   MCV 89.4  78.0 - 100.0 fL   MCH 30.6  26.0 - 34.0 pg   MCHC 34.2  30.0 - 36.0 g/dL   RDW 57.8  46.9 - 62.9 %   Platelets 107 (*) 150 - 400 K/uL     Assessment/Plan:   NEURO  Altered Mental Status:  agitation and sedation   Plan: Wean sedation for potential extubation  PULM  Chest Wall Trauma Multiple rib fx and Pneumothorax (traumatic)   Plan: Keep chest tube until after extubation and then try to remove within the next 48 hours  CARDIO  No specific issues   Plan: CPM  RENAL  Oliguria (probably hypovolemia)   Plan: May also be third spacing, may give Lasix  GI  No specific issues   Plan: CPM.  Hold tube feedings for extubation  ID  No known infectious source   Plan: CPM  HEME  Anemia acute blood loss anemia and anemia of critical illness)   Plan: No blood for now  ENDO No specific issues.   Plan: CPM  Global Issues  Try to wean to extubate.  ABG just done showed pH 7.30 with PaCO2 50, PaO2 was in the 60's.  Not adequate at this time.  Will keep off propofol and possibly use Prededex is more sedation needed.    LOS: 2 days   Additional  comments:I reviewed the patient's new clinical lab test results. cbc/bmet and I reviewed the patients new imaging test results. cxr  Critical Care Total Time*: 30 Minutes  Tauni Sanks O 06/20/2012  *  Care during the described time interval was provided by me and/or other providers on the critical care team.  I have reviewed this patient's available data, including medical history, events of note, physical examination and test results as part of my evaluation.

## 2012-06-20 NOTE — Progress Notes (Signed)
Pt more alert, attempting wean again. Pt tolerating at this time. RT will continue to monitor.

## 2012-06-20 NOTE — Progress Notes (Signed)
RT did recruitment maneuver again at this time with same settings as previously. PT still tolerating well with o2 sats increasing from 94% to 100% during procedure.

## 2012-06-20 NOTE — Progress Notes (Signed)
Pt's sats around 80% on 40% FiO2. Increased FiO2 to 60%, okay to do recruitment maneuvers per MD. Recruitment done, pt's O2 sat now at 100%. Pt tolerated maneuver well. RT will continue to monitor.

## 2012-06-20 NOTE — Progress Notes (Signed)
RT did recruitment maneuver on PT at this time. Switched PT to PC 30,100%, +5, rate 10 for 5 min. PT tolerated well. O2 sats 99%, no distress. RT will continue to assist as needed.

## 2012-06-20 NOTE — Progress Notes (Signed)
SpO2 in the 80's, pt placed back on full support. RT will continue to monitor.

## 2012-06-20 NOTE — Progress Notes (Signed)
Pt placed back on full support due to drop in SpO2. Pt O2 sat was around 87%. RT will continue to monitor.

## 2012-06-21 ENCOUNTER — Inpatient Hospital Stay (HOSPITAL_COMMUNITY): Payer: Medicare Other

## 2012-06-21 LAB — POCT I-STAT 3, ART BLOOD GAS (G3+)
Bicarbonate: 21.3 mEq/L (ref 20.0–24.0)
TCO2: 22 mmol/L (ref 0–100)
pCO2 arterial: 38.3 mmHg (ref 35.0–45.0)
pH, Arterial: 7.351 (ref 7.350–7.450)
pO2, Arterial: 51 mmHg — ABNORMAL LOW (ref 80.0–100.0)

## 2012-06-21 LAB — TRIGLYCERIDES: Triglycerides: 151 mg/dL — ABNORMAL HIGH (ref <150)

## 2012-06-21 LAB — CBC WITH DIFFERENTIAL/PLATELET
Basophils Absolute: 0 10*3/uL (ref 0.0–0.1)
HCT: 33.7 % — ABNORMAL LOW (ref 39.0–52.0)
Lymphocytes Relative: 9 % — ABNORMAL LOW (ref 12–46)
Neutro Abs: 10.3 10*3/uL — ABNORMAL HIGH (ref 1.7–7.7)
Platelets: 132 10*3/uL — ABNORMAL LOW (ref 150–400)
RBC: 3.7 MIL/uL — ABNORMAL LOW (ref 4.22–5.81)
RDW: 12.3 % (ref 11.5–15.5)
WBC: 12.3 10*3/uL — ABNORMAL HIGH (ref 4.0–10.5)

## 2012-06-21 LAB — BASIC METABOLIC PANEL
CO2: 21 mEq/L (ref 19–32)
Chloride: 105 mEq/L (ref 96–112)
Potassium: 4.6 mEq/L (ref 3.5–5.1)
Sodium: 136 mEq/L (ref 135–145)

## 2012-06-21 MED ORDER — DEXMEDETOMIDINE HCL IN NACL 400 MCG/100ML IV SOLN
0.4000 ug/kg/h | INTRAVENOUS | Status: DC
  Administered 2012-06-21: 0.7 ug/kg/h via INTRAVENOUS
  Administered 2012-06-21 (×2): 0.9 ug/kg/h via INTRAVENOUS
  Administered 2012-06-21: 0.7 ug/kg/h via INTRAVENOUS
  Administered 2012-06-22 (×2): 0.9 ug/kg/h via INTRAVENOUS
  Filled 2012-06-21 (×9): qty 100

## 2012-06-21 MED ORDER — FUROSEMIDE 10 MG/ML IJ SOLN
20.0000 mg | Freq: Two times a day (BID) | INTRAMUSCULAR | Status: AC
  Administered 2012-06-21 – 2012-06-22 (×4): 20 mg via INTRAVENOUS
  Filled 2012-06-21 (×4): qty 2

## 2012-06-21 NOTE — Progress Notes (Signed)
Follow up - Trauma and Critical Care  Patient Details:    Christopher Henderson is an 67 y.o. male.  Lines/tubes : Airway 7.5 mm (Active)  Secured at (cm) 24 cm 06/21/2012  8:00 AM  Measured From Lips 06/21/2012  8:00 AM  Secured Location Center 06/21/2012  8:00 AM  Secured By [employer] 06/21/2012  8:00 AM  Tube Holder Repositioned Yes 06/21/2012  8:00 AM  Cuff Pressure (cm H2O) 28 cm H2O 06/20/2012  8:15 PM  Site Condition Dry 06/21/2012  3:14 AM     Chest Tube 1 Left Pleural 28 Fr. (Active)  Suction -20 cm H2O 06/20/2012  8:00 PM  Chest Tube Air Leak None 06/20/2012  8:00 PM  Patency Intervention Tip/tilt 06/20/2012  8:00 AM  Drainage Description Serosanguineous 06/20/2012  8:00 PM  Dressing Status Clean;Dry;Intact 06/20/2012  8:00 PM  Dressing Intervention Dressing changed 06/20/2012  3:00 PM  Site Assessment Clean;Dry;Intact 06/20/2012  8:00 PM  Surrounding Skin Dry;Intact 06/20/2012  8:00 PM  Output (mL) 0 mL 06/20/2012  6:00 PM     NG/OG Tube Orogastric Center mouth (Active)  Placement Verification Auscultation 06/20/2012  8:00 PM  Site Assessment Dry;Clean;Intact 06/20/2012  8:00 PM  Status Infusing tube feed 06/20/2012  8:00 PM  Drainage Appearance Brown 06/19/2012  8:00 AM  Gastric Residual 20 mL 06/20/2012  1:00 PM  Intake (mL) 0 mL 06/20/2012  9:00 AM  Output (mL) 150 mL 06/19/2012  6:00 AM     Urethral Catheter Straight-tip 14 Fr. (Active)  Indication for Insertion or Continuance of Catheter Urinary output monitoring 06/20/2012  8:00 PM  Site Assessment Clean;Intact 06/20/2012  8:00 PM  Collection Container Standard drainage bag 06/20/2012  8:00 PM  Securement Method Leg strap 06/20/2012  8:00 PM  Urinary Catheter Interventions Unclamped 06/20/2012  8:00 PM  Output (mL) 150 mL 06/21/2012  6:00 AM    Microbiology/Sepsis markers: Results for orders placed during the hospital encounter of 06/18/12  MRSA PCR SCREENING     Status: Abnormal   Collection Time    06/18/12 10:38 PM   Result Value Range Status   MRSA by PCR POSITIVE (*) NEGATIVE Final   Comment:            The GeneXpert MRSA Assay (FDA     approved for NASAL specimens     only), is one component of a     comprehensive MRSA colonization     surveillance program. It is not     intended to diagnose MRSA     infection nor to guide or     monitor treatment for     MRSA infections.     RESULT CALLED TO, READ BACK BY AND VERIFIED WITH:     CALLED TO RN JENNIFER PARRISH [phone] @0001  THANEY    Anti-infectives:  Anti-infectives   Start     Dose/Rate Route Frequency Ordered Stop   06/19/12 1200  clindamycin (CLEOCIN) IVPB 600 mg     600 mg 100 mL/hr over 30 Minutes Intravenous 4 times per day 06/19/12 0854 06/20/12 0544   06/19/12 0600  vancomycin (VANCOCIN) IVPB 1000 mg/200 mL premix     1,000 mg 200 mL/hr over 60 Minutes Intravenous On call to O.R. 06/18/12 1713 06/19/12 0730   06/18/12 1715  vancomycin (VANCOCIN) IVPB 1000 mg/200 mL premix     1,000 mg 200 mL/hr over 60 Minutes Intravenous  Once 06/18/12 1714 06/18/12 1820      Best Practice/Protocols:  VTE Prophylaxis: Lovenox (  prophylaxtic dose) and Mechanical GI Prophylaxis: Proton Pump Inhibitor Continous Sedation  Consults: Treatment Team:  Alleen Borne, MD    Events:  Subjective:    Overnight Issues: No issues overnight.  Could not be weaned yesterday.  Very alert and awake.  Objective:  Vital signs for last 24 hours: Temp:  [98.4 F (36.9 C)-99.3 F (37.4 C)] 99.1 F (37.3 C) (03/27 0800) Pulse Rate:  [65-103] 71 (03/27 0800) Resp:  [10-19] 15 (03/27 0800) BP: (82-162)/(47-90) 82/52 mmHg (03/27 0700) SpO2:  [87 %-100 %] 92 % (03/27 0800) FiO2 (%):  [40 %-60 %] 40 % (03/27 0800) Weight:  [111.7 kg (246 lb 4.1 oz)] 111.7 kg (246 lb 4.1 oz) (03/27 0200)  Hemodynamic parameters for last 24 hours:    Intake/Output from previous day: 03/26 0701 - 03/27 0700 In: 3433.2 [I.V.:3018.2; NG/GT:415] Out: 1225 [Urine:1165;  Chest Tube:60]  Intake/Output this shift:    Vent settings for last 24 hours: Vent Mode:  [-] PRVC FiO2 (%):  [40 %-60 %] 40 % Set Rate:  [15 bmp] 15 bmp Vt Set:  [550 mL-620 mL] 620 mL PEEP:  [5 cmH20] 5 cmH20 Pressure Support:  [8 cmH20] 8 cmH20 Plateau Pressure:  [16 cmH20-24 cmH20] 24 cmH20  Physical Exam:  General: alert and no respiratory distress Neuro: alert, oriented and nonfocal exam Resp: diminished breath sounds LLL GI: soft, nontender, BS WNL, no r/g and hypoactive BS Extremities: no edema, no erythema, pulses WNL and edema 1+  Results for orders placed during the hospital encounter of 06/18/12 (from the past 24 hour(s))  POCT I-STAT 3, BLOOD GAS (G3+)     Status: Abnormal   Collection Time    06/20/12  8:52 AM      Result Value Range   pH, Arterial 7.300 (*) 7.350 - 7.450   pCO2 arterial 51.0 (*) 35.0 - 45.0 mmHg   pO2, Arterial 65.0 (*) 80.0 - 100.0 mmHg   Bicarbonate 25.0 (*) 20.0 - 24.0 mEq/L   TCO2 27  0 - 100 mmol/L   O2 Saturation 90.0     Acid-base deficit 2.0  0.0 - 2.0 mmol/L   Patient temperature 98.9 F     Collection site RADIAL, ALLEN'S TEST ACCEPTABLE     Drawn by RT     Sample type ARTERIAL    TRIGLYCERIDES     Status: Abnormal   Collection Time    06/21/12  3:20 AM      Result Value Range   Triglycerides 151 (*) <150 mg/dL     Assessment/Plan:   NEURO  Altered Mental Status:  sedation and but arousable   Plan: Wean sedation and have patient weaned from ventilator  PULM  Atelectasis/collapse (focal) Chest Wall Trauma multiple rib fractures   Plan: Conitnue to support  CARDIO  No issues   Plan: CPM  RENAL  More input than output.  Will need diuresis   Plan: Diuresis and possibly help with lungs and extubation  GI  No specific issues   Plan: CPM  ID  No known infections   Plan: CPM  HEME  Anemia acute blood loss anemia)   Plan: CBC from today is pending.  H/H has been drifting downward with no known severe injuries  ENDO No  known issues   Plan: CPM  Global Issues  Will diurese patient and attempt extubation later today.  This may help his Hgb stabilize in that he may be hemodiluting.  Wills tart ventilator weaning again.  LOS: 3 days   Additional comments:I reviewed the patient's new clinical lab test results. cbc/bmet are pending and I reviewed the patients new imaging test results. cxr  Critical Care Total Time*: 30 Minutes  Deloy Archey O 06/21/2012  *Care during the described time interval was provided by me and/or other providers on the critical care team.  I have reviewed this patient's available data, including medical history, events of note, physical examination and test results as part of my evaluation.

## 2012-06-21 NOTE — Progress Notes (Signed)
Upon shift assessment pt abdomen very distended and tight. OG auscultated over gastric bubble minimal residuals noted. Pt with complaints of nausea and vomiting. OG advanced and placed to suction. 1.2L residual received from tube. Abdomen less firm. Pt communicated he feels less full. MD notified orders received.

## 2012-06-22 ENCOUNTER — Inpatient Hospital Stay (HOSPITAL_COMMUNITY): Payer: Medicare Other

## 2012-06-22 LAB — CBC WITH DIFFERENTIAL/PLATELET
Eosinophils Relative: 1 % (ref 0–5)
HCT: 29.8 % — ABNORMAL LOW (ref 39.0–52.0)
Lymphocytes Relative: 12 % (ref 12–46)
Lymphs Abs: 1.3 10*3/uL (ref 0.7–4.0)
MCV: 90.9 fL (ref 78.0–100.0)
Platelets: 134 10*3/uL — ABNORMAL LOW (ref 150–400)
RBC: 3.28 MIL/uL — ABNORMAL LOW (ref 4.22–5.81)
WBC: 10.8 10*3/uL — ABNORMAL HIGH (ref 4.0–10.5)

## 2012-06-22 LAB — BASIC METABOLIC PANEL
CO2: 24 mEq/L (ref 19–32)
Calcium: 8.2 mg/dL — ABNORMAL LOW (ref 8.4–10.5)
Glucose, Bld: 121 mg/dL — ABNORMAL HIGH (ref 70–99)
Sodium: 141 mEq/L (ref 135–145)

## 2012-06-22 MED ORDER — LEVALBUTEROL HCL 0.63 MG/3ML IN NEBU
0.6300 mg | INHALATION_SOLUTION | Freq: Four times a day (QID) | RESPIRATORY_TRACT | Status: DC | PRN
  Administered 2012-06-22 – 2012-06-24 (×3): 0.63 mg via RESPIRATORY_TRACT
  Filled 2012-06-22 (×4): qty 3

## 2012-06-22 NOTE — Progress Notes (Signed)
 Fentanyl wasted in sink as witnessed by Larina Bras, RN.

## 2012-06-22 NOTE — Progress Notes (Signed)
Patient extubated as ordered per Dr. Lindie Spruce. OGT removed with extubation. No acute distress noted. Tolerated well.  VSS. Denies pain.

## 2012-06-22 NOTE — Progress Notes (Signed)
Follow up - Trauma and Critical Care  Patient Details:    Christopher Henderson is an 67 y.o. male.  Lines/tubes : Airway 7.5 mm (Active)  Secured at (cm) 24 cm 06/21/2012  3:30 PM  Measured From Lips 06/21/2012  3:30 PM  Secured Location Center 06/21/2012  3:30 PM  Secured By [employer] 06/21/2012  3:30 PM  Tube Holder Repositioned Yes 06/21/2012  3:30 PM  Cuff Pressure (cm H2O) 24 cm H2O 06/21/2012  3:30 PM  Site Condition Dry 06/21/2012  8:00 AM     Chest Tube 1 Left Pleural 28 Fr. (Active)  Suction -20 cm H2O 06/21/2012  8:00 PM  Chest Tube Air Leak None 06/21/2012  8:00 PM  Patency Intervention Tip/tilt 06/21/2012 10:00 AM  Drainage Description Serosanguineous 06/21/2012  8:00 PM  Dressing Status Clean;Dry;Intact 06/21/2012  8:00 PM  Dressing Intervention Dressing changed 06/20/2012  3:00 PM  Site Assessment Clean;Dry;Intact 06/21/2012  8:00 PM  Surrounding Skin Dry;Intact 06/21/2012  8:00 PM  Output (mL) 0 mL 06/21/2012  8:00 PM     NG/OG Tube Orogastric Center mouth (Active)  Placement Verification Auscultation 06/21/2012  8:00 PM  Site Assessment Dry;Clean;Intact 06/21/2012  8:00 PM  Status Infusing tube feed 06/21/2012  8:00 PM  Drainage Appearance Brown 06/19/2012  8:00 AM  Gastric Residual 20 mL 06/21/2012  4:00 PM  Intake (mL) 100 mL 06/22/2012 12:00 AM  Output (mL) 500 mL 06/22/2012  4:00 AM     Urethral Catheter Straight-tip 14 Fr. (Active)  Indication for Insertion or Continuance of Catheter Urinary output monitoring 06/21/2012  8:00 PM  Site Assessment Clean;Intact 06/21/2012  8:00 PM  Collection Container Standard drainage bag 06/21/2012  8:00 PM  Securement Method Leg strap 06/21/2012  8:00 PM  Urinary Catheter Interventions Unclamped 06/21/2012  8:00 PM  Output (mL) 100 mL 06/22/2012  6:00 AM    Microbiology/Sepsis markers: Results for orders placed during the hospital encounter of 06/18/12  MRSA PCR SCREENING     Status: Abnormal   Collection Time    06/18/12 10:38 PM     Result Value Range Status   MRSA by PCR POSITIVE (*) NEGATIVE Final   Comment:            The GeneXpert MRSA Assay (FDA     approved for NASAL specimens     only), is one component of a     comprehensive MRSA colonization     surveillance program. It is not     intended to diagnose MRSA     infection nor to guide or     monitor treatment for     MRSA infections.     RESULT CALLED TO, READ BACK BY AND VERIFIED WITH:     CALLED TO RN JENNIFER PARRISH [id number] @0001  THANEY    Anti-infectives:  Anti-infectives   Start     Dose/Rate Route Frequency Ordered Stop   06/19/12 1200  clindamycin (CLEOCIN) IVPB 600 mg     600 mg 100 mL/hr over 30 Minutes Intravenous 4 times per day 06/19/12 0854 06/20/12 0544   06/19/12 0600  vancomycin (VANCOCIN) IVPB 1000 mg/200 mL premix     1,000 mg 200 mL/hr over 60 Minutes Intravenous On call to O.R. 06/18/12 1713 06/19/12 0730   06/18/12 1715  vancomycin (VANCOCIN) IVPB 1000 mg/200 mL premix     1,000 mg 200 mL/hr over 60 Minutes Intravenous  Once 06/18/12 1714 06/18/12 1820      Best Practice/Protocols:  VTE Prophylaxis: Lovenox (  prophylaxtic dose) and Mechanical GI Prophylaxis: Proton Pump Inhibitor Continous Sedation  Consults: Treatment Team:  Alleen Borne, MD    Events:  Subjective:    Overnight Issues: Patient's abdomen was very distended and tense.  OGT passed further into the stomach and 1.2 liters of bilious aspirate returned.  He is otherwise doing okay.  Abdomen much softer now that content aspirated.  He is more sedated on the ventilator today with Precedex and Fentanyl.  Objective:  Vital signs for last 24 hours: Temp:  [98.6 F (37 C)-99.8 F (37.7 C)] 99.1 F (37.3 C) (03/28 0412) Pulse Rate:  [65-99] 72 (03/28 0600) Resp:  [14-26] 19 (03/28 0600) BP: (85-154)/(51-90) 108/68 mmHg (03/28 0600) SpO2:  [91 %-100 %] 100 % (03/28 0600) FiO2 (%):  [40 %-60 %] 50 % (03/28 0412) Weight:  [113 kg (249 lb 1.9 oz)] 113 kg  (249 lb 1.9 oz) (03/28 0500)  Hemodynamic parameters for last 24 hours:    Intake/Output from previous day: 03/27 0701 - 03/28 0700 In: 3453 [I.V.:2713; NG/GT:740] Out: 3735 [Urine:1955; Emesis/NG output:1750; Chest Tube:30]  Intake/Output this shift:    Vent settings for last 24 hours: Vent Mode:  [-] PRVC FiO2 (%):  [40 %-60 %] 50 % Set Rate:  [15 bmp] 15 bmp Vt Set:  [620 mL] 620 mL PEEP:  [5 cmH20-8 cmH20] 8 cmH20 Pressure Support:  [10 cmH20] 10 cmH20 Plateau Pressure:  [19 cmH20-31 cmH20] 21 cmH20  Physical Exam:  General: no respiratory distress Neuro: nonfocal exam and RASS -2 Resp: clear to auscultation bilaterally GI: distended and hypoactive BS Extremities: edema 1+  Results for orders placed during the hospital encounter of 06/18/12 (from the past 24 hour(s))  CBC WITH DIFFERENTIAL     Status: Abnormal   Collection Time    06/21/12  9:25 AM      Result Value Range   WBC 12.3 (*) 4.0 - 10.5 K/uL   RBC 3.70 (*) 4.22 - 5.81 MIL/uL   Hemoglobin 11.4 (*) 13.0 - 17.0 g/dL   HCT 16.1 (*) 09.6 - 04.5 %   MCV 91.1  78.0 - 100.0 fL   MCH 30.8  26.0 - 34.0 pg   MCHC 33.8  30.0 - 36.0 g/dL   RDW 40.9  81.1 - 91.4 %   Platelets 132 (*) 150 - 400 K/uL   Neutrophils Relative 84 (*) 43 - 77 %   Neutro Abs 10.3 (*) 1.7 - 7.7 K/uL   Lymphocytes Relative 9 (*) 12 - 46 %   Lymphs Abs 1.1  0.7 - 4.0 K/uL   Monocytes Relative 7  3 - 12 %   Monocytes Absolute 0.8  0.1 - 1.0 K/uL   Eosinophils Relative 1  0 - 5 %   Eosinophils Absolute 0.1  0.0 - 0.7 K/uL   Basophils Relative 0  0 - 1 %   Basophils Absolute 0.0  0.0 - 0.1 K/uL  BASIC METABOLIC PANEL     Status: Abnormal   Collection Time    06/21/12  9:25 AM      Result Value Range   Sodium 136  135 - 145 mEq/L   Potassium 4.6  3.5 - 5.1 mEq/L   Chloride 105  96 - 112 mEq/L   CO2 21  19 - 32 mEq/L   Glucose, Bld 186 (*) 70 - 99 mg/dL   BUN 23  6 - 23 mg/dL   Creatinine, Ser 7.82  0.50 - 1.35 mg/dL   Calcium  8.2 (*)  8.4 - 10.5 mg/dL   GFR calc non Af Amer 86 (*) >90 mL/min   GFR calc Af Amer >90  >90 mL/min  POCT I-STAT 3, BLOOD GAS (G3+)     Status: Abnormal   Collection Time    06/21/12  9:37 AM      Result Value Range   pH, Arterial 7.351  7.350 - 7.450   pCO2 arterial 38.3  35.0 - 45.0 mmHg   pO2, Arterial 51.0 (*) 80.0 - 100.0 mmHg   Bicarbonate 21.3  20.0 - 24.0 mEq/L   TCO2 22  0 - 100 mmol/L   O2 Saturation 85.0     Acid-base deficit 4.0 (*) 0.0 - 2.0 mmol/L   Patient temperature 97.9 F     Collection site RADIAL, ALLEN'S TEST ACCEPTABLE     Drawn by Operator     Sample type ARTERIAL    CBC WITH DIFFERENTIAL     Status: Abnormal   Collection Time    06/22/12  3:45 AM      Result Value Range   WBC 10.8 (*) 4.0 - 10.5 K/uL   RBC 3.28 (*) 4.22 - 5.81 MIL/uL   Hemoglobin 10.1 (*) 13.0 - 17.0 g/dL   HCT 95.6 (*) 21.3 - 08.6 %   MCV 90.9  78.0 - 100.0 fL   MCH 30.8  26.0 - 34.0 pg   MCHC 33.9  30.0 - 36.0 g/dL   RDW 57.8  46.9 - 62.9 %   Platelets 134 (*) 150 - 400 K/uL   Neutrophils Relative 79 (*) 43 - 77 %   Neutro Abs 8.5 (*) 1.7 - 7.7 K/uL   Lymphocytes Relative 12  12 - 46 %   Lymphs Abs 1.3  0.7 - 4.0 K/uL   Monocytes Relative 9  3 - 12 %   Monocytes Absolute 0.9  0.1 - 1.0 K/uL   Eosinophils Relative 1  0 - 5 %   Eosinophils Absolute 0.1  0.0 - 0.7 K/uL   Basophils Relative 0  0 - 1 %   Basophils Absolute 0.0  0.0 - 0.1 K/uL  BASIC METABOLIC PANEL     Status: Abnormal   Collection Time    06/22/12  3:45 AM      Result Value Range   Sodium 141  135 - 145 mEq/L   Potassium 4.0  3.5 - 5.1 mEq/L   Chloride 108  96 - 112 mEq/L   CO2 24  19 - 32 mEq/L   Glucose, Bld 121 (*) 70 - 99 mg/dL   BUN 28 (*) 6 - 23 mg/dL   Creatinine, Ser 5.28  0.50 - 1.35 mg/dL   Calcium 8.2 (*) 8.4 - 10.5 mg/dL   GFR calc non Af Amer 86 (*) >90 mL/min   GFR calc Af Amer >90  >90 mL/min     Assessment/Plan:   NEURO  Altered Mental Status:  sedation   Plan: Wean sedation and try to get  extubated now that he has been diuresed with Lasix  PULM  Acute on Chronic Respiratory Failure not due to CHF and Failure to Wean (due to chest wall instability and due to pain) Chest Wall Trauma multiple rib fractures   Plan: Try to wean off the ventilator  CARDIO  No issues   Plan: CPM  RENAL  Improved I's and O's with Lasix,  Not as much of a positive balance   Plan: Stop lasix after two doses today.  GI  Ileus with distension   Plan: Keep OGT on suction, will have to consider alternative nutrition  ID  No known infectious sources   Plan: CPM  HEME  Anemia anemia of critical illness)   Plan: No blood for now.  Platelet count is stable also  ENDO No specific issues   Plan: CPM  Global Issues  Now that he has been diuresed it may be easier to wean him from the ventilator.  Left chest tube output is not that much.  Will put to waterseal and possibly remove soon.    LOS: 4 days   Additional comments:I reviewed the patient's new clinical lab test results. cbc/bmet and I reviewed the patients new imaging test results. cxr  Critical Care Total Time*: 30 Minutes  Rihana Kiddy O 06/22/2012  *Care during the described time interval was provided by me and/or other providers on the critical care team.  I have reviewed this patient's available data, including medical history, events of note, physical examination and test results as part of my evaluation.

## 2012-06-22 NOTE — Progress Notes (Signed)
Patient ID: Christopher Henderson, male   DOB: June 22, 1945, 67 y.o.   MRN: [id number] Patient doing well s/p orif left long and ring fractures  Will need ot to make orthoplast volar splint and do dressing change on Monday

## 2012-06-22 NOTE — Progress Notes (Signed)
When I went into the room at 2200 to help Christopher Henderson back to bed and give him his 10pm meds, he was noted to have expiratory wheezing. Assessed his lungs, wheezing noted in throat and upper lobes. No PRN treatments ordered per MAR. Spoke with Dr. Carolynne Edouard; Xopenex  PRN order received. Thresa Ross RN

## 2012-06-22 NOTE — Procedures (Signed)
Extubation Procedure Note  Patient Details:   Name: Arnez Stoneking DOB: 20-Jul-1945 MRN: [id number]   Airway Documentation:     Evaluation  O2 sats: stable throughout Complications: No apparent complications Patient did tolerate procedure well. Bilateral Breath Sounds: Clear Suctioning: Airway Yes  Devra Dopp D 06/22/2012, 9:20 AM

## 2012-06-22 NOTE — ED Provider Notes (Addendum)
History/physical exam/procedure(Henderson) were performed by non-physician practitioner and as supervising physician I was immediately available for consultation/collaboration. I have reviewed all notes and am in agreement with care and plan.   Hilario Quarry, MD 06/22/12 1655   I performed a history and physical examination of Christopher Henderson and discussed his management with Dr. Ethelene Browns.  I agree with the history, physical, assessment, and plan of care, with the following exceptions: None  I was present for the following procedures: None Time Spent in Critical Care of the patient: None Time spent in discussions with the patient and family: 5  Christopher Henderson    Hilario Quarry, MD 07/21/12 1614  Hilario Quarry, MD 07/21/12 [phone]

## 2012-06-23 ENCOUNTER — Inpatient Hospital Stay (HOSPITAL_COMMUNITY): Payer: Medicare Other

## 2012-06-23 LAB — CBC WITH DIFFERENTIAL/PLATELET
Hemoglobin: 11.3 g/dL — ABNORMAL LOW (ref 13.0–17.0)
Lymphocytes Relative: 9 % — ABNORMAL LOW (ref 12–46)
Lymphs Abs: 1 10*3/uL (ref 0.7–4.0)
MCH: 30.1 pg (ref 26.0–34.0)
Monocytes Relative: 9 % (ref 3–12)
Neutro Abs: 9.8 10*3/uL — ABNORMAL HIGH (ref 1.7–7.7)
Neutrophils Relative %: 82 % — ABNORMAL HIGH (ref 43–77)
RBC: 3.75 MIL/uL — ABNORMAL LOW (ref 4.22–5.81)
WBC: 11.9 10*3/uL — ABNORMAL HIGH (ref 4.0–10.5)

## 2012-06-23 LAB — BASIC METABOLIC PANEL
BUN: 24 mg/dL — ABNORMAL HIGH (ref 6–23)
CO2: 23 mEq/L (ref 19–32)
Chloride: 107 mEq/L (ref 96–112)
Glucose, Bld: 147 mg/dL — ABNORMAL HIGH (ref 70–99)
Potassium: 3.4 mEq/L — ABNORMAL LOW (ref 3.5–5.1)

## 2012-06-23 NOTE — Progress Notes (Signed)
Orthopedic Tech Progress Note Patient Details:  Christopher Henderson Jan 26, 1946 [id number]  Ortho Devices Type of Ortho Device: Arm sling Ortho Device/Splint Interventions: Application   Cammer, Mickie Bail 06/23/2012, 12:54 PM

## 2012-06-23 NOTE — Evaluation (Signed)
Physical Therapy Evaluation Patient Details Name: Christopher Henderson MRN: [id number] DOB: [birth year] Today's Date: 06/23/2012 Time: 4098-1191 PT Time Calculation (min): 36 min  PT Assessment / Plan / Recommendation Clinical Impression  67 y.o. male admitted to [facility] s/p fall from 82' with multiple rib fractures, PTX, left hand laceration and fxs s/p percutaneous pinning and I&D, and right shin abrasion.  he presents today with CT and NG tube as well as other lines , but able to mobilize pretty well. We kept his left hand in sling and NWB when OOB.  He was able to walk, but had some difficulty with balance and increased WOB/DOE with gait despite being on O2 via [place].  He would benefit from PT acutely and may need HHPT and a cane at discharge depending on his progress    PT Assessment  Patient needs continued PT services    Follow Up Recommendations  Home health PT    Does the patient have the potential to tolerate intense rehabilitation     NA  Barriers to Discharge None None    Equipment Recommendations  Cane    Recommendations for Other Services   none  Frequency Min 3X/week    Precautions / Restrictions Precautions Precautions: Fall;Other (comment) Precaution Comments: multiple lines (chest tube, NG tube to suction),  Required Braces or Orthoses: Other Brace/Splint Other Brace/Splint: left arm sling, no orders for when to wear it or WB status of left hand, so kept NWB and wore sling OOB Restrictions Other Position/Activity Restrictions: Assuming NWB left hand.  No orders for WB status   Pertinent Vitals/Pain DOE increased to 3/4 with gait on O2 [place], but not getting good reading on monitor, so unable to tell if he was actually de sating.        Mobility  Bed Mobility Bed Mobility: Supine to Sit;Sitting - Scoot to Edge of Bed;Sit to Supine Supine to Sit: With rails;HOB elevated;3: Mod assist Sitting - Scoot to Edge of Bed: With rail;3: Mod assist Sit to Supine: 3: Mod assist;With  rail;HOB flat Details for Bed Mobility Assistance: mod assist to support trunk to get to sitting, pt pulling on PT with right hand to get up.  Mod assist to weight shfit hips on compliant mattress using draw pad to get EOB.  Mod assist to help both legs back into the bed to get to supine.   Transfers Transfers: Sit to Stand;Stand to Sit Sit to Stand: 4: Min assist;With upper extremity assist;From bed Stand to Sit: 4: Min assist;With upper extremity assist;To bed Details for Transfer Assistance: min assist to steady pt for balance Ambulation/Gait Ambulation/Gait Assistance: 4: Min assist Ambulation Distance (Feet): 120 Feet Assistive device: Other (Comment) (WC holding O2 and chest tube/multiple lines) Ambulation/Gait Assistance Details: Pt walked steering WC with one arm, PT assisting with steering and keeping pt balanced during gait.  Mildy staggering gait pattern especially when turning corners or navigating tighter spaces in his room.   Gait Pattern: Step-through pattern;Shuffle (staggering) General Gait Details: Mildly DOE with gait.  Pt on O2 [place] and not getting a good waveform reading on monitor. Not sure of accuracy of O2 reading, but he did likely de sat some with gait.  DOE 3/4 with gait.         PT Diagnosis: Difficulty walking;Abnormality of gait;Generalized weakness;Acute pain  PT Problem List: Decreased strength;Decreased activity tolerance;Decreased balance;Decreased mobility;Decreased knowledge of use of DME;Pain;Cardiopulmonary status limiting activity PT Treatment Interventions: DME instruction;Gait training;Stair training;Functional mobility training;Therapeutic activities;Therapeutic exercise;Balance training;Neuromuscular re-education;Patient/family  education   PT Goals Acute Rehab PT Goals PT Goal Formulation: With patient Time For Goal Achievement: 07/07/12 Potential to Achieve Goals: Good Pt will go Supine/Side to Sit: with supervision PT Goal: Supine/Side to Sit -  Progress: Goal set today Pt will go Sit to Supine/Side: with supervision PT Goal: Sit to Supine/Side - Progress: Goal set today Pt will go Sit to Stand: with supervision PT Goal: Sit to Stand - Progress: Goal set today Pt will go Stand to Sit: with supervision PT Goal: Stand to Sit - Progress: Goal set today Pt will Transfer Bed to Chair/Chair to Bed: with supervision PT Transfer Goal: Bed to Chair/Chair to Bed - Progress: Goal set today Pt will Ambulate: >150 feet;with supervision;with least restrictive assistive device PT Goal: Ambulate - Progress: Goal set today Pt will Go Up / Down Stairs: 3-5 stairs;with supervision;with rail(s) PT Goal: Up/Down Stairs - Progress: Goal set today  Visit Information  Last PT Received On: 06/23/12 Assistance Needed: +1    Subjective Data  Subjective: Pt reports that he was in a tree trimming some limbs when he fell.  Patient Stated Goal: to go home with his wife   Prior Functioning  Home Living Lives With: Spouse Available Help at Discharge: Family;Available 24 hours/day Type of Home: House Home Access: Stairs to enter [employer] of Steps: 4 Entrance Stairs-Rails: Right Home Layout: One level Home Adaptive Equipment: None Prior Function Level of Independence: Independent Able to Take Stairs?: Yes Driving: Yes Vocation: Retired Musician: No difficulties    Copywriter, advertising Overall Cognitive Status: Appears within functional limits for tasks assessed/performed Arousal/Alertness: Awake/alert Orientation Level: Appears intact for tasks assessed Behavior During Session: [facility] for tasks performed    Extremity/Trunk Assessment Right Lower Extremity Assessment RLE ROM/Strength/Tone: Deficits RLE ROM/Strength/Tone Deficits: grossly 3+/5 per functional assessment.  Pt mildly unsteady on his feet during gait and generally not feeling very strong.   Left Lower Extremity Assessment LLE ROM/Strength/Tone:  Deficits LLE ROM/Strength/Tone Deficits: grossly 3+/5 per functional assessment.  Pt mildly unsteady on his feet during gait and generally not feeling very strong.     Balance    End of Session PT - End of Session Equipment Utilized During Treatment: Oxygen Activity Tolerance: Patient limited by fatigue;Patient limited by pain;Treatment limited secondary to medical complications (Comment);Other (comment) (limited by DOE) Patient left: in bed;with call bell/phone within reach Nurse Communication: Mobility status    Lurena Joiner B. Luken Shadowens, PT, DPT [phone]   06/23/2012, 4:10 PM

## 2012-06-23 NOTE — Progress Notes (Signed)
5 Days Post-Op  Subjective: Complains of chest discomfort, but no SOB Passing flatus now  Objective: Vital signs in last 24 hours: Temp:  [97.7 F (36.5 C)-98.9 F (37.2 C)] 98.3 F (36.8 C) (03/29 0354) Pulse Rate:  [81-108] 93 (03/29 0700) Resp:  [17-24] 18 (03/29 0700) BP: (102-160)/(68-85) 134/77 mmHg (03/29 0700) SpO2:  [96 %-100 %] 100 % (03/29 0700) FiO2 (%):  [40 %] 40 % (03/28 1300) Weight:  [211 lb 13.8 oz (96.1 kg)] 211 lb 13.8 oz (96.1 kg) (03/29 0500)    Intake/Output from previous day: 03/28 0701 - 03/29 0700 In: 2013.1 [I.V.:1983.1; NG/GT:30] Out: 6130 [Urine:4030; Emesis/NG output:2050; Chest Tube:50] Intake/Output this shift:    Lungs clear Abdomen distended, tympanitic, non tender  Lab Results:   Recent Labs  06/22/12 0345 06/23/12 0501  WBC 10.8* 11.9*  HGB 10.1* 11.3*  HCT 29.8* 33.7*  PLT 134* 216   BMET  Recent Labs  06/22/12 0345 06/23/12 0501  NA 141 142  K 4.0 3.4*  CL 108 107  CO2 24 23  GLUCOSE 121* 147*  BUN 28* 24*  CREATININE 0.94 0.83  CALCIUM 8.2* 8.4   PT/INR No results found for this basename: LABPROT, INR,  in the last 72 hours ABG  Recent Labs  06/20/12 0852 06/21/12 0937  PHART 7.300* 7.351  HCO3 25.0* 21.3    Studies/Results: Dg Chest Port 1 View  06/23/2012  *RADIOLOGY REPORT*  Clinical Data: Rib fractures, left chest tube, extubated  PORTABLE CHEST - 1 VIEW  Comparison: 06/22/2012  Findings: Interval extubation.  NG tube enters the stomach with the tip not visualized on film.  Left chest tube unchanged.  Low lung volumes persist with vascular congestion and basilar atelectasis. Consolidation noted in the left lower lobe, slightly worse.  No pneumothorax.  Several acute displaced left rib fractures evident. Left chest subcutaneous emphysema noted.  IMPRESSION: Extubated.  Persistent low lung volumes with vascular congestion and basilar atelectasis  Slight increased left base consolidation  No pneumothorax    Original Report Authenticated By: Judie Petit. Miles Costain, M.D.    Dg Chest Port 1 View  06/22/2012  *RADIOLOGY REPORT*  Clinical Data: Previous pneumothorax  PORTABLE CHEST - 1 VIEW  Comparison: 06/21/2012  Findings: The heart and pulmonary vascularity are stable.  An endotracheal tube, nasogastric catheter and left thoracostomy catheter are again seen.  No pneumothorax is noted.  Persistent bibasilar atelectasis is noted.  No new focal abnormality is seen.  IMPRESSION: Stable appearance of the chest when compared with prior exam.   Original Report Authenticated By: Alcide Clever, M.D.    Dg Chest Port 1 View  06/21/2012  *RADIOLOGY REPORT*  Clinical Data: Chest tube evaluation.  PORTABLE CHEST - 1 VIEW  Comparison: 06/20/2012  Findings: Endotracheal tube, NG tube and left chest tube remain in place, unchanged.  No pneumothorax.  Low lung volumes with bibasilar atelectasis, slightly increased since prior study. Stable left subcutaneous emphysema.  Heart is borderline in size.  IMPRESSION: Low lung volumes, increasing bibasilar atelectasis.  No pneumothorax.   Original Report Authenticated By: Charlett Nose, M.D.    Dg Abd Portable 1v  06/23/2012  *RADIOLOGY REPORT*  Clinical Data: Abdominal pain, rib fractures, trauma  PORTABLE ABDOMEN - 1 VIEW  Comparison: 06/19/2012  Findings: NG tube extends into the second portion of the duodenum. Gaseous distention of the transverse colon evident.  No significant obstruction pattern.  Acute lower left rib fractures noted.  IMPRESSION: NG tube tip in the duodenum.  Gaseous  distention of the bowel could represent mild ileus.  No definite obstruction pattern  Lower left rib fractures   Original Report Authenticated By: Judie Petit. Miles Costain, M.D.     Anti-infectives: Anti-infectives   Start     Dose/Rate Route Frequency Ordered Stop   06/19/12 1200  clindamycin (CLEOCIN) IVPB 600 mg     600 mg 100 mL/hr over 30 Minutes Intravenous 4 times per day 06/19/12 0854 06/20/12 0544   06/19/12 0600   vancomycin (VANCOCIN) IVPB 1000 mg/200 mL premix     1,000 mg 200 mL/hr over 60 Minutes Intravenous On call to O.R. 06/18/12 1713 06/19/12 0730   06/18/12 1715  vancomycin (VANCOCIN) IVPB 1000 mg/200 mL premix     1,000 mg 200 mL/hr over 60 Minutes Intravenous  Once 06/18/12 1714 06/18/12 1820      Assessment/Plan: s/p Procedure(s): Irrigation and Debridement with wound exploration Left Hand and percutaneous pinning of left Fifth finger and third metacarpal (Left)  Ileus.  xrays show distended colon.  Slowly improving.  Will leave NG down for 24 more hours Pulmonary toilet  LOS: 5 days    Christopher Henderson 06/23/2012

## 2012-06-24 ENCOUNTER — Inpatient Hospital Stay (HOSPITAL_COMMUNITY): Payer: Medicare Other

## 2012-06-24 LAB — CBC
Hemoglobin: 13.5 g/dL (ref 13.0–17.0)
MCH: 30.5 pg (ref 26.0–34.0)
MCV: 90.7 fL (ref 78.0–100.0)
RBC: 4.42 MIL/uL (ref 4.22–5.81)

## 2012-06-24 MED ORDER — DEXTROSE 5 % IV SOLN
2.0000 g | Freq: Two times a day (BID) | INTRAVENOUS | Status: DC
  Administered 2012-06-24 – 2012-06-27 (×6): 2 g via INTRAVENOUS
  Filled 2012-06-24 (×10): qty 2

## 2012-06-24 NOTE — Progress Notes (Signed)
ANTIBIOTIC CONSULT NOTE - INITIAL  Pharmacy Consult for Cefepime Indication: rule out pneumonia  Allergies  Allergen Reactions  . Penicillins Hives    Patient Measurements: Height: 6' (182.9 cm) Weight: 222 lb 10.6 oz (101 kg) IBW/kg (Calculated) : 77.6   Vital Signs: Temp: 98.7 F (37.1 C) (03/30 0809) Temp src: Oral (03/30 0809) BP: 146/97 mmHg (03/30 0700) Pulse Rate: 101 (03/30 0700) Intake/Output from previous day: 03/29 0701 - 03/30 0700 In: 2365 [P.O.:580; I.V.:1725; NG/GT:60] Out: 3595 [Urine:1425; Emesis/NG output:1850; Chest Tube:320] Intake/Output from this shift: Total I/O In: 75 [I.V.:75] Out: 130 [Urine:100; Chest Tube:30]  Labs:  Recent Labs  06/21/12 0925 06/22/12 0345 06/23/12 0501 06/24/12 0530  WBC 12.3* 10.8* 11.9* 12.8*  HGB 11.4* 10.1* 11.3* 13.5  PLT 132* 134* 216 295  CREATININE 0.93 0.94 0.83  --    Estimated Creatinine Clearance: 109.2 ml/min (by C-G formula based on Cr of 0.83). No results found for this basename: VANCOTROUGH, Leodis Binet, VANCORANDOM, GENTTROUGH, GENTPEAK, GENTRANDOM, TOBRATROUGH, TOBRAPEAK, TOBRARND, AMIKACINPEAK, AMIKACINTROU, AMIKACIN,  in the last 72 hours   Microbiology: Recent Results (from the past 720 hour(s))  MRSA PCR SCREENING     Status: Abnormal   Collection Time    06/18/12 10:38 PM      Result Value Range Status   MRSA by PCR POSITIVE (*) NEGATIVE Final   Comment:            The GeneXpert MRSA Assay (FDA     approved for NASAL specimens     only), is one component of a     comprehensive MRSA colonization     surveillance program. It is not     intended to diagnose MRSA     infection nor to guide or     monitor treatment for     MRSA infections.     RESULT CALLED TO, READ BACK BY AND VERIFIED WITH:     CALLED TO RN JENNIFER PARRISH [phone] @0001  THANEY    Medical History: Past Medical History  Diagnosis Date  . Hypertension     Assessment: 67yo M here for Irrigation and Debridement of  Left Hand wound now with suspected PNA due to worsening consolidation on CXR. Respiratory cultures ordered, pt afebrile with slight leukocytosis now to start Cefepime. Patient does have penicillin allergy with hives, crossreactivity is low but should be monitored for. If reaction occurs could consider Levaquin.      Plan:  -Start Cefepime 2g q12h once respiratory cultures are obtained - f/u renal function, and cultures - monitor for allergic reaction   Vania Rea. Darin Engels.D. Clinical Pharmacist Pager [phone] Phone [phone] 06/24/2012 8:29 AM

## 2012-06-24 NOTE — Clinical Social Work Psychosocial (Signed)
Clinical Social Work Department BRIEF PSYCHOSOCIAL ASSESSMENT 06/24/2012  Patient:  Christopher Henderson,Christopher Henderson     Account Number:  [id number]     Admit date:  06/18/2012  Clinical Social Worker:  Oswaldo Done  Date/Time:  06/24/2012 04:44 PM  Referred by:  CSW  Date Referred:  06/22/2012 Referred for  Psychosocial assessment   Other Referral:   Interview type:   Other interview type:    PSYCHOSOCIAL DATA Living Status:  WIFE Admitted from facility:   Level of care:   Primary support name:  Rayshon Albaugh Primary support relationship to patient:  SPOUSE Degree of support available:   Adequate    CURRENT CONCERNS Current Concerns  None Noted   Other Concerns:    SOCIAL WORK ASSESSMENT / PLAN CSW met with patient, patient's wife and son, Luisa Hart at patient's bedside. ICSW introduced self, role and provided support. Patient has been retired since 2010. Lives at home with his wife who retired in December 2013. Completed SBIRT with patient. Patient denies ever drinking alcohol or using drugs. Agreeable to home health services, per PT recommendation, upon discharge from hospital.   Assessment/plan status:  Psychosocial Support/Ongoing Assessment of Needs Other assessment/ plan:   Information/referral to community resources:    PATIENT'S/FAMILY'S RESPONSE TO PLAN OF CARE: Patient and family thanked CSW for providing support. Please contact weekday CSW if any social needs identified prior to discharge.         Ricke Hey, [place] [phone] (weekend)

## 2012-06-24 NOTE — Progress Notes (Signed)
6 Days Post-Op  Subjective: Awake and breathing comfortably. No complaints  Objective: Vital signs in last 24 hours: Temp:  [97.3 F (36.3 C)-98.4 F (36.9 C)] 98.1 F (36.7 C) (03/30 0300) Pulse Rate:  [84-105] 101 (03/30 0700) Resp:  [18-25] 22 (03/30 0700) BP: (133-159)/(57-97) 146/97 mmHg (03/30 0700) SpO2:  [90 %-100 %] 90 % (03/30 0700) Weight:  [222 lb 10.6 oz (101 kg)] 222 lb 10.6 oz (101 kg) (03/30 0500) Last BM Date: 06/23/12  Intake/Output from previous day: 03/29 0701 - 03/30 0700 In: 2365 [P.O.:580; I.V.:1725; NG/GT:60] Out: 3595 [Urine:1425; Emesis/NG output:1850; Chest Tube:320] Intake/Output this shift: Total I/O In: 75 [I.V.:75] Out: 130 [Urine:100; Chest Tube:30]  Resp: diminished breath sounds anterior - left GI: soft, nontender. quiet  Lab Results:   Recent Labs  06/23/12 0501 06/24/12 0530  WBC 11.9* 12.8*  HGB 11.3* 13.5  HCT 33.7* 40.1  PLT 216 295   BMET  Recent Labs  06/22/12 0345 06/23/12 0501  NA 141 142  K 4.0 3.4*  CL 108 107  CO2 24 23  GLUCOSE 121* 147*  BUN 28* 24*  CREATININE 0.94 0.83  CALCIUM 8.2* 8.4   PT/INR No results found for this basename: LABPROT, INR,  in the last 72 hours ABG  Recent Labs  06/21/12 0937  PHART 7.351  HCO3 21.3    Studies/Results: Dg Chest Port 1 View  06/23/2012  *RADIOLOGY REPORT*  Clinical Data: Rib fractures, left chest tube, extubated  PORTABLE CHEST - 1 VIEW  Comparison: 06/22/2012  Findings: Interval extubation.  NG tube enters the stomach with the tip not visualized on film.  Left chest tube unchanged.  Low lung volumes persist with vascular congestion and basilar atelectasis. Consolidation noted in the left lower lobe, slightly worse.  No pneumothorax.  Several acute displaced left rib fractures evident. Left chest subcutaneous emphysema noted.  IMPRESSION: Extubated.  Persistent low lung volumes with vascular congestion and basilar atelectasis  Slight increased left base  consolidation  No pneumothorax   Original Report Authenticated By: Judie Petit. Shick, M.D.    Dg Abd Portable 1v  06/23/2012  *RADIOLOGY REPORT*  Clinical Data: Abdominal pain, rib fractures, trauma  PORTABLE ABDOMEN - 1 VIEW  Comparison: 06/19/2012  Findings: NG tube extends into the second portion of the duodenum. Gaseous distention of the transverse colon evident.  No significant obstruction pattern.  Acute lower left rib fractures noted.  IMPRESSION: NG tube tip in the duodenum.  Gaseous distention of the bowel could represent mild ileus.  No definite obstruction pattern  Lower left rib fractures   Original Report Authenticated By: Judie Petit. Miles Costain, M.D.     Anti-infectives: Anti-infectives   Start     Dose/Rate Route Frequency Ordered Stop   06/19/12 1200  clindamycin (CLEOCIN) IVPB 600 mg     600 mg 100 mL/hr over 30 Minutes Intravenous 4 times per day 06/19/12 0854 06/20/12 0544   06/19/12 0600  vancomycin (VANCOCIN) IVPB 1000 mg/200 mL premix     1,000 mg 200 mL/hr over 60 Minutes Intravenous On call to O.R. 06/18/12 1713 06/19/12 0730   06/18/12 1715  vancomycin (VANCOCIN) IVPB 1000 mg/200 mL premix     1,000 mg 200 mL/hr over 60 Minutes Intravenous  Once 06/18/12 1714 06/18/12 1820      Assessment/Plan: s/p Procedure(s): Irrigation and Debridement with wound exploration Left Hand and percutaneous pinning of left Fifth finger and third metacarpal (Left) will try to get respiratory culture and then start abx for probable pneumonia  given that he has increasing wbc and worsening consolidation on cxr D/c foley Hg improving  LOS: 6 days    TOTH III,Vincenta Steffey S 06/24/2012

## 2012-06-25 ENCOUNTER — Inpatient Hospital Stay (HOSPITAL_COMMUNITY): Payer: Medicare Other

## 2012-06-25 DIAGNOSIS — K560 Paralytic ileus: Secondary | ICD-10-CM

## 2012-06-25 MED ORDER — BOOST / RESOURCE BREEZE PO LIQD
1.0000 | Freq: Three times a day (TID) | ORAL | Status: DC
  Administered 2012-06-26 – 2012-06-27 (×4): 1 via ORAL

## 2012-06-25 MED ORDER — ENOXAPARIN SODIUM 40 MG/0.4ML ~~LOC~~ SOLN
40.0000 mg | SUBCUTANEOUS | Status: DC
  Administered 2012-06-25 – 2012-06-27 (×3): 40 mg via SUBCUTANEOUS
  Filled 2012-06-25 (×3): qty 0.4

## 2012-06-25 MED ORDER — AMLODIPINE BESYLATE 10 MG PO TABS
10.0000 mg | ORAL_TABLET | Freq: Every day | ORAL | Status: DC
  Administered 2012-06-25 – 2012-06-27 (×3): 10 mg via ORAL
  Filled 2012-06-25 (×4): qty 1

## 2012-06-25 MED ORDER — BENAZEPRIL HCL 40 MG PO TABS
40.0000 mg | ORAL_TABLET | Freq: Every day | ORAL | Status: DC
  Administered 2012-06-25 – 2012-06-27 (×3): 40 mg via ORAL
  Filled 2012-06-25 (×4): qty 1

## 2012-06-25 MED ORDER — POTASSIUM CHLORIDE IN NACL 20-0.9 MEQ/L-% IV SOLN
INTRAVENOUS | Status: DC
  Administered 2012-06-25: 20 mL via INTRAVENOUS
  Administered 2012-06-25: 20 mL/h via INTRAVENOUS
  Filled 2012-06-25: qty 1000

## 2012-06-25 NOTE — Progress Notes (Signed)
Dr Janee Morn informed of pt's RLE swelling and unsteady gait with that extremity

## 2012-06-25 NOTE — Progress Notes (Signed)
Physical Therapy Treatment Patient Details Name: Christopher Henderson MRN: [id number] DOB: 1945-12-17 Today's Date: 06/25/2012 Time: 7564-3329 PT Time Calculation (min): 25 min  PT Assessment / Plan / Recommendation Comments on Treatment Session  pt has made great progress with mobility.  Now more steady, oxygen has bee D/C'd,  pt showed no signs of dyspnea and sats were in the mid 90's or more.    Follow Up Recommendations  Home health PT     Does the patient have the potential to tolerate intense rehabilitation     Barriers to Discharge        Equipment Recommendations  None recommended by PT    Recommendations for Other Services    Frequency Min 3X/week   Plan Discharge plan remains appropriate;Frequency remains appropriate    Precautions / Restrictions Precautions Precaution Comments: lines and 1 chest tube Restrictions Weight Bearing Restrictions: Yes LUE Weight Bearing: Non weight bearing   Pertinent Vitals/Pain See comments above    Mobility  Bed Mobility Bed Mobility: Not assessed Transfers Transfers: Sit to Stand;Stand to Sit Sit to Stand: 5: Supervision Stand to Sit: 5: Supervision;To chair/3-in-1;Without upper extremity assist Details for Transfer Assistance: cuing to slow down and incr safety awareness Ambulation/Gait Ambulation/Gait Assistance: 5: Supervision Ambulation Distance (Feet): 800 Feet Assistive device:  (pushing IV pole) Ambulation/Gait Assistance Details: generally steady, but somewhat impulsive and rapid of movement without full thinking of lines/tubes and injuries. Gait Pattern: Step-through pattern;Antalgic;Wide base of support Stairs: Yes Stairs Assistance: 4: Min guard;4: Min assist Stair Management Technique: One rail Right;Alternating pattern;Step to pattern;Forwards;Other (comment) (step to going down, alt going up) Number of Stairs: 3 (limited by oines and tubes)    Exercises     PT Diagnosis:    PT Problem List:   PT Treatment  Interventions:     PT Goals Acute Rehab PT Goals PT Goal Formulation: With patient Time For Goal Achievement: 07/07/12 Potential to Achieve Goals: Good Pt will go Sit to Stand: with supervision PT Goal: Sit to Stand - Progress: Met Pt will go Stand to Sit: with supervision PT Goal: Stand to Sit - Progress: Met Pt will Transfer Bed to Chair/Chair to Bed: with supervision PT Transfer Goal: Bed to Chair/Chair to Bed - Progress: Met Pt will Ambulate: >150 feet;with supervision;with least restrictive assistive device PT Goal: Ambulate - Progress: Progressing toward goal Pt will Go Up / Down Stairs: 3-5 stairs;with supervision;with rail(s) PT Goal: Up/Down Stairs - Progress: Progressing toward goal  Visit Information  Last PT Received On: 06/25/12 Assistance Needed: +1    Subjective Data  Subjective: I walk 4-5 miles a day   Cognition  Cognition Overall Cognitive Status: Appears within functional limits for tasks assessed/performed Arousal/Alertness: Awake/alert Orientation Level: Appears intact for tasks assessed Behavior During Session: [facility] for tasks performed    Balance  Balance Balance Assessed: Yes Static Standing Balance Static Standing - Balance Support: During functional activity;No upper extremity supported;Right upper extremity supported Static Standing - Level of Assistance: 5: Stand by assistance  End of Session PT - End of Session Activity Tolerance: Patient tolerated treatment well Patient left: in chair;with call bell/phone within reach;with family/visitor present Nurse Communication: Mobility status   GP     Joyce Leckey, Eliseo Gum 06/25/2012, 10:43 AM 06/25/2012  Coppock Bing, PT [phone] (pager)

## 2012-06-25 NOTE — Progress Notes (Signed)
NUTRITION FOLLOW UP  Intervention:    Resource Breeze 3 times daily (250 kcals, 9 gm protein per 8 fl oz carton) RD to follow for nutrition care plan  New Nutrition Dx:   Increased nutrition needs related to trauma as evidenced by estimated nutrition needs  New Goal:   Oral intake with meals & supplements to meet >/= 90% of estimated nutrition needs  Monitor:   PO & supplemental intake, weight, labs, I/O's  Assessment:   Patient s/p procedure 3/24:  I & D, ORIF PHALANGEAL FRACTURE, LEFT SMALL & LONG FINGERS, METACARPAL FRACTURE  Patient extubated 3/28.  EN discontinued.  Post-op ileus resolved.  NGT out.  Advanced to Clear Liquids this AM. Would benefit from addition of nutrition supplements to promote healing & recovery ---> RD to order.  Transferring out of SICU today.  Height: Ht Readings from Last 1 Encounters:  06/18/12 6' (1.829 m)    Weight Status:   Wt Readings from Last 1 Encounters:  06/25/12 227 lb 4.7 oz (103.1 kg)    Re-estimated needs:  Kcal: 2200-2400 Protein: 120-130 gm Fluid: 2.2-2.4 L  Skin: hand surgical incision, leg abrasion  Diet Order: Clear Liquid   Intake/Output Summary (Last 24 hours) at 06/25/12 1114 Last data filed at 06/25/12 0900  Gross per 24 hour  Intake   1810 ml  Output   2663 ml  Net   -853 ml    Last BM: 3/30  Labs:   Recent Labs Lab 06/21/12 0925 06/22/12 0345 06/23/12 0501  NA 136 141 142  K 4.6 4.0 3.4*  CL 105 108 107  CO2 21 24 23   BUN 23 28* 24*  CREATININE 0.93 0.94 0.83  CALCIUM 8.2* 8.2* 8.4  GLUCOSE 186* 121* 147*    Scheduled Meds: . ceFEPime (MAXIPIME) IV  2 g Intravenous Q12H  . chlorhexidine  15 mL Mouth/Throat BID  . enoxaparin (LOVENOX) injection  40 mg Subcutaneous Q24H  . feeding supplement  60 mL Per Tube QID  . pantoprazole  40 mg Oral Daily   Or  . pantoprazole (PROTONIX) IV  40 mg Intravenous Daily  . selenium  200 mcg Per Tube Daily  . vitamin C  1,000 mg Per Tube Q8H  . vitamin  e  400 Units Per Tube Q8H    Continuous Infusions: . 0.9 % NaCl with KCl 20 mEq / L 75 mL/hr at 06/25/12 0700    Maureen Chatters, RD, LDN Pager #: [phone] After-Hours Pager #: [phone]

## 2012-06-25 NOTE — Evaluation (Addendum)
Occupational Therapy Evaluation Patient Details Name: Decker Cogdell MRN: [id number] DOB: Jul 11, 1945 Today's Date: 06/25/2012 Time: 4098-1191 OT Time Calculation (min): 129 min  OT Assessment / Plan / Recommendation Clinical Impression  67 yo male to [facility] s/p fall from 15' with multiple rib fractures, PTX, left hand laceration and fxs s/p percutaneous pinning and I&D,  and right shin abrasion. Ot to follow acutely. Recommend follow up with outpatient for hand splint when appropriate.     OT Assessment  Patient needs continued OT Services    Follow Up Recommendations  Outpatient OT    Barriers to Discharge      Equipment Recommendations       Recommendations for Other Services    Frequency  Min 4X/week (4x week for splint follow up )    Precautions / Restrictions Restrictions LUE Weight Bearing: Non weight bearing   Pertinent Vitals/Pain Reports no pain in hand at all throughout session    ADL  ADL Comments: Session focused on fabrication of Volar splint on Lt hand with dressing change. Pt's surgerical splint removed slowly and carefully moved around external pins. Pt with sterile water applied to gauze and lightly wiping hand dorsal/ volar surface to remove dryed xeoform. Pt provided new xeoform around pin sites and gauze placed between 2nd - 5th digits to keep in neutral alignment. Pt noted to have blister at styloid process of the ulna and just medial at the wrist an open wound the size of a nail head. xeoform was placed over the two sites in addition to the surgical sites with stitches and pins. Pt with large wound on volar aspect of hand from the 2nd MCP to the 5th MCP with xeoform applied over the site. A volar splint fabricated to block MCP into neutral with thumb free for AROM. Pt provided a clam shell dome top to protect the pin sites on the dorsal aspect of the hand when performing ADLS and ambulating. Pt tolerating splint well and will recheck    OT Diagnosis: Generalized  weakness  OT Problem List: Decreased strength;Decreased range of motion;Decreased activity tolerance;Impaired balance (sitting and/or standing);Impaired UE functional use OT Treatment Interventions: Self-care/ADL training;DME and/or AE instruction;Therapeutic activities;Balance training;Patient/family education   OT Goals Acute Rehab OT Goals Time For Goal Achievement: 07/09/12 Potential to Achieve Goals: Good  Visit Information  Last OT Received On: 06/25/12    Subjective Data  Subjective: "Are you from [facility]? 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    Prior Functioning     Home Living Lives With: Spouse Available Help at Discharge: Family;Available 24 hours/day Type of Home: House Home Access: Stairs to enter [employer] of Steps: 4 Entrance Stairs-Rails: Right Home Layout: One level Home Adaptive Equipment: None Prior Function Level of Independence: Independent Able to Take Stairs?: Yes Driving: Yes Vocation: Retired Musician: No difficulties Dominant Hand: Right         Vision/Perception     Copywriter, advertising Overall Cognitive Status: Appears within functional limits for tasks assessed/performed Arousal/Alertness: Awake/alert Orientation Level: Appears intact for tasks assessed Behavior During Session: [facility] for tasks performed    Extremity/Trunk Assessment Right Upper Extremity Assessment RUE ROM/Strength/Tone: Within functional levels RUE Sensation: WFL - Light Touch RUE Coordination: WFL - gross/fine motor Left Upper Extremity Assessment LUE ROM/Strength/Tone: Deficits LUE ROM/Strength/Tone Deficits: WFL AROM ELBOW , Shoulder AROM WFL- pt stabilized for neutral wrist and MCP positioning      Mobility  Transfers Sit to Stand: 5: Supervision Stand to Sit: 5: Supervision;To chair/3-in-1;Without upper extremity assist     Exercise     Balance     End of  Session OT - End of Session Patient left: in chair;with call bell/phone within reach Nurse Communication: Precautions;Mobility status  GO   GOALS  Goals~  Pt will tolerate splint without skin break down or pressure points for entire day  -progressing  Pt will maintain MCP neutral position with splint don throughout the day  - progressing   Lucile Shutters 06/25/2012, 2:45 PM Pager: [phone]

## 2012-06-25 NOTE — Progress Notes (Signed)
Occupational Therapy Treatment Patient Details Name: Christopher Henderson MRN: [id number] DOB: 27-Nov-1945 Today's Date: 06/25/2012 Time: 5784-6962 OT Time Calculation (min): 31 min  OT Assessment / Plan / Recommendation  ADL  OT arrived to check splint and make motivations. Pt with MCP flexion with current straps on the volar splint. Ot removed volar splint to mold the MCP and palm aspect of the splint for improved fit to a neutral position. The splint was modified at the distal aspect to decrease pressure points. RN arriving to transport patient to new room so patient placed in splint and OT to return later today to modify straps for better fit       OT Goals   Goals~  Pt will tolerate splint without skin break down or pressure points for entire day  -progressing  Pt will maintain MCP neutral position with splint don throughout the day  - progressing   Last OT Received On: 06/25/12             Cognition Overall Cognitive Status: Appears within functional limits for tasks assessed/performed Arousal/Alertness: Awake/alert Orientation Level: Appears intact for tasks assessed Behavior During Session: [facility] for tasks performed                    GO No pain during session     Lucile Shutters 06/25/2012, 5:32 PM

## 2012-06-25 NOTE — Progress Notes (Signed)
Occupational Therapy Treatment Patient Details Name: Howard Bunte MRN: [id number] DOB: Aug 23, 1945 Today's Date: 06/25/2012 Time: 1478-2956 OT Time Calculation (min): 31 min  OT Assessment / Plan / Recommendation  ADL  ADL Comments: Splint removed and holes placed in the medial and lateral aspects for strap to pass directly above MCP and anterior of pins. Pt with new velcro applied to volar splint to move strap on the dorsal aspect of the hand at a diagonal angle between the 4 pin sites. heating gun used to smooth edges and apply the hook velcro. Pt tolerating splint well. No pain reported. Ot to recheck splint on 06/26/12       Goals~  Pt will tolerate splint without skin break down or pressure points for entire day -progressing Pt will maintain MCP neutral position with splint don throughout the day - progressing Visit Information  Last OT Received On: 06/25/12    Subjective Data      Prior Functioning       Cognition  Cognition Overall Cognitive Status: Appears within functional limits for tasks assessed/performed Arousal/Alertness: Awake/alert Orientation Level: Appears intact for tasks assessed Behavior During Session: [facility] for tasks performed    Mobility       Exercises      Balance     End of Session    GO     Lucile Shutters 06/25/2012, 5:28 PM

## 2012-06-25 NOTE — Progress Notes (Signed)
Patient ID: Christopher Henderson, male   DOB: [birth year], 67 y.o.   MRN: [id number] 7 Days Post-Op  Subjective: Several BMs, no SOB  Objective: Vital signs in last 24 hours: Temp:  [97.8 F (36.6 C)-98.7 F (37.1 C)] 98.4 F (36.9 C) (03/31 0725) Pulse Rate:  [58-108] 85 (03/31 0700) Resp:  [15-22] 22 (03/31 0700) BP: (131-165)/(65-127) 144/86 mmHg (03/31 0700) SpO2:  [91 %-96 %] 92 % (03/31 0700) Weight:  [103.1 kg (227 lb 4.7 oz)] 103.1 kg (227 lb 4.7 oz) (03/31 0300) Last BM Date: 06/24/12  Intake/Output from previous day: 03/30 0701 - 03/31 0700 In: 1960 [I.V.:1800; NG/GT:60; IV Piggyback:100] Out: 2818 [Urine:1225; Emesis/NG output:1400; Stool:3; Chest Tube:190] Intake/Output this shift:    General appearance: alert and cooperative Nose: NGT Resp: CTA, sl decreased at bases Cardio: regular rate and rhythm GI: soft, NT, ND, +BS Extremities: right shin abrasion, calf soft, foot ecchymoses but NT Neuro: A&O, F/C  Lab Results: CBC   Recent Labs  06/23/12 0501 06/24/12 0530  WBC 11.9* 12.8*  HGB 11.3* 13.5  HCT 33.7* 40.1  PLT 216 295   BMET  Recent Labs  06/23/12 0501  NA 142  K 3.4*  CL 107  CO2 23  GLUCOSE 147*  BUN 24*  CREATININE 0.83  CALCIUM 8.4   PT/INR No results found for this basename: LABPROT, INR,  in the last 72 hours ABG No results found for this basename: PHART, PCO2, PO2, HCO3,  in the last 72 hours  Studies/Results: Dg Chest Port 1 View  06/24/2012  *RADIOLOGY REPORT*  Clinical Data: Left chest tube and left rib fractures.  PORTABLE CHEST - 1 VIEW  Comparison: 06/23/2012  Findings: Stable position of the left chest tube.  No significant pneumothorax.  Again noted are displaced left rib fracture. Increased densities in the left lung could represent atelectasis or contusions.  Nasogastric tube extends into the abdomen. Right lung is clear.  Stable appearance of the heart.  IMPRESSION: Low lung volumes without a pneumothorax.  Slightly increased  densities in the left lung could be related to atelectasis or contusions.  Stable position of the left chest tube.  Displaced left rib fractures.   Original Report Authenticated By: Richarda Overlie, M.D.     Anti-infectives: Anti-infectives   Start     Dose/Rate Route Frequency Ordered Stop   06/24/12 1400  ceFEPIme (MAXIPIME) 2 g in dextrose 5 % 50 mL IVPB     2 g 100 mL/hr over 30 Minutes Intravenous Every 12 hours 06/24/12 1128     06/19/12 1200  clindamycin (CLEOCIN) IVPB 600 mg     600 mg 100 mL/hr over 30 Minutes Intravenous 4 times per day 06/19/12 0854 06/20/12 0544   06/19/12 0600  vancomycin (VANCOCIN) IVPB 1000 mg/200 mL premix     1,000 mg 200 mL/hr over 60 Minutes Intravenous On call to O.R. 06/18/12 1713 06/19/12 0730   06/18/12 1715  vancomycin (VANCOCIN) IVPB 1000 mg/200 mL premix     1,000 mg 200 mL/hr over 60 Minutes Intravenous  Once 06/18/12 1714 06/18/12 1820      Assessment/Plan: s/p Procedure(s): Irrigation and Debridement with wound exploration Left Hand and percutaneous pinning of left Fifth finger and third metacarpal Fall Multiple left rib fxs w/HTPX s/p CT -- check CXR and D/C CT if OK, output recording limited due to pleurevac tipping over Open left 5th proximal phalanx/3rd MC fxs s/p ORIF -- NWB Grade 2 splenic laceration -- Hb up yesterday ARF -- resolved  HTN -- Home meds FEN -- ileus resolved, D/C NGT and start clears VTE -- SCD's, Hb stable with Grade 2 spleen, add lovenox Dispo -- to floor later today   LOS: 7 days    Violeta Gelinas, MD, MPH, FACS Pager: [phone]  06/25/2012

## 2012-06-25 NOTE — Progress Notes (Signed)
Received pt. From 2300 , report given by Acey Lav RN

## 2012-06-26 ENCOUNTER — Inpatient Hospital Stay (HOSPITAL_COMMUNITY): Payer: Medicare Other

## 2012-06-26 DIAGNOSIS — S92301A Fracture of unspecified metatarsal bone(s), right foot, initial encounter for closed fracture: Secondary | ICD-10-CM

## 2012-06-26 DIAGNOSIS — S42102A Fracture of unspecified part of scapula, left shoulder, initial encounter for closed fracture: Secondary | ICD-10-CM

## 2012-06-26 LAB — CBC
HCT: 32.7 % — ABNORMAL LOW (ref 39.0–52.0)
MCH: 30.2 pg (ref 26.0–34.0)
MCV: 87.4 fL (ref 78.0–100.0)
Platelets: 281 10*3/uL (ref 150–400)
RDW: 12.6 % (ref 11.5–15.5)
WBC: 10.8 10*3/uL — ABNORMAL HIGH (ref 4.0–10.5)

## 2012-06-26 LAB — CULTURE, RESPIRATORY W GRAM STAIN

## 2012-06-26 MED ORDER — HYDROCODONE-ACETAMINOPHEN 10-325 MG PO TABS: 0.5000 | ORAL_TABLET | ORAL | Status: DC | PRN

## 2012-06-26 MED ORDER — OXYCODONE-ACETAMINOPHEN 5-325 MG PO TABS
1.0000 | ORAL_TABLET | ORAL | Status: DC | PRN
  Administered 2012-06-26: 1 via ORAL
  Filled 2012-06-26 (×2): qty 1

## 2012-06-26 NOTE — Discharge Instructions (Signed)
You can increase your left shoulder motion as comfort allows. For your right foot, you should wear the boot when you are up and ambulating. Try no to put weight thru your forefoot, but you can put weight on your right heel for balance. Ice and elevation for swelling.

## 2012-06-26 NOTE — Progress Notes (Signed)
OT NOTE  Dressing removed from Lt hand with noted drainage on the volar aspect at the MCP wound site. Pt with blister noted at the styloid process of the ulna to have completely flattened and healing. The open wound noted to the medial aspect of the blister is now with a scab and healing appropriately. The pin site at the MCP long digit noted to have lengthened slightly since 06/25/12. Pt reports no pain and no redness at the wound site. Pt with xeoform placed over pin sites, blister site, open small nail head size wound site, and volar aspect MCP wound. Pt with x2 gauze size 5 by 5 placed over volar palm area due to drainage. Gauze placed between digits. Pt with hand and forearm wrapped in gauze. Pt placed in splint  Pt with splint modifications to allow PIP clearance while preventing MCP flexion and placing MCP in neutral position. Pt with clam shell top modified with the adding of velcro to prevent sliding. Pt reports no pain and no pressure points found this session.    Christopher Henderson   OTR/L Pager: [phone] Office: [phone] Time [phone] Charge Visit Ortho fit and 2 splint check .

## 2012-06-26 NOTE — Progress Notes (Signed)
Patient ID: Christopher Henderson, male   DOB: 01-Mar-1946, 67 y.o.   MRN: [id number]   LOS: 8 days    Subjective: Doing well, only c/o is right foot and pain in chest when he moves but otherwise not having much pain.   Objective: Vital signs in last 24 hours: Temp:  [97.8 F (36.6 C)-98.5 F (36.9 C)] 98.4 F (36.9 C) (04/01 0503) Pulse Rate:  [67-91] 79 (04/01 0503) Resp:  [18-24] 18 (04/01 0503) BP: (132-152)/(70-88) 136/82 mmHg (04/01 0503) SpO2:  [90 %-100 %] 94 % (04/01 0503) Last BM Date: 06/24/12   IS:   CT No air leak 28ml/24h (apparently significant serous drainage around tube) @30ml    Laboratory  CBC  Recent Labs  06/24/12 0530 06/26/12 0612  WBC 12.8* 10.8*  HGB 13.5 11.3*  HCT 40.1 32.7*  PLT 295 281    Radiology Results CXR: No significant PTX (official read pending)   Physical Exam General appearance: alert and no distress Resp: clear to auscultation bilaterally Cardio: regular rate and rhythm GI: normal findings: bowel sounds normal and soft, non-tender Pulses: 2+ and symmetric BLE   Assessment/Plan: Fall  Multiple left rib fxs w/HTPX s/p CT -- D/C CT Open left 5th proximal phalanx/3rd MC fxs s/p ORIF -- NWB  Left scapula fx Right MT fxs -- Magnus Ivan to see Grade 2 splenic laceration  ABL anemia -- Mild ID -- Maxipime D#3 empiric for PNA, culture pending, WBC improved, afebrile HTN -- Home meds  FEN -- Advance diet, drop pain meds to hydrocodone VTE -- SCD's, Lovenox Dispo -- Foot fxs    Freeman Caldron, PA-C Pager: [phone] General Trauma PA Pager: [phone]   06/26/2012

## 2012-06-26 NOTE — Progress Notes (Signed)
0430 Patient call for assistance back to bed. Patient had slid down to the end to the bed and stood to void on the opposite side of the chest tube. Patient assisted back to bed and instructed that he was not to get up without assistance. Patient's gown wet at site of chest tube dressing. Chest tube dressing saturated with serousanganous drainage. Chest tube dressing changed and occlusive dressing applied. No air leak noted, vss. Call light within reach and patient again told to call for assistance.  Bed alarm applied. Will continue to monitor.

## 2012-06-26 NOTE — Progress Notes (Signed)
Subjective: Patient awake and alert sitting up in bed. We were called due to patient's increased swelling and pain in right foot now that he is up ambulating.  Objective: Vital signs in last 24 hours: Temp:  [97.8 F (36.6 C)-98.5 F (36.9 C)] 98.4 F (36.9 C) (04/01 1432) Pulse Rate:  [77-96] 77 (04/01 1432) Resp:  [18-22] 21 (04/01 1432) BP: (131-152)/(60-84) 131/60 mmHg (04/01 1432) SpO2:  [92 %-95 %] 93 % (04/01 1432)  Intake/Output from previous day: 03/31 0701 - 04/01 0700 In: 1335.7 [P.O.:760; I.V.:575.7] Out: 1052 [Urine:1000; Chest Tube:52] Intake/Output this shift: Total I/O In: -  Out: 350 [Urine:350]   Recent Labs  06/24/12 0530 06/26/12 0612  HGB 13.5 11.3*    Recent Labs  06/24/12 0530 06/26/12 0612  WBC 12.8* 10.8*  RBC 4.42 3.74*  HCT 40.1 32.7*  PLT 295 281   No results found for this basename: NA, K, CL, CO2, BUN, CREATININE, GLUCOSE, CALCIUM,  in the last 72 hours No results found for this basename: LABPT, INR,  in the last 72 hours  Right lower extremity: Left foot with dorsal edema and bruising  No gross deformities  Rads: Three view Right foot  Fractures of the base of the second, third, fourth, and fifth  metatarsals.   Assessment/Plan: Right foot 2nd- 5th metatarsal fractures mild displacement Non -weight bearing in CAM walker boot right foot, Can place heel down for balance only Does not need to sleep in CAM boot Elevate right leg above heart wiggle toes often Follow-up with Dr. Magnus Ivan in 2weeks  [place], Sullivan Lone 06/26/2012, 3:31 PM

## 2012-06-26 NOTE — Progress Notes (Signed)
Physical Therapy Treatment Patient Details Name: Christopher Henderson MRN: [id number] DOB: 07/31/1945 Today's Date: 06/26/2012 Time: 1131-1204 PT Time Calculation (min): 33 min  PT Assessment / Plan / Recommendation Comments on Treatment Session  Pt instructed in the use of a PFRW today. Pt was able to maintain R LE NWB with platfrom, but with a lot of effort. Pt is requesting the use of a Knee scooter walker if available. Pt is making good progress towards goals. Pt does require continued reinforcement to slow down for improved safety.    Follow Up Recommendations  Home health PT     Does the patient have the potential to tolerate intense rehabilitation     Barriers to Discharge        Equipment Recommendations  Other (comment) (knee scooter walker vs. PFRW)    Recommendations for Other Services    Frequency     Plan Discharge plan remains appropriate    Precautions / Restrictions Precautions Precautions: Fall Precaution Comments: a little impulsive Restrictions Weight Bearing Restrictions: Yes LUE Weight Bearing: Weight bear through elbow only RLE Weight Bearing: Non weight bearing   Pertinent Vitals/Pain     Mobility  Bed Mobility Bed Mobility: Sit to Supine;Scooting to HOB Supine to Sit: 6: Modified independent (Device/Increase time) Scooting to [facility]: 6: Modified independent (Device/Increase time) Transfers Transfers: Sit to Stand Sit to Stand: 4: Min assist;From chair/3-in-1 Stand to Sit: 5: Supervision Details for Transfer Assistance: mis assist using  PFRW Ambulation/Gait Ambulation/Gait Assistance: 4: Min guard Ambulation Distance (Feet): 23 Feet Assistive device: Left platform walker Ambulation/Gait Assistance Details: required multiple cues to slow down for improved safety, cues to stay inside RW, pt was compliant with R LE NWB, but with a lot of effort. Gait Pattern: Step-to pattern Gait velocity: too fast for safety General Gait Details: pt was previously WBAT  on RLE. Now pt is R NWB requiring the use of a PFRW. Stairs: No    Exercises     PT Diagnosis:    PT Problem List:   PT Treatment Interventions:     PT Goals Acute Rehab PT Goals PT Goal: Sit to Supine/Side - Progress: Met PT Goal: Sit to Stand - Progress: Not met (continue goal due to adding RLE NWB) PT Transfer Goal: Bed to Chair/Chair to Bed - Progress: Not met (continue goal due to adding R LE NWB) PT Goal: Ambulate - Progress: Progressing toward goal  Visit Information  Last PT Received On: 06/26/12 Assistance Needed: +1    Subjective Data  Subjective: This type of walker requires a lot of effort to use.   Cognition  Cognition Overall Cognitive Status: Appears within functional limits for tasks assessed/performed Arousal/Alertness: Awake/alert Orientation Level: Appears intact for tasks assessed Behavior During Session: [facility] for tasks performed    Balance     End of Session PT - End of Session Equipment Utilized During Treatment: Gait belt Activity Tolerance: Patient tolerated treatment well Patient left: in bed;with family/visitor present;with call bell/phone within reach Nurse Communication: Mobility status   GP     Greggory Stallion 06/26/2012, 12:18 PM

## 2012-06-26 NOTE — Progress Notes (Signed)
Dr. Magnus Ivan to evaluate R MT FXs.  R foot more swollen and ecchymotic today.  Otherwise improving. Patient examined and I agree with the assessment and plan  Violeta Gelinas, MD, MPH, FACS Pager: [phone]  06/26/2012 2:07 PM

## 2012-06-26 NOTE — Progress Notes (Signed)
Orthopedic Tech Progress Note Patient Details:  Christopher Henderson 1945/05/21 [id number]  Ortho Devices Type of Ortho Device: CAM walker Ortho Device/Splint Location: RIGHT CAM WALKER Ortho Device/Splint Interventions: Application   Cammer, Mickie Bail 06/26/2012, 4:11 PM

## 2012-06-27 ENCOUNTER — Inpatient Hospital Stay (HOSPITAL_COMMUNITY): Payer: Medicare Other

## 2012-06-27 LAB — BASIC METABOLIC PANEL
BUN: 15 mg/dL (ref 6–23)
CO2: 26 meq/L (ref 19–32)
Calcium: 8 mg/dL — ABNORMAL LOW (ref 8.4–10.5)
Chloride: 101 meq/L (ref 96–112)
Creatinine, Ser: 0.79 mg/dL (ref 0.50–1.35)
GFR calc Af Amer: >90 mL/min (ref >90)
GFR calc non Af Amer: >90 mL/min (ref >90)
Glucose, Bld: 127 mg/dL — ABNORMAL HIGH (ref 70–99)
Potassium: 3.1 meq/L — ABNORMAL LOW (ref 3.5–5.1)
Sodium: 136 meq/L (ref 135–145)

## 2012-06-27 LAB — CBC
HCT: 33.1 % — ABNORMAL LOW (ref 39.0–52.0)
Hemoglobin: 11.4 g/dL — ABNORMAL LOW (ref 13.0–17.0)
MCH: 29.8 pg (ref 26.0–34.0)
MCHC: 34.4 g/dL (ref 30.0–36.0)
RDW: 12.5 % (ref 11.5–15.5)

## 2012-06-27 MED ORDER — HYDROCODONE-ACETAMINOPHEN 5-325 MG PO TABS: 1.0000 | ORAL_TABLET | ORAL | Status: DC | PRN

## 2012-06-27 NOTE — Care Management Note (Signed)
  Page 1 of 1   06/27/2012     3:17:41 PM   CARE MANAGEMENT NOTE 06/27/2012  Patient:  Christopher Henderson,Christopher Henderson   Account Number:  [id number]  Date Initiated:  06/27/2012  Documentation initiated by:  Ronny Flurry  Subjective/Objective Assessment:     Action/Plan:   Anticipated DC Date:  06/27/2012   Anticipated DC Plan:  HOME W HOME HEALTH SERVICES         Choice offered to / List presented to:  C-1 Patient        HH arranged  HH-2 PT  HH-3 OT      [facility] agency  [facility]   Status of service:  Completed, signed off Medicare Important Message given?   (If response is "NO", the following Medicare IM given date fields will be blank) Date Medicare IM given:   Date Additional Medicare IM given:    Discharge Disposition:    Per UR Regulation:  Reviewed for med. necessity/level of care/duration of stay  If discussed at Long Length of Stay Meetings, dates discussed:    Comments:

## 2012-06-27 NOTE — Progress Notes (Signed)
Doing well.  Okay to go home.  This patient has been seen and I agree with the findings and treatment plan.  Clay Menser O. Mishell Donalson, III, MD, FACS [phone] (pager) [phone] (direct pager) Trauma Surgeon 

## 2012-06-27 NOTE — Progress Notes (Signed)
ANTIBIOTIC CONSULT NOTE - FOLLOW UP  Pharmacy Consult:  Cefepime Indication:  Rule out PNA  Allergies  Allergen Reactions  . Penicillins Hives    Patient Measurements: Height: 6' (182.9 cm) Weight: 227 lb 4.7 oz (103.1 kg) IBW/kg (Calculated) : 77.6  Vital Signs: Temp: 98.4 F (36.9 C) (04/02 0536) Temp src: Oral (04/02 0536) BP: 146/71 mmHg (04/02 0536) Pulse Rate: 84 (04/02 0536) Intake/Output from previous day: 04/01 0701 - 04/02 0700 In: 1180 [P.O.:980; IV Piggyback:200] Out: 1050 [Urine:1050]  Labs:  Recent Labs  06/26/12 0612 06/27/12 0525  WBC 10.8* 10.7*  HGB 11.3* 11.4*  PLT 281 281  CREATININE  --  0.79   Estimated Creatinine Clearance: 114.3 ml/min (by C-G formula based on Cr of 0.79). No results found for this basename: VANCOTROUGH, Leodis Binet, VANCORANDOM, GENTTROUGH, GENTPEAK, GENTRANDOM, TOBRATROUGH, TOBRAPEAK, TOBRARND, AMIKACINPEAK, AMIKACINTROU, AMIKACIN,  in the last 72 hours   Microbiology: Recent Results (from the past 720 hour(s))  MRSA PCR SCREENING     Status: Abnormal   Collection Time    06/18/12 10:38 PM      Result Value Range Status   MRSA by PCR POSITIVE (*) NEGATIVE Final   Comment:            The GeneXpert MRSA Assay (FDA     approved for NASAL specimens     only), is one component of a     comprehensive MRSA colonization     surveillance program. It is not     intended to diagnose MRSA     infection nor to guide or     monitor treatment for     MRSA infections.     RESULT CALLED TO, READ BACK BY AND VERIFIED WITH:     CALLED TO RN JENNIFER PARRISH [id number] @0001  THANEY  CULTURE, RESPIRATORY (NON-EXPECTORATED)     Status: None   Collection Time    06/24/12  5:10 PM      Result Value Range Status   Specimen Description TRACHEAL ASPIRATE   Final   Special Requests NONE   Final   Gram Stain     Final   Value: MODERATE WBC PRESENT, PREDOMINANTLY PMN     NO SQUAMOUS EPITHELIAL CELLS SEEN     RARE GRAM POSITIVE COCCI IN PAIRS    Culture Non-Pathogenic Oropharyngeal-type Flora Isolated.   Final   Report Status 06/26/2012 FINAL   Final        Assessment: 4 YOM admitted 06/18/12 after falling from a tree and started on Cefepime for rule out PNA.  Respiratory culture grew normal flora microorganisms and PA plans to complete a 5-day course of antibiotics.  Patient's renal function has been stable.  Maxipime 3/30>> Clinda post-op 3/25>>3/26 Vanc peri-op 3/24>3/25  3/30 resp cx - normal flora 3/24 MRSA screen positive    Goal of Therapy:  Clearance of infection / infection prevention   Plan:  - Cefepime 2gm IV Q12H through tomorrow per plan - Monitor renal fxn and adjust dose as appropriate - F/U K+ supplementation     Thanvi Blincoe D. Laney Potash, PharmD, BCPS Pager:  [phone] 06/27/2012, 8:42 AM

## 2012-06-27 NOTE — Progress Notes (Signed)
OT Note  OT removed dressing and pt with noted drainage at volar aspect of MCP injury site. Pt with less drainage today compared to 06/26/12 dressing change. Pt with dressing placed over styloid process of the ulna and blister site. Both sites are healing nicely. The all pin sites are clean and not signs of infection. No redness or edema noted. The long finger pin appears slightly extended, however the edema of the hand has decr with patient demonstrating edema management taught in previous session. Pt with new xeoform applied over pin sites, volar aspects of the MCP wound site and new gauze applied (5x5) and gauze wrapping entire forearm.   Patient and wife educated to call MD [facility] if pain or odor occurs after d/c home. Pt and wife also educated to NOT REMOVE splint for any reason upon d/c home. Pt is to follow up with MD [facility] for dressing change and splint management.  Pt and wife agreeable to all of the above information   Lucile Shutters   OTR/L Pager: [phone] Office: [phone] .

## 2012-06-27 NOTE — Progress Notes (Signed)
Occupational Therapy Treatment Patient Details Name: Christopher Henderson MRN: [id number] DOB: 1945-06-05 Today's Date: 06/27/2012 Time: 6962-9528 OT Time Calculation (min): 38 min  OT Assessment / Plan / Recommendation Comments on Treatment Session Pt demonstrates impulsive unsafe use of the RW and plateform. Pt ambulating in the room without DME. Pt and wife educated on why this is not allowed and patient is NWB on the RT foot. Pt insist on d/cing today and going out and about tomorrow. Pt and wife educated on mobility limiting ability to complete normal weekly routines at this time. After toilet simulation task in the room , pt states I can see how I might need to spend sometime at home. Pt will require more DME than previously ordered due to new NWB RT LE    Follow Up Recommendations  Home health OT    Barriers to Discharge       Equipment Recommendations  3 in 1 bedside comode;Wheelchair (measurements OT);Other (comment) (RW with plateform)    Recommendations for Other Services    Frequency     Plan Discharge plan remains appropriate    Precautions / Restrictions Precautions Precautions: Fall Restrictions Weight Bearing Restrictions: Yes LUE Weight Bearing: Non weight bearing RLE Weight Bearing: Non weight bearing   Pertinent Vitals/Pain No pain reported at this time Edema and bruising noted on Rt LE when Cam boot removed- bandage placed over shin wound to prevent Cam boot from rubbing site    ADL  ADL Comments: Pt observed exiting the bathroom walking with on the CAM boot don and wife states " you can take this thing away he isn't going to use it" Wife Burna Mortimer was referring to the RW with plateform present in the room. Pt and wife both state the doctor this morning said I can put wait on the heal so I dont need no walker. Pt and wife educated that the PA stated that the patient is allowed to place the heal on the ground for stability when balancing for a restbreak or adls such as  standing at the sink to brush teeth. Pt and wife educated that the Rt LE is NWB and the RW with plateform is required for all mobility. Wife Burna Mortimer states "you mean he has to do all that hopping he will be worn out." Pt again asking about knee walker, crutches using only a RW without plateform. Pt and wife educated why the knee walker is not appropriate, why the RW is not appropriate and why crutches are not appropriate. Pt and wife don / doff Cam boot with min v/c for sequecne and correct application. Pt and wife could benefit from repeating don /doff of cam boot. Educaed on elevation of foot above the knee and the knee above the hip for proper elevation. Pt with edema present in the foot and appears more than 06/26/12. Pt educated again the Lt UE must elevated at all times. Pt attempted ~10 ft ambulation total with min (A). Pt is impulsive and moving rapidly with RW plateform. Pt needs (A) for sit<>Stand and requires pushing up with Rt UE. Pt is unable to decr to chairs or toilet safely. Pt will requrie 3n1. Pt 's wife Burna Mortimer currently this session was not comfortable transfering patient. Wife requesting the patient walk with heel on the ground. Wife again educated that pt is now weight beaing and he must hop and can only place the foot for resting. pt and wife educated on equipment recommendations that are to be updated  OT Diagnosis:    OT Problem List:   OT Treatment Interventions:     OT Goals Acute Rehab OT Goals OT Goal Formulation: With patient/family Time For Goal Achievement: 07/09/12 Potential to Achieve Goals: Good ADL Goals Pt Will Perform Lower Body Bathing: with modified independence;Sit to stand from chair ADL Goal: Lower Body Bathing - Progress: Goal set today Pt Will Perform Lower Body Dressing: with modified independence;Sit to stand from chair ADL Goal: Lower Body Dressing - Progress: Goal set today Pt Will Transfer to Toilet: with modified independence;Ambulation;3-in-1 ADL Goal:  Toilet Transfer - Progress: Goal set today Pt Will Perform Toileting - Clothing Manipulation: with modified independence;Sitting on 3-in-1 or toilet ADL Goal: Toileting - Clothing Manipulation - Progress: Goal set today Pt Will Perform Toileting - Hygiene: with modified independence;Sit to stand from 3-in-1/toilet ADL Goal: Toileting - Hygiene - Progress: Goal set today Miscellaneous OT Goals Miscellaneous OT Goal #1: Pt will tolerate splint without skin break down or pressure points for entire day OT Goal: Miscellaneous Goal #1 - Progress: Met Miscellaneous OT Goal #2: pt will maintain MCP neutral position with splint don throughout the day OT Goal: Miscellaneous Goal #2 - Progress: Met  Visit Information  Last OT Received On: 06/27/12 Assistance Needed: +1    Subjective Data      Prior Functioning       Cognition  Cognition Overall Cognitive Status: Appears within functional limits for tasks assessed/performed Arousal/Alertness: Awake/alert Orientation Level: Appears intact for tasks assessed Behavior During Session: [facility] for tasks performed Cognition - Other Comments: impulsive- needs repetition to all education.     Mobility  Bed Mobility Bed Mobility: Not assessed Transfers Sit to Stand: 4: Min assist Stand to Sit: 4: Min assist Details for Transfer Assistance: uncontrolled descend, needs Rt UE to complete transfers. without Rt UE use pt is unable to stand    Exercises      Balance Static Standing Balance Static Standing - Balance Support: Right upper extremity supported;During functional activity Static Standing - Level of Assistance: 4: Min assist   End of Session OT - End of Session Activity Tolerance: Patient limited by fatigue Patient left: in chair;with call bell/phone within reach;with family/visitor present Nurse Communication: Precautions;Mobility status  GO     Harrel Carina [facility] 06/27/2012, 2:26 PM Pager: [phone]

## 2012-06-27 NOTE — Progress Notes (Signed)
Patient discharged to home with instructions, walker and commode sent with patient.

## 2012-06-27 NOTE — Progress Notes (Signed)
Patient ID: Christopher Henderson, male   DOB: [birth year], 67 y.o.   MRN: [id number]   LOS: 9 days   Subjective: No c/o. Ready to go home.   Objective: Vital signs in last 24 hours: Temp:  [98.1 F (36.7 C)-98.6 F (37 C)] 98.4 F (36.9 C) (04/02 0536) Pulse Rate:  [77-96] 84 (04/02 0536) Resp:  [18-22] 18 (04/02 0536) BP: (125-146)/(60-84) 146/71 mmHg (04/02 0536) SpO2:  [92 %-94 %] 92 % (04/02 0536) Last BM Date: 06/26/12   Laboratory  CBC  Recent Labs  06/26/12 0612 06/27/12 0525  WBC 10.8* 10.7*  HGB 11.3* 11.4*  HCT 32.7* 33.1*  PLT 281 281   BMET  Recent Labs  06/27/12 0525  NA 136  K 3.1*  CL 101  CO2 26  GLUCOSE 127*  BUN 15  CREATININE 0.79  CALCIUM 8.0*    Radiology Results PORTABLE CHEST - 1 VIEW  Comparison: Portable chest x-ray of 06/26/2012  Findings: The left chest tube has been removed and no definite left  pneumothorax is seen. There is mild left basilar atelectasis  present. Mild volume loss at the right lung base is noted.  Multiple left rib fractures again noted.  IMPRESSION:  Left chest tube removed. No definite pneumothorax.  Original Report Authenticated By: Dwyane Dee, M.D.   Physical Exam General appearance: alert and no distress Resp: clear to auscultation bilaterally Cardio: regular rate and rhythm GI: normal findings: bowel sounds normal and soft, non-tender Extremities: NVI   Assessment/Plan: Fall  Multiple left rib fxs w/HTPX s/p CT  Open left 5th proximal phalanx/3rd MC fxs s/p ORIF -- NWB  Left scapula fx  Right MT fxs -- Recommendations noted Grade 2 splenic laceration  ABL anemia -- Mild, stable ID -- Maxipime D#4 empiric for PNA, culture non-pathogenic oropharyngeal flora, WBC stable, afebrile. Will continue abx through day 5 then d/c.  HTN -- Home meds  FEN -- No issues VTE -- SCD's, Lovenox  Dispo -- Home this afternoon after PT/OT work with new RLE restrictions    Freeman Caldron, PA-C Pager:  [phone] General Trauma PA Pager: [phone]   06/27/2012

## 2012-06-27 NOTE — Progress Notes (Signed)
Physical Therapy Treatment Patient Details Name: Christopher Henderson MRN: [id number] DOB: 1946-01-24 Today's Date: 06/27/2012 Time: 4098-1191 PT Time Calculation (min): 25 min  PT Assessment / Plan / Recommendation Comments on Treatment Session  Pt remains somewhat impulsive but it able to ambulate with close supervision with platform RW, discussed entry into home with he and a friend who can assist him and they vocalized understanding. Recommend HHPT, left platform RW, and BSC. PT will continue to follow.    Follow Up Recommendations  Home health PT     Does the patient have the potential to tolerate intense rehabilitation     Barriers to Discharge        Equipment Recommendations  Rolling walker with 5" wheels;Other (comment) (RW with left platform, BSC)    Recommendations for Other Services    Frequency Min 3X/week   Plan Discharge plan remains appropriate;Frequency remains appropriate    Precautions / Restrictions Precautions Precautions: Fall Precaution Comments: a little impulsive Required Braces or Orthoses: Other Brace/Splint Other Brace/Splint: CAM boot RLE Restrictions Weight Bearing Restrictions: Yes LUE Weight Bearing: Non weight bearing RLE Weight Bearing: Non weight bearing Other Position/Activity Restrictions: can WB through left elbow and given OK to put minimal wt through right heel with CAM boot'   Pertinent Vitals/Pain VSS    Mobility  Bed Mobility Bed Mobility: Not assessed Transfers Transfers: Sit to Stand;Stand to Sit Sit to Stand: 4: Min guard;From chair/3-in-1 Stand to Sit: 4: Min guard;To chair/3-in-1 Details for Transfer Assistance: vc's for safety Ambulation/Gait Ambulation/Gait Assistance: 4: Min guard Ambulation Distance (Feet): 25 Feet Assistive device: Left platform walker Ambulation/Gait Assistance Details: pt much safer when touching heel of CAM boot to floor with ambulation but still tends to be impulsive, esp with turning. Re-emphasized  safety precautions. Gait Pattern: Step-to pattern Stairs: Yes Stairs Assistance: Other (comment) (educated pt and friends that will help him on stairs to [employer]) Corporate treasurer: No    Exercises General Exercises - Lower Extremity Long Arc Quad: AROM;Both;Seated;5 reps Straight Leg Raises: AROM;Both;5 reps;Seated Hip Flexion/Marching: AROM;Both;5 reps;Seated;Other (comment) (without touching right to floor)   PT Diagnosis:    PT Problem List:   PT Treatment Interventions:     PT Goals Acute Rehab PT Goals PT Goal Formulation: With patient Time For Goal Achievement: 07/07/12 Potential to Achieve Goals: Good Pt will go Supine/Side to Sit: with supervision PT Goal: Supine/Side to Sit - Progress: Met Pt will go Sit to Supine/Side: with supervision PT Goal: Sit to Supine/Side - Progress: Met Pt will go Sit to Stand: with supervision PT Goal: Sit to Stand - Progress: Progressing toward goal Pt will go Stand to Sit: with supervision PT Goal: Stand to Sit - Progress: Progressing toward goal Pt will Transfer Bed to Chair/Chair to Bed: with supervision PT Transfer Goal: Bed to Chair/Chair to Bed - Progress: Progressing toward goal Pt will Ambulate: >150 feet;with supervision;with least restrictive assistive device PT Goal: Ambulate - Progress: Progressing toward goal Pt will Go Up / Down Stairs: 3-5 stairs;with supervision;with rail(s) PT Goal: Up/Down Stairs - Progress: Progressing toward goal  Visit Information  Last PT Received On: 06/27/12 Assistance Needed: +1    Subjective Data  Subjective: I'm not used to just sitting around Patient Stated Goal: to go home with his wife   Cognition  Cognition Overall Cognitive Status: Appears within functional limits for tasks assessed/performed Arousal/Alertness: Awake/alert Orientation Level: Appears intact for tasks assessed Behavior During Session: [facility] for tasks performed Cognition - Other Comments:  impulsive-  needs repetition to all education.     Balance  Balance Balance Assessed: Yes Static Standing Balance Static Standing - Balance Support: No upper extremity supported;During functional activity Static Standing - Level of Assistance: 5: Stand by assistance Static Standing - Comment/# of Minutes: pt stood to pull up pants and was able to maintain balance without UE support  End of Session PT - End of Session Activity Tolerance: Patient tolerated treatment well Patient left: in chair;with call bell/phone within reach;with family/visitor present;with nursing in room Nurse Communication: Mobility status;Other (comment) (equip needs)   GP   Lyanne Co, PT  Acute Rehab Services  [phone]   Lyanne Co 06/27/2012, 3:10 PM

## 2012-06-27 NOTE — Discharge Summary (Signed)
Physician Discharge Summary  Patient ID: Christopher Henderson MRN: [id number] DOB/AGE: 12/23/1945 67 y.o.  Admit date: 06/18/2012 Discharge date: 06/27/2012  Discharge Diagnoses Patient Active Problem List   Diagnosis Date Noted  . Fracture of metatarsals of right foot, closed 06/26/2012  . Left scapula fracture 06/26/2012  . Fall 06/19/2012  . Multiple fractures of ribs of left side 06/19/2012  . Traumatic hemopneumothorax 06/19/2012  . Respiratory failure, acute 06/19/2012  . Grade 2 spleen laceration 06/19/2012  . Acute blood loss anemia 06/19/2012  . HTN (hypertension) 06/19/2012  . Open fracture of phalanx of fifth finger of left hand 06/19/2012  . Open fracture of third metacarpal bone 06/19/2012  . Laceration of palm 06/19/2012    Consultants Dr. Dairl Ponder for hand surgery  Dr. Leonides Sake for vascular surgery  Dr. Allie Bossier for orthopedic surgery   Procedures Left tube thoracostomy by Megan Dort, PA-C  Incision and drainage above with open reduction and internal fixation of proximal phalangeal fracture, left small finger, and open reduction internal fixation, left long finger and metacarpal fracture by Dr. Mina Marble   HPI: Christopher Henderson presents as a level 1 trauma after he, while cutting a tree limb, lost balance and fell approximately 10 feet. The patient denied loss of consciousness. The patient is complaining of left sided chest pain, shortness of breath, and left hand pain. He sustained a laceration to his left hand and an abrasion to his right lower extremity. He was bought in by EMS on his right side, was visibly in moderate respiratory distress and tachycardic. Small flail segment noted over left anterior chest. His workup included CT scans of the head, cervical spine, chest, abdomen, and pelvis as well as extremity films of his left upper extremity. He had a chest tube placed for his left hemopneumothorax and was taken to the OR for fixation of his hand.   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 Orthopedic surgery was consulted for his scapula fracture and decided on non-operative treatment. The patient was maintained on the ventilator for several days while his settings and sedation were weaned. He was able to be extubated and did well from a respiratory standpoint from then on. His chest tube was able to be weaned and removed without difficulty. He was mobilized with physical and occupational therapies. After getting up he complained of pain in his right foot. X-rays of this demonstrated the metatarsal fractures and orthopedic surgery made further recommendations. His pain was controlled on oral medications and he was able to be discharged home in improved condition.      Medication List    TAKE these medications       amLODipine 10 MG tablet  Commonly known as:  NORVASC  Take 10 mg by mouth daily.     benazepril 40 MG tablet  Commonly known as:  LOTENSIN  Take 40 mg by mouth daily.     HYDROcodone-acetaminophen 5-325 MG per tablet  Commonly known as:  NORCO  Take 1-2 tablets by mouth every 4 (four) hours as needed for pain.             Follow-up Information   Schedule an appointment as soon as possible for a visit with Marlowe Shores, MD.   Contact information:   [address] [place] [address] [phone]       Follow up with Kathryne Hitch, MD  In 2 weeks.   Contact information:   [address] [place] [address] [phone]       Call [facility]. (As needed)    Contact information:   [address] [place] [address] [phone]       Signed: Freeman Caldron, PA-C Pager: [phone] General Trauma PA Pager: [phone]  06/27/2012, 2:57 PM

## 2012-06-28 ENCOUNTER — Other Ambulatory Visit (INDEPENDENT_AMBULATORY_CARE_PROVIDER_SITE_OTHER): Payer: Self-pay | Admitting: General Surgery

## 2012-06-28 DIAGNOSIS — J942 Hemothorax: Secondary | ICD-10-CM

## 2012-12-27 ENCOUNTER — Ambulatory Visit: Payer: Self-pay | Admitting: Unknown Physician Specialty

## 2013-02-18 ENCOUNTER — Ambulatory Visit: Payer: Self-pay | Admitting: Family Medicine

## 2014-10-09 ENCOUNTER — Encounter: Payer: Self-pay | Admitting: Family Medicine

## 2014-10-09 ENCOUNTER — Ambulatory Visit (INDEPENDENT_AMBULATORY_CARE_PROVIDER_SITE_OTHER): Payer: Commercial Managed Care - HMO | Admitting: Family Medicine

## 2014-10-09 VITALS — BP 122/82 | HR 71 | Temp 97.6°F | Ht 72.6 in | Wt 222.6 lb

## 2014-10-09 DIAGNOSIS — Z0000 Encounter for general adult medical examination without abnormal findings: Secondary | ICD-10-CM

## 2014-10-09 DIAGNOSIS — N138 Other obstructive and reflux uropathy: Secondary | ICD-10-CM | POA: Insufficient documentation

## 2014-10-09 DIAGNOSIS — N401 Enlarged prostate with lower urinary tract symptoms: Secondary | ICD-10-CM

## 2014-10-09 DIAGNOSIS — I10 Essential (primary) hypertension: Secondary | ICD-10-CM | POA: Diagnosis not present

## 2014-10-09 MED ORDER — BENAZEPRIL HCL 40 MG PO TABS: 40.0000 mg | ORAL_TABLET | Freq: Every day | ORAL | Status: DC

## 2014-10-09 MED ORDER — AMLODIPINE BESYLATE 10 MG PO TABS: 10.0000 mg | ORAL_TABLET | Freq: Every day | ORAL | Status: DC

## 2014-10-09 NOTE — Progress Notes (Signed)
BP 122/82 mmHg  Pulse 71  Temp(Src) 97.6 F (36.4 C)  Ht 6' 0.6" (1.844 m)  Wt 222 lb 9.6 oz (100.971 kg)  BMI 29.69 kg/m2  SpO2 95%   Subjective:    Patient ID: Christopher Henderson, male    DOB: 1945-04-12, 69 y.o.   MRN: [id number]  HPI: Christopher Henderson is a 69 y.o. male  Chief Complaint  Patient presents with  . Medicare Wellness  . Medication Refill    pt needs medications refilled  AWV all medicare requirements met. Such as depression screen, fall risk etc. Not formally entered as [facility] has not loaded the appropriate forms. Doing well with all medicine. Takes everyday with no side effects. Stable from last visit. Medical problems reviewed and stable Relevant past medical, surgical, family and social history reviewed and updated as indicated. Interim medical history since our last visit reviewed. Allergies and medications reviewed and updated.  Review of Systems  Constitutional: Negative.   HENT: Negative.   Eyes: Negative.   Respiratory: Negative.   Cardiovascular: Negative.   Endocrine: Negative.   Musculoskeletal: Negative.   Skin: Negative.   Allergic/Immunologic: Negative.   Neurological: Negative.   Hematological: Negative.   Psychiatric/Behavioral: Negative.     Per HPI unless specifically indicated above     Objective:    BP 122/82 mmHg  Pulse 71  Temp(Src) 97.6 F (36.4 C)  Ht 6' 0.6" (1.844 m)  Wt 222 lb 9.6 oz (100.971 kg)  BMI 29.69 kg/m2  SpO2 95%  Wt Readings from Last 3 Encounters:  10/09/14 222 lb 9.6 oz (100.971 kg)  03/31/14 221 lb (100.245 kg)  06/25/12 227 lb 4.7 oz (103.1 kg)    Physical Exam  Constitutional: He is oriented to person, place, and time. He appears well-developed and well-nourished. No distress.  HENT:  Head: Normocephalic and atraumatic.  Right Ear: Hearing normal.  Left Ear: Hearing normal.  Nose: Nose normal.  Eyes: Conjunctivae and lids are normal. Right eye exhibits no discharge. Left eye exhibits no  discharge. No scleral icterus.  Pulmonary/Chest: Effort normal. No respiratory distress.  Genitourinary:  Done at urology  Musculoskeletal: Normal range of motion.  Neurological: He is alert and oriented to person, place, and time.  Skin: Skin is intact. No rash noted.  Psychiatric: He has a normal mood and affect. His speech is normal and behavior is normal. Judgment and thought content normal. Cognition and memory are normal.        Assessment & Plan:   Problem List Items Addressed This Visit      Cardiovascular and Mediastinum   HTN (hypertension) - Primary (Chronic)    The current medical regimen is effective;  continue present plan and medications.       Relevant Medications   benazepril (LOTENSIN) 40 MG tablet   amLODipine (NORVASC) 10 MG tablet   Other Relevant Orders   CBC with Differential/Platelet   Comprehensive metabolic panel   TSH   Lipid panel     Genitourinary   BPH with obstruction/lower urinary tract symptoms    .The current medical regimen is effective;  continue present plan and medications. Followed by urology      Relevant Orders   CBC with Differential/Platelet   Comprehensive metabolic panel   TSH   Lipid panel    Other Visit Diagnoses    Encounter for Medicare annual wellness exam        Relevant Orders    CBC with Differential/Platelet  Comprehensive metabolic panel    TSH    Lipid panel       Discussed lifestyle Diet exercise AWV issues all discussed  Follow up plan: Return in about 6 months (around 04/11/2015), or if symptoms worsen or fail to improve, for BP check and BMP.

## 2014-10-09 NOTE — Assessment & Plan Note (Signed)
.  The current medical regimen is effective;  continue present plan and medications. Followed by urology

## 2014-10-09 NOTE — Assessment & Plan Note (Signed)
The current medical regimen is effective;  continue present plan and medications.  

## 2014-10-10 LAB — CBC WITH DIFFERENTIAL/PLATELET
BASOS ABS: 0 10*3/uL (ref 0.0–0.2)
Basos: 0 %
EOS (ABSOLUTE): 0.3 10*3/uL (ref 0.0–0.4)
Eos: 6 %
Hematocrit: 43.8 % (ref 37.5–51.0)
Hemoglobin: 15.5 g/dL (ref 12.6–17.7)
IMMATURE GRANS (ABS): 0 10*3/uL (ref 0.0–0.1)
Immature Granulocytes: 0 %
LYMPHS ABS: 1.9 10*3/uL (ref 0.7–3.1)
LYMPHS: 37 %
MCH: 31.3 pg (ref 26.6–33.0)
MCHC: 35.4 g/dL (ref 31.5–35.7)
MCV: 88 fL (ref 79–97)
MONOCYTES: 6 %
MONOS ABS: 0.3 10*3/uL (ref 0.1–0.9)
NEUTROS ABS: 2.6 10*3/uL (ref 1.4–7.0)
NEUTROS PCT: 51 %
Platelets: 218 10*3/uL (ref 150–379)
RBC: 4.96 x10E6/uL (ref 4.14–5.80)
RDW: 13.3 % (ref 12.3–15.4)
WBC: 5.1 10*3/uL (ref 3.4–10.8)

## 2014-10-10 LAB — COMPREHENSIVE METABOLIC PANEL
A/G RATIO: 1.9 (ref 1.1–2.5)
ALK PHOS: 64 IU/L (ref 39–117)
ALT: 17 IU/L (ref 0–44)
AST: 22 IU/L (ref 0–40)
Albumin: 4.7 g/dL (ref 3.6–4.8)
BILIRUBIN TOTAL: 1 mg/dL (ref 0.0–1.2)
BUN / CREAT RATIO: 14 (ref 10–22)
BUN: 14 mg/dL (ref 8–27)
CHLORIDE: 100 mmol/L (ref 97–108)
CO2: 23 mmol/L (ref 18–29)
CREATININE: 0.98 mg/dL (ref 0.76–1.27)
Calcium: 9.7 mg/dL (ref 8.6–10.2)
GFR calc Af Amer: 91 mL/min/{1.73_m2} (ref >59)
GFR calc non Af Amer: 79 mL/min/{1.73_m2} (ref >59)
GLUCOSE: 114 mg/dL — AB (ref 65–99)
Globulin, Total: 2.5 g/dL (ref 1.5–4.5)
Potassium: 4.3 mmol/L (ref 3.5–5.2)
SODIUM: 140 mmol/L (ref 134–144)
TOTAL PROTEIN: 7.2 g/dL (ref 6.0–8.5)

## 2014-10-10 LAB — TSH: TSH: 2.25 u[IU]/mL (ref 0.450–4.500)

## 2014-10-10 LAB — LIPID PANEL
CHOLESTEROL TOTAL: 227 mg/dL — AB (ref 100–199)
Chol/HDL Ratio: 6.1 ratio units — ABNORMAL HIGH (ref 0.0–5.0)
HDL: 37 mg/dL — ABNORMAL LOW (ref >39)
LDL CALC: 160 mg/dL — AB (ref 0–99)
Triglycerides: 148 mg/dL (ref 0–149)
VLDL CHOLESTEROL CAL: 30 mg/dL (ref 5–40)

## 2014-10-14 ENCOUNTER — Telehealth: Payer: Self-pay | Admitting: Family Medicine

## 2014-10-14 NOTE — Telephone Encounter (Signed)
Pt needed to have appt approved to go to dr Nehemiah Massed august 1st. He says dr Jeananne Rama has to sign off on the appt and would like to receive a call back after this is taken care of or if there are any questions.

## 2014-10-14 NOTE — Telephone Encounter (Signed)
Please find out which Dr. Nehemiah Massed the patient needs a referral to see One is Dermatology and one is Cardiology

## 2014-10-15 NOTE — Progress Notes (Signed)
Phone call Discussed with patient elevated cholesterol need for better diet and exercise nutrition Check lipid panel next office visit

## 2014-11-13 IMAGING — CR DG CHEST 1V PORT
1 series · 1 of 1 positions shown · non-contrast
Comparison: 06/24/2012 and earlier.

CLINICAL DATA: 66-year-old male with left chest tube.  Left rib
fractures.

PORTABLE CHEST - 1 VIEW

[AP]
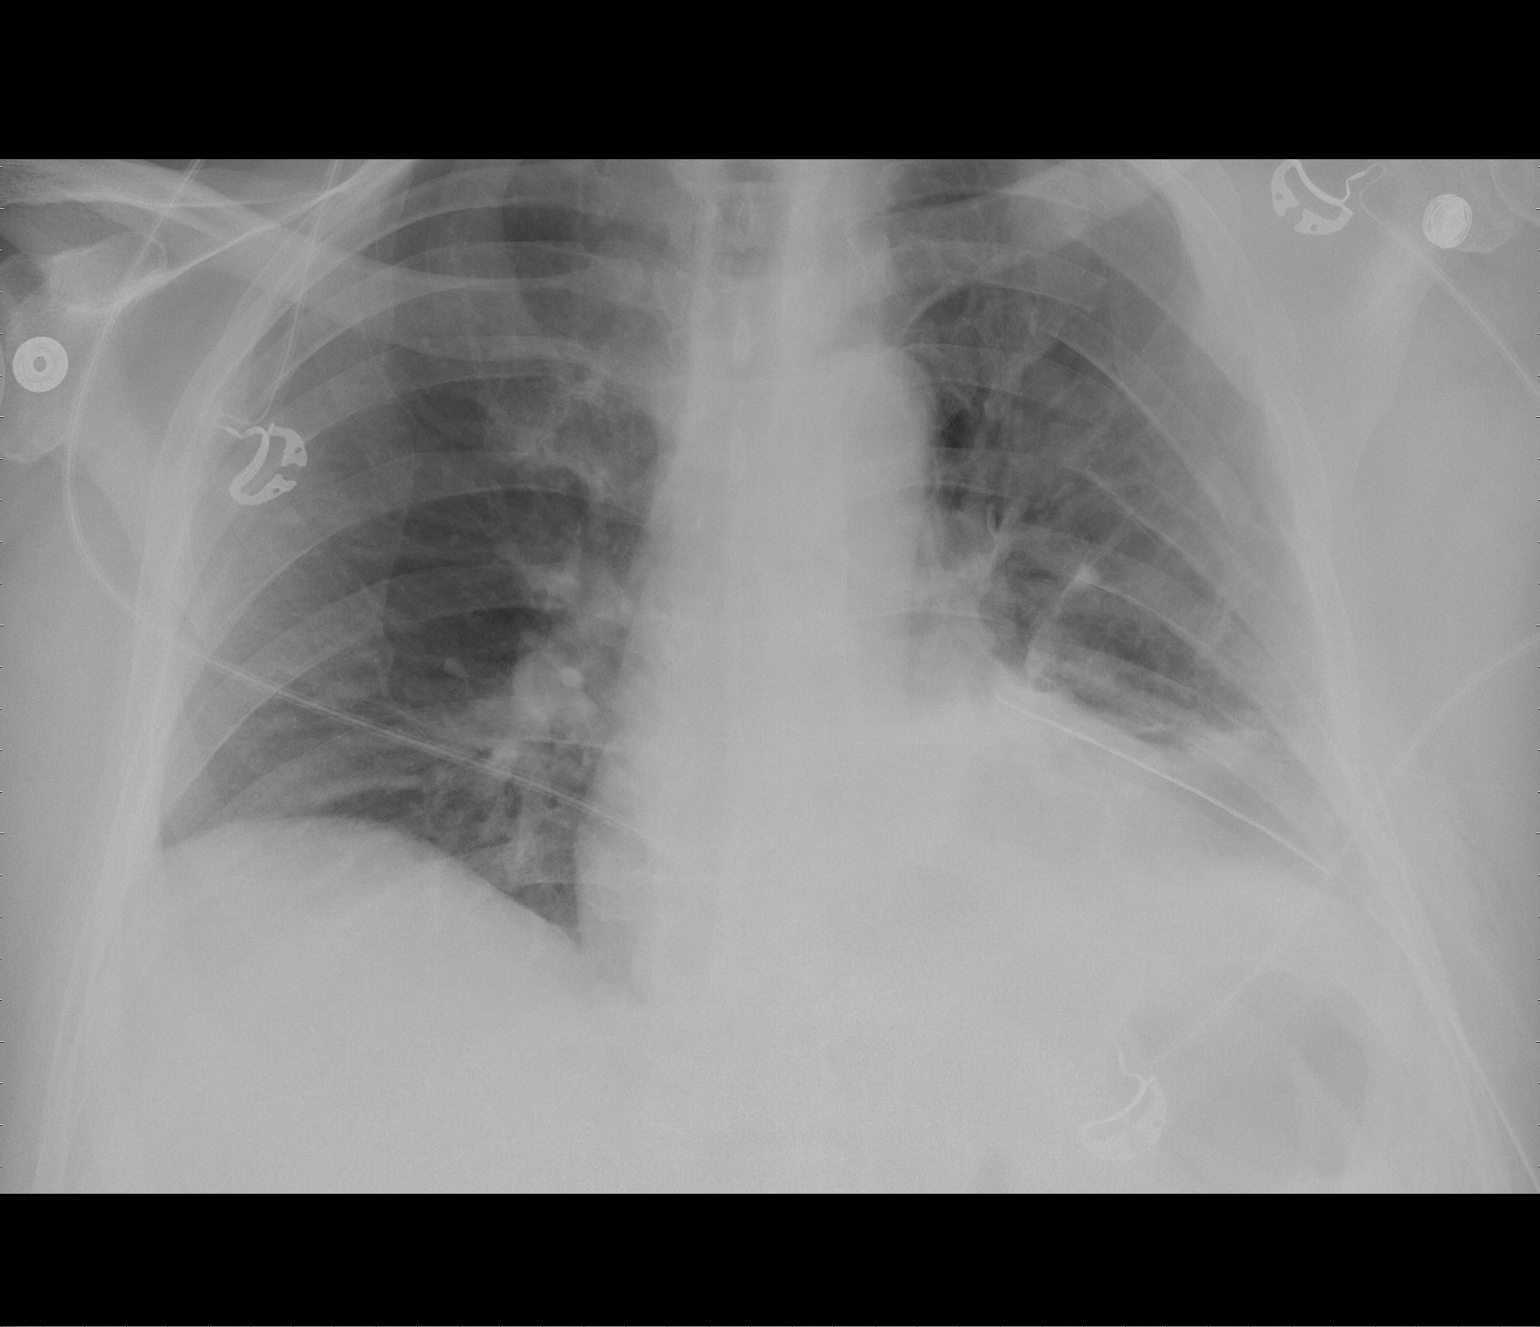

[1 of 1 positions shown; findings below may reference images not displayed]

FINDINGS: AP portable semi upright view 6299 hours.  Enteric tube
has been removed.  Left chest tube remains in place and appears
stable.  Small left pneumothorax now visible with apical pleural
edge.  Numerous left side rib fractures again noted.  Mildly
improved lung volumes and retrocardiac ventilation.  No pulmonary
edema or definite pleural effusion.  Stable cardiac size and
mediastinal contours.  Visualized tracheal air column is within
normal limits.
IMPRESSION: 1.  Small left apical pneumothorax now visible.  Stable left chest
tube.
2.  Mildly improved lung volumes and bibasilar ventilation.
3.  Enteric tube removed.

## 2014-11-14 IMAGING — CR DG CHEST 1V PORT
1 series · 1 of 1 positions shown · non-contrast
Comparison: Portable chest x-ray of 06/25/2012

CLINICAL DATA: Left pneumothorax after a fall, chest soreness

PORTABLE CHEST - 1 VIEW

[AP]
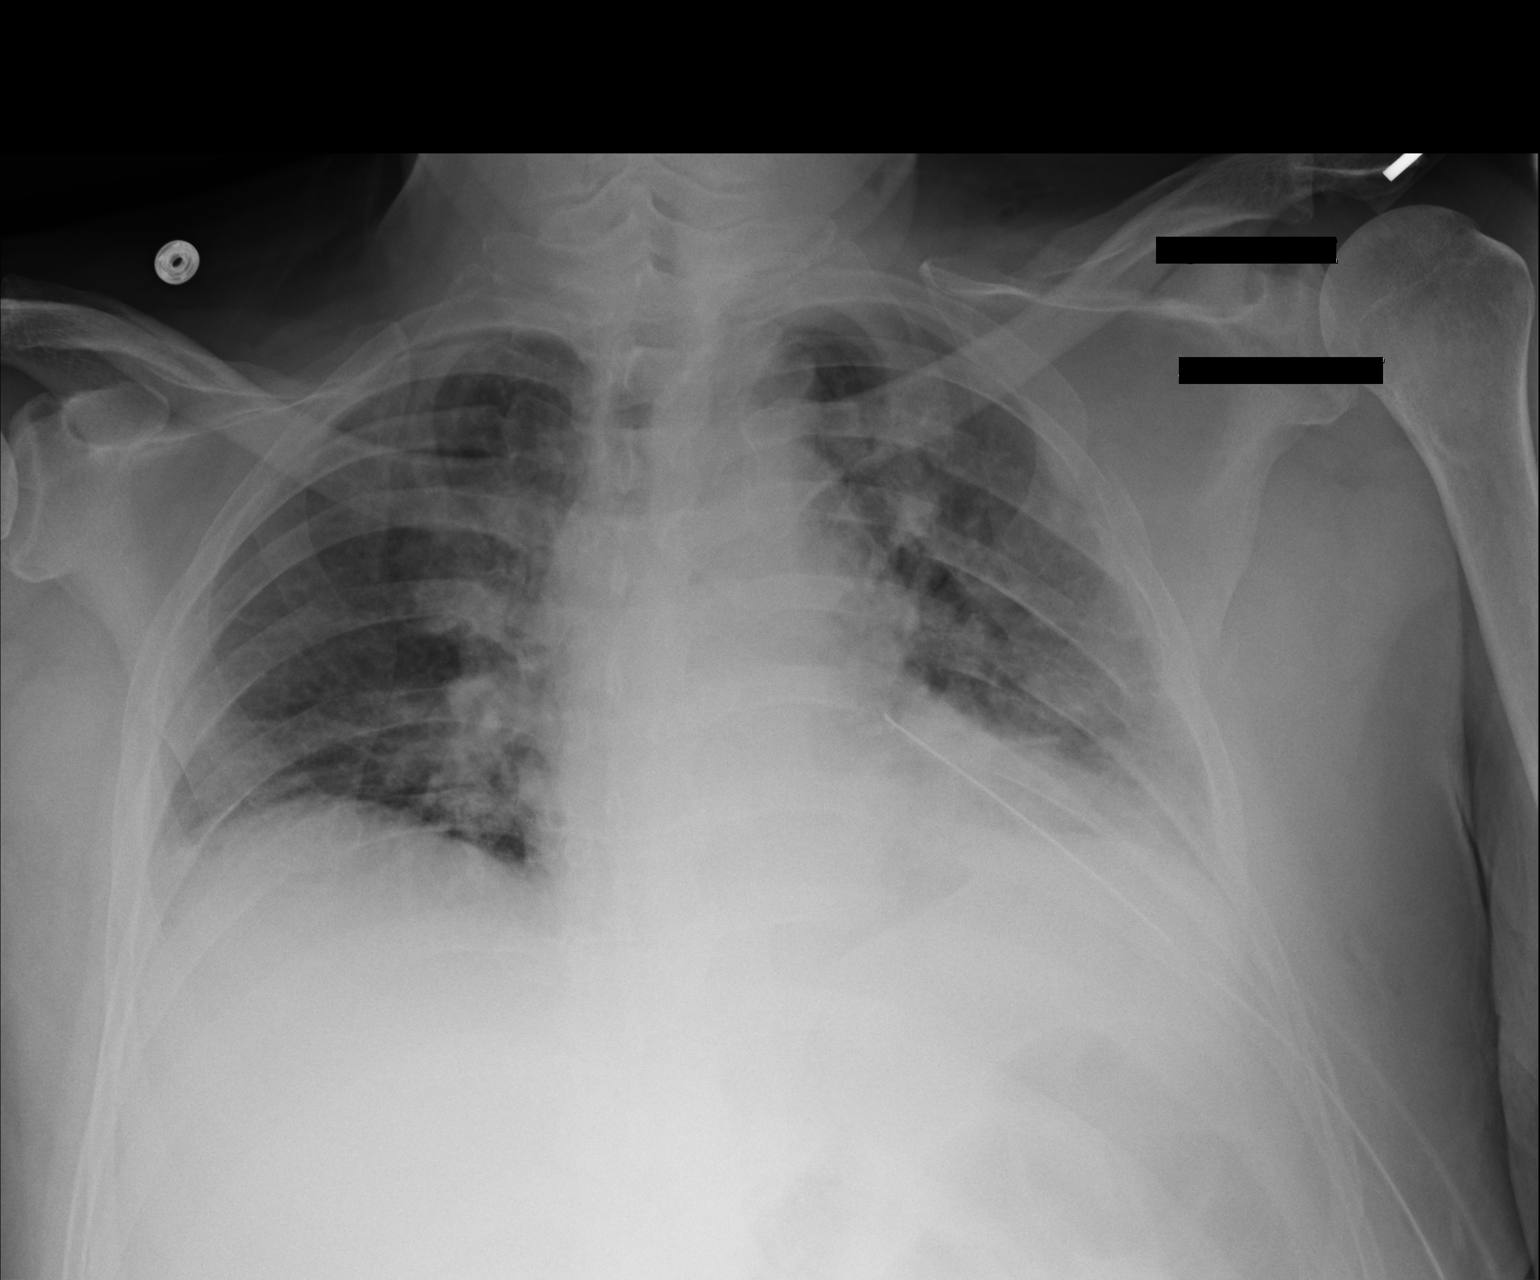

[1 of 1 positions shown; findings below may reference images not displayed]

FINDINGS: The previously noted left pneumothorax is not definitely
seen, and a left chest tube remains.  Multiple left rib fractures
are noted and the lungs are poorly aerated with basilar
atelectasis.  Heart size is stable.
IMPRESSION: No definite pneumothorax.  Basilar atelectasis left greater than
right.  Multiple left rib fractures.

## 2015-01-07 ENCOUNTER — Other Ambulatory Visit: Payer: Self-pay | Admitting: Family Medicine

## 2015-01-07 ENCOUNTER — Telehealth: Payer: Self-pay | Admitting: Emergency Medicine

## 2015-01-07 DIAGNOSIS — N400 Enlarged prostate without lower urinary tract symptoms: Secondary | ICD-10-CM

## 2015-01-07 NOTE — Telephone Encounter (Signed)
Pt came in stated he has an appt with Dr. Nash Mantis @ [facility] in November that he needs a referral for. Pt has [employer]. Please contact pt with any issues or questions. Thanks.

## 2015-01-27 ENCOUNTER — Ambulatory Visit: Payer: Medicare HMO

## 2015-02-13 ENCOUNTER — Encounter: Payer: Self-pay | Admitting: Family Medicine

## 2015-04-02 ENCOUNTER — Ambulatory Visit: Payer: Commercial Managed Care - HMO | Admitting: Family Medicine

## 2015-04-14 ENCOUNTER — Ambulatory Visit: Payer: Medicare HMO | Admitting: Family Medicine

## 2015-05-05 ENCOUNTER — Ambulatory Visit (INDEPENDENT_AMBULATORY_CARE_PROVIDER_SITE_OTHER): Payer: Medicare HMO | Admitting: Family Medicine

## 2015-05-05 ENCOUNTER — Encounter: Payer: Self-pay | Admitting: Family Medicine

## 2015-05-05 VITALS — BP 118/77 | HR 69 | Temp 97.6°F | Ht 71.6 in | Wt 214.0 lb

## 2015-05-05 DIAGNOSIS — N401 Enlarged prostate with lower urinary tract symptoms: Secondary | ICD-10-CM | POA: Diagnosis not present

## 2015-05-05 DIAGNOSIS — N138 Other obstructive and reflux uropathy: Secondary | ICD-10-CM

## 2015-05-05 DIAGNOSIS — I10 Essential (primary) hypertension: Secondary | ICD-10-CM | POA: Diagnosis not present

## 2015-05-05 NOTE — Assessment & Plan Note (Signed)
The current medical regimen is effective;  continue present plan and medications.  

## 2015-05-05 NOTE — Assessment & Plan Note (Signed)
Followed by urology.   

## 2015-05-05 NOTE — Progress Notes (Signed)
BP 118/77 mmHg  Pulse 69  Temp(Src) 97.6 F (36.4 C)  Ht 5' 11.6" (1.819 m)  Wt 214 lb (97.07 kg)  BMI 29.34 kg/m2  SpO2 99%   Subjective:    Patient ID: Christopher Henderson, male    DOB: [birth year], 70 y.o.   MRN: [id number]  HPI: Christopher Henderson is a 70 y.o. male  Chief Complaint  Patient presents with  . Hypertension    patient doing well blood pressure doing well with no complaints taking medications faithfully without problems  BPH stable followed by urology getting good reports PSA has been up and down but again monitored by urology Relevant past medical, surgical, family and social history reviewed and updated as indicated. Interim medical history since our last visit reviewed. Allergies and medications reviewed and updated.  Review of Systems  Constitutional: Negative.   Respiratory: Negative.   Cardiovascular: Negative.     Per HPI unless specifically indicated above     Objective:    BP 118/77 mmHg  Pulse 69  Temp(Src) 97.6 F (36.4 C)  Ht 5' 11.6" (1.819 m)  Wt 214 lb (97.07 kg)  BMI 29.34 kg/m2  SpO2 99%  Wt Readings from Last 3 Encounters:  05/05/15 214 lb (97.07 kg)  10/09/14 222 lb 9.6 oz (100.971 kg)  03/31/14 221 lb (100.245 kg)    Physical Exam  Constitutional: He is oriented to person, place, and time. He appears well-developed and well-nourished. No distress.  HENT:  Head: Normocephalic and atraumatic.  Right Ear: Hearing normal.  Left Ear: Hearing normal.  Nose: Nose normal.  Eyes: Conjunctivae and lids are normal. Right eye exhibits no discharge. Left eye exhibits no discharge. No scleral icterus.  Cardiovascular: Normal rate, regular rhythm and normal heart sounds.   Pulmonary/Chest: Effort normal and breath sounds normal. No respiratory distress.  Musculoskeletal: Normal range of motion.  Neurological: He is alert and oriented to person, place, and time.  Skin: Skin is intact. No rash noted.  Psychiatric: He has a normal mood and  affect. His speech is normal and behavior is normal. Judgment and thought content normal. Cognition and memory are normal.    Results for orders placed or performed in visit on 10/09/14  CBC with Differential/Platelet  Result Value Ref Range   WBC 5.1 3.4 - 10.8 x10E3/uL   RBC 4.96 4.14 - 5.80 x10E6/uL   Hemoglobin 15.5 12.6 - 17.7 g/dL   Hematocrit 43.8 37.5 - 51.0 %   MCV 88 79 - 97 fL   MCH 31.3 26.6 - 33.0 pg   MCHC 35.4 31.5 - 35.7 g/dL   RDW 13.3 12.3 - 15.4 %   Platelets 218 150 - 379 x10E3/uL   Neutrophils 51 %   Lymphs 37 %   Monocytes 6 %   Eos 6 %   Basos 0 %   Neutrophils Absolute 2.6 1.4 - 7.0 x10E3/uL   Lymphocytes Absolute 1.9 0.7 - 3.1 x10E3/uL   Monocytes Absolute 0.3 0.1 - 0.9 x10E3/uL   EOS (ABSOLUTE) 0.3 0.0 - 0.4 x10E3/uL   Basophils Absolute 0.0 0.0 - 0.2 x10E3/uL   Immature Granulocytes 0 %   Immature Grans (Abs) 0.0 0.0 - 0.1 x10E3/uL  Comprehensive metabolic panel  Result Value Ref Range   Glucose 114 (H) 65 - 99 mg/dL   BUN 14 8 - 27 mg/dL   Creatinine, Ser 0.98 0.76 - 1.27 mg/dL   GFR calc non Af Amer 79 >59 mL/min/1.73   GFR calc Af  Amer 91 >59 mL/min/1.73   BUN/Creatinine Ratio 14 10 - 22   Sodium 140 134 - 144 mmol/L   Potassium 4.3 3.5 - 5.2 mmol/L   Chloride 100 97 - 108 mmol/L   CO2 23 18 - 29 mmol/L   Calcium 9.7 8.6 - 10.2 mg/dL   Total Protein 7.2 6.0 - 8.5 g/dL   Albumin 4.7 3.6 - 4.8 g/dL   Globulin, Total 2.5 1.5 - 4.5 g/dL   Albumin/Globulin Ratio 1.9 1.1 - 2.5   Bilirubin Total 1.0 0.0 - 1.2 mg/dL   Alkaline Phosphatase 64 39 - 117 IU/L   AST 22 0 - 40 IU/L   ALT 17 0 - 44 IU/L  TSH  Result Value Ref Range   TSH 2.250 0.450 - 4.500 uIU/mL  Lipid panel  Result Value Ref Range   Cholesterol, Total 227 (H) 100 - 199 mg/dL   Triglycerides 148 0 - 149 mg/dL   HDL 37 (L) >39 mg/dL   VLDL Cholesterol Cal 30 5 - 40 mg/dL   LDL Calculated 160 (H) 0 - 99 mg/dL   Chol/HDL Ratio 6.1 (H) 0.0 - 5.0 ratio units      Assessment &  Plan:   Problem List Items Addressed This Visit      Cardiovascular and Mediastinum   Essential hypertension    The current medical regimen is effective;  continue present plan and medications.         Genitourinary   BPH with obstruction/lower urinary tract symptoms    Followed by urology       Other Visit Diagnoses    Essential hypertension, benign    -  Primary    Relevant Orders    Basic metabolic panel        Follow up plan: Return in about 6 months (around 11/02/2015) for Physical Exam.

## 2015-05-06 ENCOUNTER — Encounter: Payer: Self-pay | Admitting: Family Medicine

## 2015-05-06 LAB — BASIC METABOLIC PANEL
BUN / CREAT RATIO: 12 (ref 10–22)
BUN: 12 mg/dL (ref 8–27)
CHLORIDE: 101 mmol/L (ref 96–106)
CO2: 24 mmol/L (ref 18–29)
Calcium: 9.7 mg/dL (ref 8.6–10.2)
Creatinine, Ser: 1 mg/dL (ref 0.76–1.27)
GFR, EST AFRICAN AMERICAN: 89 mL/min/{1.73_m2} (ref >59)
GFR, EST NON AFRICAN AMERICAN: 77 mL/min/{1.73_m2} (ref >59)
Glucose: 109 mg/dL — ABNORMAL HIGH (ref 65–99)
POTASSIUM: 5.4 mmol/L — AB (ref 3.5–5.2)
Sodium: 142 mmol/L (ref 134–144)

## 2015-06-04 DIAGNOSIS — H40003 Preglaucoma, unspecified, bilateral: Secondary | ICD-10-CM | POA: Diagnosis not present

## 2015-06-04 DIAGNOSIS — H524 Presbyopia: Secondary | ICD-10-CM | POA: Diagnosis not present

## 2015-06-22 DIAGNOSIS — R69 Illness, unspecified: Secondary | ICD-10-CM | POA: Diagnosis not present

## 2015-07-06 ENCOUNTER — Telehealth: Payer: Self-pay | Admitting: Family Medicine

## 2015-07-06 DIAGNOSIS — I10 Essential (primary) hypertension: Secondary | ICD-10-CM

## 2015-07-06 MED ORDER — BENAZEPRIL HCL 40 MG PO TABS: 40.0000 mg | ORAL_TABLET | Freq: Every day | ORAL | Status: DC

## 2015-07-06 MED ORDER — AMLODIPINE BESYLATE 10 MG PO TABS: 10.0000 mg | ORAL_TABLET | Freq: Every day | ORAL | Status: DC

## 2015-07-06 NOTE — Telephone Encounter (Signed)
Pt came in stated he needs a refill on the following:  Benazepril Amlodipine  Pt needs 90 days supplies. Pharm is [facility]. Thanks.

## 2015-07-09 IMAGING — CR DG CHEST 2V
1 series · 2 of 2 positions shown · non-contrast
Comparison: None.

CLINICAL DATA: Cough, wheezing

EXAM:
CHEST  2 VIEW

[Series 1: pa · 0.17mm/px · 2 of 2 slices shown]
[im 1/2]
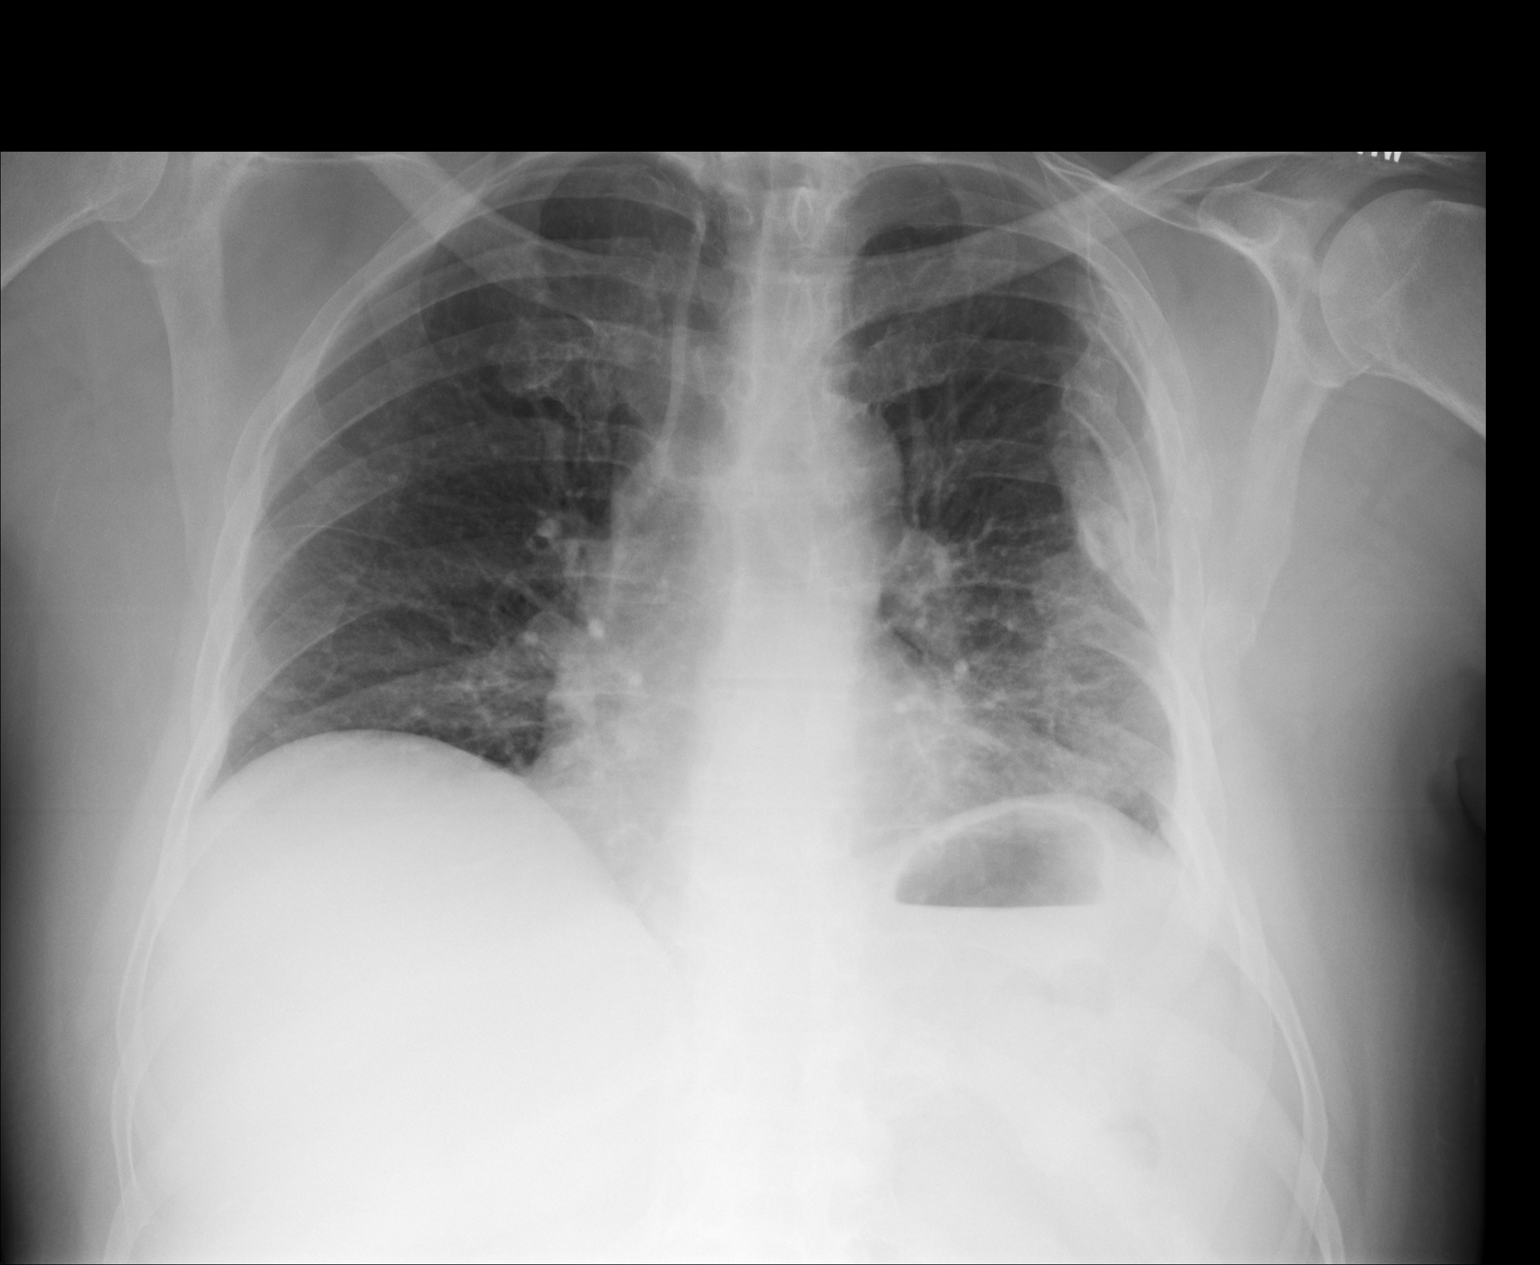
[im 2/2]
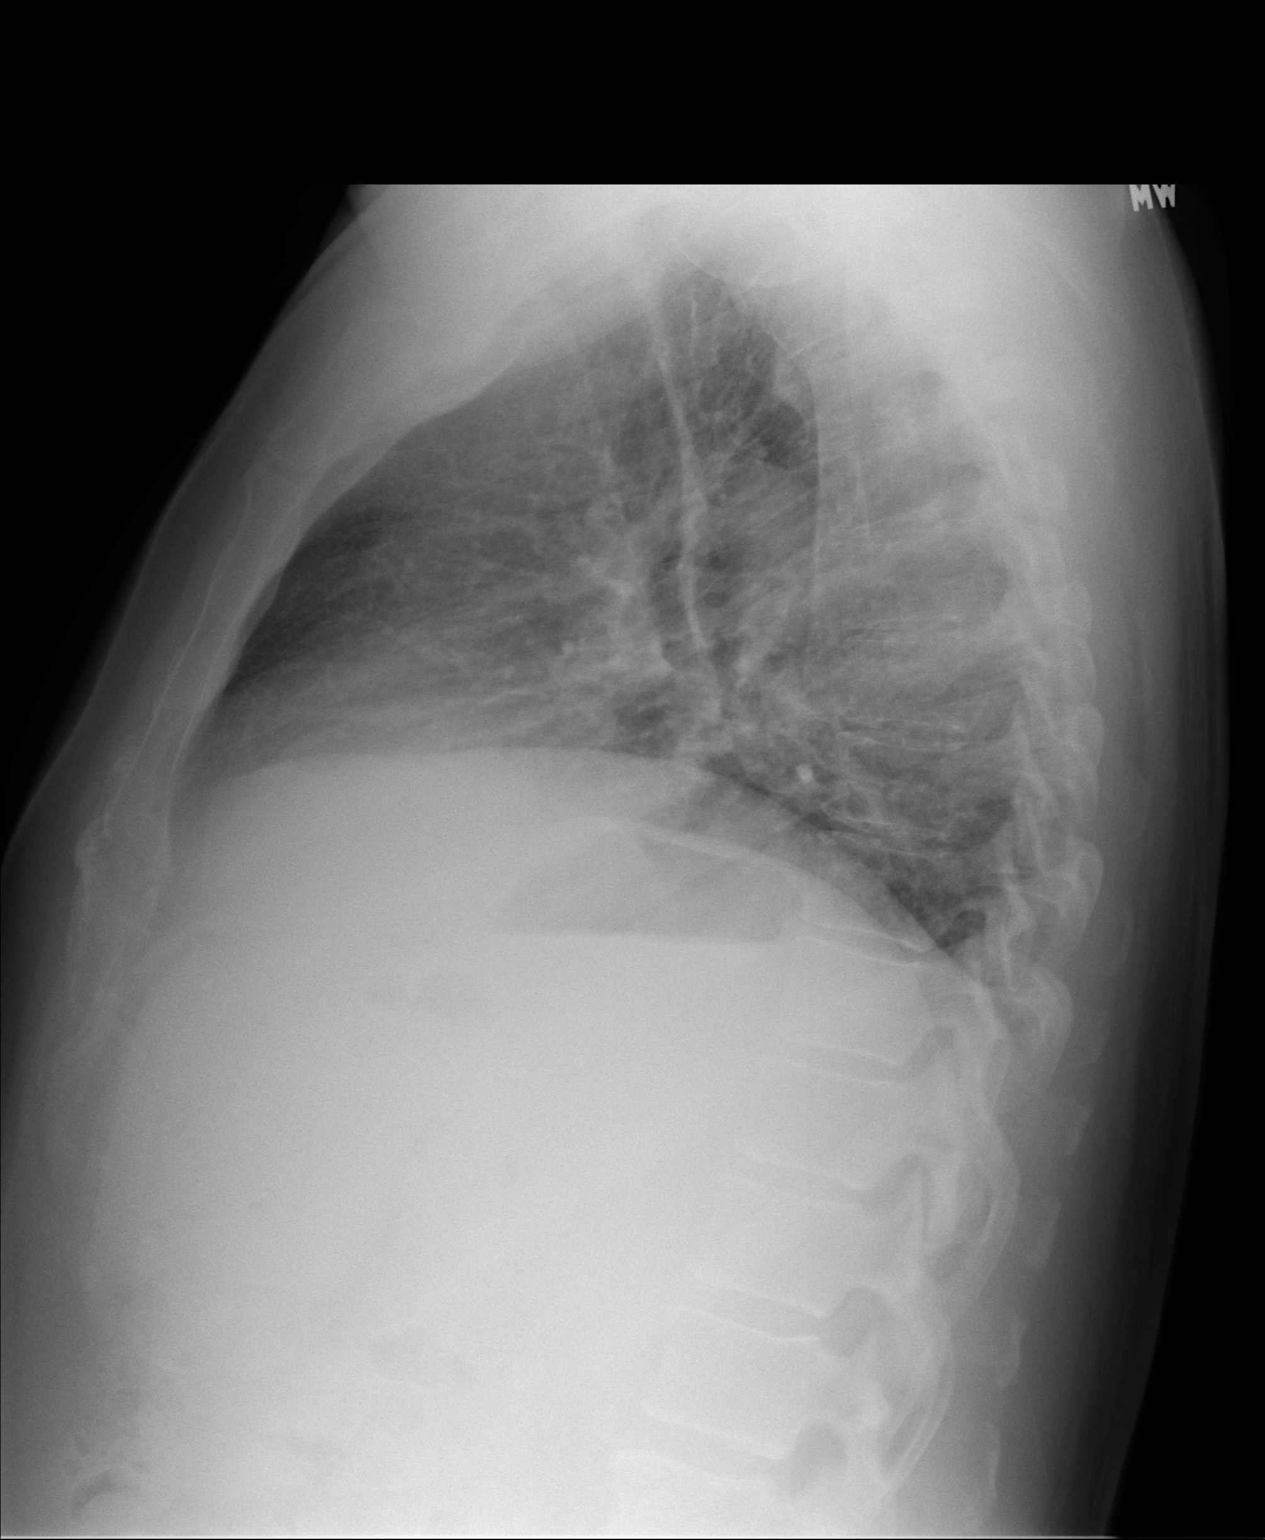

[2 of 2 positions shown; findings below may reference images not displayed]

FINDINGS: Healed left rib fractures with chronic chest wall deformity. Low
volume exam with basilar atelectasis and vascular congestion. Normal
heart size. No definite focal pneumonia, collapse or consolidation.
No effusion or pneumothorax.
IMPRESSION: Low volume exam with basilar vascular and congestion atelectasis.

Chronic left chest wall deformity from healed remote rib fractures

## 2015-08-05 DIAGNOSIS — N5201 Erectile dysfunction due to arterial insufficiency: Secondary | ICD-10-CM | POA: Diagnosis not present

## 2015-08-05 DIAGNOSIS — N401 Enlarged prostate with lower urinary tract symptoms: Secondary | ICD-10-CM | POA: Diagnosis not present

## 2015-08-05 DIAGNOSIS — R351 Nocturia: Secondary | ICD-10-CM | POA: Diagnosis not present

## 2015-08-05 DIAGNOSIS — R350 Frequency of micturition: Secondary | ICD-10-CM | POA: Diagnosis not present

## 2015-10-20 ENCOUNTER — Encounter (INDEPENDENT_AMBULATORY_CARE_PROVIDER_SITE_OTHER): Payer: Self-pay

## 2015-10-28 DIAGNOSIS — Z85828 Personal history of other malignant neoplasm of skin: Secondary | ICD-10-CM | POA: Diagnosis not present

## 2015-10-28 DIAGNOSIS — D692 Other nonthrombocytopenic purpura: Secondary | ICD-10-CM | POA: Diagnosis not present

## 2015-10-28 DIAGNOSIS — L578 Other skin changes due to chronic exposure to nonionizing radiation: Secondary | ICD-10-CM | POA: Diagnosis not present

## 2015-10-28 DIAGNOSIS — L812 Freckles: Secondary | ICD-10-CM | POA: Diagnosis not present

## 2015-10-28 DIAGNOSIS — L821 Other seborrheic keratosis: Secondary | ICD-10-CM | POA: Diagnosis not present

## 2015-10-28 DIAGNOSIS — L570 Actinic keratosis: Secondary | ICD-10-CM | POA: Diagnosis not present

## 2015-11-16 ENCOUNTER — Ambulatory Visit (INDEPENDENT_AMBULATORY_CARE_PROVIDER_SITE_OTHER): Payer: Medicare HMO | Admitting: Family Medicine

## 2015-11-16 ENCOUNTER — Encounter: Payer: Self-pay | Admitting: Family Medicine

## 2015-11-16 VITALS — BP 108/73 | HR 68 | Temp 97.8°F | Ht 72.0 in | Wt 215.0 lb

## 2015-11-16 DIAGNOSIS — N401 Enlarged prostate with lower urinary tract symptoms: Secondary | ICD-10-CM | POA: Diagnosis not present

## 2015-11-16 DIAGNOSIS — Z0000 Encounter for general adult medical examination without abnormal findings: Secondary | ICD-10-CM

## 2015-11-16 DIAGNOSIS — I10 Essential (primary) hypertension: Secondary | ICD-10-CM

## 2015-11-16 DIAGNOSIS — N138 Other obstructive and reflux uropathy: Secondary | ICD-10-CM

## 2015-11-16 LAB — MICROSCOPIC EXAMINATION

## 2015-11-16 LAB — URINALYSIS, ROUTINE W REFLEX MICROSCOPIC
BILIRUBIN UA: NEGATIVE
GLUCOSE, UA: NEGATIVE
Nitrite, UA: NEGATIVE
PH UA: 7.5 (ref 5.0–7.5)
RBC UA: NEGATIVE
Specific Gravity, UA: 1.02 (ref 1.005–1.030)
UUROB: 0.2 mg/dL (ref 0.2–1.0)

## 2015-11-16 NOTE — Assessment & Plan Note (Signed)
The current medical regimen is effective;  continue present plan and medications.  

## 2015-11-16 NOTE — Progress Notes (Signed)
BP 108/73 (BP Location: Left Arm, Patient Position: Sitting, Cuff Size: Normal)   Pulse 68   Temp 97.8 F (36.6 C)   Ht 6' (1.829 m)   Wt 215 lb (97.5 kg)   SpO2 98%   BMI 29.16 kg/m    Subjective:    Patient ID: Christopher Henderson, male    DOB: November 12, 1945, 70 y.o.   MRN: [id number]  HPI: Christopher Henderson is a 70 y.o. male  Chief Complaint  Patient presents with  . Annual Exam    Relevant past medical, surgical, family and social history reviewed and updated as indicated. Interim medical history since our last visit reviewed. Allergies and medications reviewed and updated.  Review of Systems  Constitutional: Negative.   HENT: Negative.   Eyes: Negative.   Respiratory: Negative.   Cardiovascular: Negative.   Gastrointestinal: Negative.   Endocrine: Negative.   Genitourinary: Negative.   Musculoskeletal: Negative.   Skin: Negative.   Allergic/Immunologic: Negative.   Neurological: Negative.   Hematological: Negative.   Psychiatric/Behavioral: Negative.     Per HPI unless specifically indicated above     Objective:    BP 108/73 (BP Location: Left Arm, Patient Position: Sitting, Cuff Size: Normal)   Pulse 68   Temp 97.8 F (36.6 C)   Ht 6' (1.829 m)   Wt 215 lb (97.5 kg)   SpO2 98%   BMI 29.16 kg/m   Wt Readings from Last 3 Encounters:  11/16/15 215 lb (97.5 kg)  05/05/15 214 lb (97.1 kg)  10/09/14 222 lb 9.6 oz (101 kg)    Physical Exam  Constitutional: He is oriented to person, place, and time. He appears well-developed and well-nourished.  HENT:  Head: Normocephalic.  Right Ear: External ear normal.  Left Ear: External ear normal.  Nose: Nose normal.  Eyes: Conjunctivae and EOM are normal. Pupils are equal, round, and reactive to light.  Neck: Normal range of motion. Neck supple. No thyromegaly present.  Cardiovascular: Normal rate, regular rhythm, normal heart sounds and intact distal pulses.   Pulmonary/Chest: Effort normal and breath sounds  normal.  Abdominal: Soft. Bowel sounds are normal. There is no splenomegaly or hepatomegaly.  Genitourinary:  Genitourinary Comments: Done in urology  Musculoskeletal: Normal range of motion.  Lymphadenopathy:    He has no cervical adenopathy.  Neurological: He is alert and oriented to person, place, and time. He has normal reflexes.  Skin: Skin is warm and dry.  Psychiatric: He has a normal mood and affect. His behavior is normal. Judgment and thought content normal.    AWV metrics met Patient doing well with no complaints blood pressure good control no side effects from medications and takes faithfully BPH followed by Dr. Eliberto Ivory with PSAs and has been doing well no complaints Is bothered by constant slight sniffling of his nose allergy type symptoms no fever or chills or other type cold symptoms. Wants to try something so he doesn't have to sniff all the time.  Results for orders placed or performed in visit on 29/51/88  Basic metabolic panel  Result Value Ref Range   Glucose 109 (H) 65 - 99 mg/dL   BUN 12 8 - 27 mg/dL   Creatinine, Ser 1.00 0.76 - 1.27 mg/dL   GFR calc non Af Amer 77 >59 mL/min/1.73   GFR calc Af Amer 89 >59 mL/min/1.73   BUN/Creatinine Ratio 12 10 - 22   Sodium 142 134 - 144 mmol/L   Potassium 5.4 (H) 3.5 - 5.2  mmol/L   Chloride 101 96 - 106 mmol/L   CO2 24 18 - 29 mmol/L   Calcium 9.7 8.6 - 10.2 mg/dL      Assessment & Plan:   Problem List Items Addressed This Visit      Cardiovascular and Mediastinum   Essential hypertension    The current medical regimen is effective;  continue present plan and medications.         Genitourinary   BPH with obstruction/lower urinary tract symptoms    The current medical regimen is effective;  continue present plan and medications.        Other Visit Diagnoses    Well adult exam    -  Primary   Relevant Orders   Urinalysis, Routine w reflex microscopic (not at [facility])   CBC with Differential/Platelet    Comprehensive metabolic panel   Lipid Panel w/o Chol/HDL Ratio   TSH       Follow up plan: Return in about 6 months (around 05/18/2016) for BMP.

## 2015-11-17 ENCOUNTER — Encounter: Payer: Self-pay | Admitting: Family Medicine

## 2015-11-17 LAB — CBC WITH DIFFERENTIAL/PLATELET
BASOS: 0 %
Basophils Absolute: 0 10*3/uL (ref 0.0–0.2)
EOS (ABSOLUTE): 0.3 10*3/uL (ref 0.0–0.4)
EOS: 5 %
HEMATOCRIT: 45.3 % (ref 37.5–51.0)
HEMOGLOBIN: 15.1 g/dL (ref 12.6–17.7)
IMMATURE GRANS (ABS): 0 10*3/uL (ref 0.0–0.1)
IMMATURE GRANULOCYTES: 0 %
LYMPHS: 28 %
Lymphocytes Absolute: 1.7 10*3/uL (ref 0.7–3.1)
MCH: 30.6 pg (ref 26.6–33.0)
MCHC: 33.3 g/dL (ref 31.5–35.7)
MCV: 92 fL (ref 79–97)
MONOCYTES: 7 %
Monocytes Absolute: 0.4 10*3/uL (ref 0.1–0.9)
NEUTROS ABS: 3.5 10*3/uL (ref 1.4–7.0)
NEUTROS PCT: 60 %
Platelets: 228 10*3/uL (ref 150–379)
RBC: 4.94 x10E6/uL (ref 4.14–5.80)
RDW: 12.7 % (ref 12.3–15.4)
WBC: 6 10*3/uL (ref 3.4–10.8)

## 2015-11-17 LAB — COMPREHENSIVE METABOLIC PANEL
A/G RATIO: 2 (ref 1.2–2.2)
ALBUMIN: 4.7 g/dL (ref 3.6–4.8)
ALT: 20 IU/L (ref 0–44)
AST: 23 IU/L (ref 0–40)
Alkaline Phosphatase: 59 IU/L (ref 39–117)
BILIRUBIN TOTAL: 0.9 mg/dL (ref 0.0–1.2)
BUN / CREAT RATIO: 10 (ref 10–24)
BUN: 11 mg/dL (ref 8–27)
CALCIUM: 9.8 mg/dL (ref 8.6–10.2)
CO2: 24 mmol/L (ref 18–29)
Chloride: 99 mmol/L (ref 96–106)
Creatinine, Ser: 1.06 mg/dL (ref 0.76–1.27)
GFR, EST AFRICAN AMERICAN: 82 mL/min/{1.73_m2} (ref >59)
GFR, EST NON AFRICAN AMERICAN: 71 mL/min/{1.73_m2} (ref >59)
Globulin, Total: 2.4 g/dL (ref 1.5–4.5)
Glucose: 107 mg/dL — ABNORMAL HIGH (ref 65–99)
Potassium: 4.9 mmol/L (ref 3.5–5.2)
Sodium: 141 mmol/L (ref 134–144)
TOTAL PROTEIN: 7.1 g/dL (ref 6.0–8.5)

## 2015-11-17 LAB — LIPID PANEL W/O CHOL/HDL RATIO
CHOLESTEROL TOTAL: 207 mg/dL — AB (ref 100–199)
HDL: 37 mg/dL — ABNORMAL LOW (ref >39)
LDL CALC: 136 mg/dL — AB (ref 0–99)
Triglycerides: 170 mg/dL — ABNORMAL HIGH (ref 0–149)
VLDL CHOLESTEROL CAL: 34 mg/dL (ref 5–40)

## 2015-11-17 LAB — TSH: TSH: 2.29 u[IU]/mL (ref 0.450–4.500)

## 2015-12-10 DIAGNOSIS — H40023 Open angle with borderline findings, high risk, bilateral: Secondary | ICD-10-CM | POA: Diagnosis not present

## 2015-12-17 DIAGNOSIS — R69 Illness, unspecified: Secondary | ICD-10-CM | POA: Diagnosis not present

## 2015-12-28 DIAGNOSIS — R69 Illness, unspecified: Secondary | ICD-10-CM | POA: Diagnosis not present

## 2016-05-23 ENCOUNTER — Ambulatory Visit (INDEPENDENT_AMBULATORY_CARE_PROVIDER_SITE_OTHER): Payer: Medicare HMO | Admitting: Family Medicine

## 2016-05-23 ENCOUNTER — Encounter: Payer: Self-pay | Admitting: Family Medicine

## 2016-05-23 VITALS — BP 118/81 | HR 71 | Ht 72.0 in | Wt 215.0 lb

## 2016-05-23 DIAGNOSIS — I10 Essential (primary) hypertension: Secondary | ICD-10-CM | POA: Diagnosis not present

## 2016-05-23 DIAGNOSIS — R4702 Dysphasia: Secondary | ICD-10-CM

## 2016-05-23 DIAGNOSIS — M67441 Ganglion, right hand: Secondary | ICD-10-CM

## 2016-05-23 MED ORDER — AMLODIPINE BESYLATE 10 MG PO TABS: 10.0000 mg | ORAL_TABLET | Freq: Every day | ORAL | 4 refills | Status: DC

## 2016-05-23 MED ORDER — BENAZEPRIL HCL 40 MG PO TABS: 40.0000 mg | ORAL_TABLET | Freq: Every day | ORAL | 4 refills | Status: DC

## 2016-05-23 NOTE — Assessment & Plan Note (Signed)
Because of worsening dysphagia will refer to GI to further evaluate Also patient to start Prilosec.

## 2016-05-23 NOTE — Progress Notes (Signed)
BP 118/81   Pulse 71   Ht 6' (1.829 m)   Wt 215 lb (97.5 kg)   SpO2 99%   BMI 29.16 kg/m    Subjective:    Patient ID: Christopher Henderson, male    DOB: Dec 04, 1945, 71 y.o.   MRN: [id number]  HPI: Christopher Henderson is a 71 y.o. male  Chief Complaint  Patient presents with  . Follow-up  . Hypertension  . Choking    Having trouble taking meds  . Hand Pain    Right hand middle finger.    Patient with several concerns. Patient with dysphasia food getting stuck in his throat unless he eats very slowly and take small bites and follows with water. This been gradually getting worse over the last several months similar to problem his dad had. Patient doesn't have any problems with heartburn or reflux issues.  Patient also with right third finger posterior surface adjacent to fingernail with large ganglion formation. As not ruptured and drained but is about to.  Hypertension doing well with medications no complaints.  Relevant past medical, surgical, family and social history reviewed and updated as indicated. Interim medical history since our last visit reviewed. Allergies and medications reviewed and updated.  Review of Systems  Constitutional: Negative.   Respiratory: Negative.   Cardiovascular: Negative.     Per HPI unless specifically indicated above     Objective:    BP 118/81   Pulse 71   Ht 6' (1.829 m)   Wt 215 lb (97.5 kg)   SpO2 99%   BMI 29.16 kg/m   Wt Readings from Last 3 Encounters:  05/23/16 215 lb (97.5 kg)  11/16/15 215 lb (97.5 kg)  05/05/15 214 lb (97.1 kg)    Physical Exam  Constitutional: He is oriented to person, place, and time. He appears well-developed and well-nourished.  HENT:  Head: Normocephalic and atraumatic.  Eyes: Conjunctivae and EOM are normal.  Neck: Normal range of motion.  Cardiovascular: Normal rate, regular rhythm and normal heart sounds.   Pulmonary/Chest: Effort normal and breath sounds normal.  Musculoskeletal: Normal  range of motion.  Right third finger adjacent to fingernail ganglion very close to the surface about to rupture.  Neurological: He is alert and oriented to person, place, and time.  Skin: No erythema.  Psychiatric: He has a normal mood and affect. His behavior is normal. Judgment and thought content normal.    Results for orders placed or performed in visit on 11/16/15  Microscopic Examination  Result Value Ref Range   WBC, UA 0-5 0 - 5 /hpf   RBC, UA 0-2 0 - 2 /hpf   Epithelial Cells (non renal) 0-10 0 - 10 /hpf   Casts Present (A) None seen /lpf   Cast Type Hyaline casts N/A   Mucus, UA Present Not Estab.   Bacteria, UA Few None seen/Few  Urinalysis, Routine w reflex microscopic (not at [facility])  Result Value Ref Range   Specific Gravity, UA 1.020 1.005 - 1.030   pH, UA 7.5 5.0 - 7.5   Color, UA Yellow Yellow   Appearance Ur Clear Clear   Leukocytes, UA Trace (A) Negative   Protein, UA 2+ (A) Negative/Trace   Glucose, UA Negative Negative   Ketones, UA Trace (A) Negative   RBC, UA Negative Negative   Bilirubin, UA Negative Negative   Urobilinogen, Ur 0.2 0.2 - 1.0 mg/dL   Nitrite, UA Negative Negative   Microscopic Examination See below:  CBC with Differential/Platelet  Result Value Ref Range   WBC 6.0 3.4 - 10.8 x10E3/uL   RBC 4.94 4.14 - 5.80 x10E6/uL   Hemoglobin 15.1 12.6 - 17.7 g/dL   Hematocrit 45.3 37.5 - 51.0 %   MCV 92 79 - 97 fL   MCH 30.6 26.6 - 33.0 pg   MCHC 33.3 31.5 - 35.7 g/dL   RDW 12.7 12.3 - 15.4 %   Platelets 228 150 - 379 x10E3/uL   Neutrophils 60 %   Lymphs 28 %   Monocytes 7 %   Eos 5 %   Basos 0 %   Neutrophils Absolute 3.5 1.4 - 7.0 x10E3/uL   Lymphocytes Absolute 1.7 0.7 - 3.1 x10E3/uL   Monocytes Absolute 0.4 0.1 - 0.9 x10E3/uL   EOS (ABSOLUTE) 0.3 0.0 - 0.4 x10E3/uL   Basophils Absolute 0.0 0.0 - 0.2 x10E3/uL   Immature Granulocytes 0 %   Immature Grans (Abs) 0.0 0.0 - 0.1 x10E3/uL  Comprehensive metabolic panel  Result Value Ref  Range   Glucose 107 (H) 65 - 99 mg/dL   BUN 11 8 - 27 mg/dL   Creatinine, Ser 1.06 0.76 - 1.27 mg/dL   GFR calc non Af Amer 71 >59 mL/min/1.73   GFR calc Af Amer 82 >59 mL/min/1.73   BUN/Creatinine Ratio 10 10 - 24   Sodium 141 134 - 144 mmol/L   Potassium 4.9 3.5 - 5.2 mmol/L   Chloride 99 96 - 106 mmol/L   CO2 24 18 - 29 mmol/L   Calcium 9.8 8.6 - 10.2 mg/dL   Total Protein 7.1 6.0 - 8.5 g/dL   Albumin 4.7 3.6 - 4.8 g/dL   Globulin, Total 2.4 1.5 - 4.5 g/dL   Albumin/Globulin Ratio 2.0 1.2 - 2.2   Bilirubin Total 0.9 0.0 - 1.2 mg/dL   Alkaline Phosphatase 59 39 - 117 IU/L   AST 23 0 - 40 IU/L   ALT 20 0 - 44 IU/L  Lipid Panel w/o Chol/HDL Ratio  Result Value Ref Range   Cholesterol, Total 207 (H) 100 - 199 mg/dL   Triglycerides 170 (H) 0 - 149 mg/dL   HDL 37 (L) >39 mg/dL   VLDL Cholesterol Cal 34 5 - 40 mg/dL   LDL Calculated 136 (H) 0 - 99 mg/dL  TSH  Result Value Ref Range   TSH 2.290 0.450 - 4.500 uIU/mL      Assessment & Plan:   Problem List Items Addressed This Visit      Cardiovascular and Mediastinum   Essential hypertension - Primary    The current medical regimen is effective;  continue present plan and medications.       Relevant Medications   amLODipine (NORVASC) 10 MG tablet   benazepril (LOTENSIN) 40 MG tablet   Other Relevant Orders   Basic metabolic panel     Musculoskeletal and Integument   Ganglion cyst of joint of finger of right hand    Cautioned patient about rupture and possible joint infection will refer to orthopedics to further evaluate.      Relevant Orders   Ambulatory referral to Orthopedic Surgery     Other   Dysphasia    Because of worsening dysphagia will refer to GI to further evaluate Also patient to start Prilosec.      Relevant Orders   Ambulatory referral to Gastroenterology       Follow up plan: Return in about 6 months (around 11/20/2016) for Physical Exam.

## 2016-05-23 NOTE — Assessment & Plan Note (Signed)
Cautioned patient about rupture and possible joint infection will refer to orthopedics to further evaluate.

## 2016-05-23 NOTE — Assessment & Plan Note (Signed)
The current medical regimen is effective;  continue present plan and medications.  

## 2016-05-24 ENCOUNTER — Encounter: Payer: Self-pay | Admitting: Family Medicine

## 2016-05-24 LAB — BASIC METABOLIC PANEL
BUN/Creatinine Ratio: 8 — ABNORMAL LOW (ref 10–24)
BUN: 8 mg/dL (ref 8–27)
CO2: 23 mmol/L (ref 18–29)
CREATININE: 0.98 mg/dL (ref 0.76–1.27)
Calcium: 9.6 mg/dL (ref 8.6–10.2)
Chloride: 102 mmol/L (ref 96–106)
GFR calc Af Amer: 91 mL/min/{1.73_m2} (ref >59)
GFR calc non Af Amer: 78 mL/min/{1.73_m2} (ref >59)
GLUCOSE: 121 mg/dL — AB (ref 65–99)
Potassium: 5.1 mmol/L (ref 3.5–5.2)
Sodium: 142 mmol/L (ref 134–144)

## 2016-05-30 DIAGNOSIS — M71341 Other bursal cyst, right hand: Secondary | ICD-10-CM | POA: Diagnosis not present

## 2016-06-09 DIAGNOSIS — H524 Presbyopia: Secondary | ICD-10-CM | POA: Diagnosis not present

## 2016-06-09 DIAGNOSIS — H40003 Preglaucoma, unspecified, bilateral: Secondary | ICD-10-CM | POA: Diagnosis not present

## 2016-06-14 ENCOUNTER — Other Ambulatory Visit: Payer: Self-pay

## 2016-06-14 ENCOUNTER — Encounter: Payer: Self-pay | Admitting: Gastroenterology

## 2016-06-14 ENCOUNTER — Ambulatory Visit (INDEPENDENT_AMBULATORY_CARE_PROVIDER_SITE_OTHER): Payer: 59 | Admitting: Gastroenterology

## 2016-06-14 VITALS — BP 130/74 | HR 95 | Temp 97.9°F | Ht 72.0 in | Wt 226.5 lb

## 2016-06-14 DIAGNOSIS — R1310 Dysphagia, unspecified: Secondary | ICD-10-CM

## 2016-06-14 DIAGNOSIS — R1319 Other dysphagia: Secondary | ICD-10-CM

## 2016-06-14 NOTE — Progress Notes (Signed)
Gastroenterology Consultation  Referring Provider:     Guadalupe Maple, MD Primary Care Physician:  Golden Pop, MD Primary Gastroenterologist:  Dr. Allen Norris     Reason for Consultation:     Dysphagia        HPI:   Christopher Henderson is a 71 y.o. y/o male referred for consultation & management of Dysphagia by Dr. Golden Pop, MD.  The patient comes in today with a report of dysphagia.  The patient states that he had a colonoscopy for a history of polyps a few years ago and is not due for another colonoscopy.  The patient does report that he has been having problems with swallowing.  The patient reports that his problems are only with solid foods and worse with steak and chicken.  The patient denies any unexplained weight loss associated with his problems swallowing.  He also denies any history of dysphagia in the past or the need for an upper endoscopy with dilation.  History reviewed. No pertinent past medical history.  Past Surgical History:  Procedure Laterality Date  . EYE SURGERY  2014   cataract Jan and Feb with implants  . HAND / FINGER TENDON LESION EXCISION Left 2000  . I&D EXTREMITY Left 06/18/2012   Procedure: Irrigation and Debridement with wound exploration Left Hand and percutaneous pinning of left Fifth finger and third metacarpal;  Surgeon: Schuyler Amor, MD;  Location: [place];  Service: Orthopedics;  Laterality: Left;  . KNEE SURGERY Left 1972    Prior to Admission medications   Medication Sig Start Date End Date Taking? Authorizing Provider  amLODipine (NORVASC) 10 MG tablet Take 1 tablet (10 mg total) by mouth daily. 05/23/16  Yes Guadalupe Maple, MD  aspirin 81 MG tablet Take 81 mg by mouth daily.   Yes Historical Provider, MD  benazepril (LOTENSIN) 40 MG tablet Take 1 tablet (40 mg total) by mouth daily. 05/23/16  Yes Guadalupe Maple, MD  dutasteride (AVODART) 0.5 MG capsule Take 0.5 mg by mouth every other day.   Yes Historical Provider, MD  Omega-3 Fatty Acids  (FISH OIL) 1000 MG CAPS Take 1,000 mg by mouth 2 (two) times daily.   Yes Historical Provider, MD  sildenafil (VIAGRA) 100 MG tablet Take 100 mg by mouth daily as needed for erectile dysfunction.   Yes Historical Provider, MD    Family History  Problem Relation Age of Onset  . Hypertension Mother   . Diabetes Mother   . Thyroid disease Mother   . Glaucoma Father   . Cancer Sister     colon and breast  . Alzheimer's disease Sister   . Diabetes Brother   . Heart disease Brother   . Seizures Sister      Social History  Substance Use Topics  . Smoking status: Never Smoker  . Smokeless tobacco: Never Used  . Alcohol use No    Allergies as of 06/14/2016 - Review Complete 06/14/2016  Allergen Reaction Noted  . Penicillins Hives 06/18/2012    Review of Systems:    All systems reviewed and negative except where noted in HPI.   Physical Exam:  BP 130/74   Pulse 95   Temp 97.9 F (36.6 C) (Oral)   Ht 6' (1.829 m)   Wt 226 lb 8 oz (102.7 kg)   BMI 30.72 kg/m  No LMP for male patient. Psych:  Alert and cooperative. Normal mood and affect. General:   Alert,  Well-developed, well-nourished, pleasant and  cooperative in NAD Head:  Normocephalic and atraumatic. Eyes:  Sclera clear, no icterus.   Conjunctiva pink. Ears:  Normal auditory acuity. Nose:  No deformity, discharge, or lesions. Mouth:  No deformity or lesions,oropharynx pink & moist. Neck:  Supple; no masses or thyromegaly. Lungs:  Respirations even and unlabored.  Clear throughout to auscultation.   No wheezes, crackles, or rhonchi. No acute distress. Heart:  Regular rate and rhythm; no murmurs, clicks, rubs, or gallops. Abdomen:  Normal bowel sounds.  No bruits.  Soft, non-tender and non-distended without masses, hepatosplenomegaly or hernias noted.  No guarding or rebound tenderness.  Negative Carnett sign.   Rectal:  Deferred.  Msk:  Symmetrical without gross deformities.  Good, equal movement & strength  bilaterally. Pulses:  Normal pulses noted. Extremities:  No clubbing or edema.  No cyanosis. Neurologic:  Alert and oriented x3;  grossly normal neurologically. Skin:  Intact without significant lesions or rashes.  No jaundice. Lymph Nodes:  No significant cervical adenopathy. Psych:  Alert and cooperative. Normal mood and affect.  Imaging Studies: No results found.  Assessment and Plan:   Christopher Henderson is a 71 y.o. y/o male who comes in today with a history of a few months of dysphagia.  The patient denies it being progressive but he has become more concerned about it recently.  The patient will be set up for a upper endoscopy to look for the cause of his dysphagia. I have discussed risks & benefits which include, but are not limited to, bleeding, infection, perforation & drug reaction.  The patient agrees with this plan & written consent will be obtained.       Lucilla Lame, MD. Marval Regal   Note: This dictation was prepared with Dragon dictation along with smaller phrase technology. Any transcriptional errors that result from this process are unintentional.

## 2016-06-15 ENCOUNTER — Other Ambulatory Visit: Payer: Self-pay

## 2016-06-15 DIAGNOSIS — R1310 Dysphagia, unspecified: Secondary | ICD-10-CM

## 2016-06-15 DIAGNOSIS — R1319 Other dysphagia: Secondary | ICD-10-CM

## 2016-06-16 DIAGNOSIS — M71341 Other bursal cyst, right hand: Secondary | ICD-10-CM | POA: Diagnosis not present

## 2016-06-20 ENCOUNTER — Encounter: Payer: Self-pay | Admitting: *Deleted

## 2016-06-23 ENCOUNTER — Encounter
Admission: RE | Disposition: A | Discharge status: Home / Self Care | Payer: Self-pay | Source: Ambulatory Visit | Attending: Gastroenterology

## 2016-06-23 ENCOUNTER — Ambulatory Visit: Payer: Medicare HMO | Admitting: Anesthesiology

## 2016-06-23 ENCOUNTER — Ambulatory Visit
Admission: RE | Admit: 2016-06-23 | Discharge: 2016-06-23 | Disposition: A | Discharge status: Home / Self Care | Payer: Medicare HMO | Source: Ambulatory Visit | Attending: Gastroenterology | Admitting: Gastroenterology

## 2016-06-23 DIAGNOSIS — R1310 Dysphagia, unspecified: Secondary | ICD-10-CM | POA: Diagnosis not present

## 2016-06-23 DIAGNOSIS — Z7982 Long term (current) use of aspirin: Secondary | ICD-10-CM | POA: Insufficient documentation

## 2016-06-23 DIAGNOSIS — K449 Diaphragmatic hernia without obstruction or gangrene: Secondary | ICD-10-CM | POA: Insufficient documentation

## 2016-06-23 DIAGNOSIS — I10 Essential (primary) hypertension: Secondary | ICD-10-CM | POA: Diagnosis not present

## 2016-06-23 DIAGNOSIS — Z79899 Other long term (current) drug therapy: Secondary | ICD-10-CM | POA: Insufficient documentation

## 2016-06-23 DIAGNOSIS — K222 Esophageal obstruction: Secondary | ICD-10-CM | POA: Diagnosis not present

## 2016-06-23 HISTORY — DX: Presence of dental prosthetic device (complete) (partial): Z97.2

## 2016-06-23 HISTORY — PX: ESOPHAGEAL DILATION: SHX303

## 2016-06-23 HISTORY — PX: ESOPHAGOGASTRODUODENOSCOPY (EGD) WITH PROPOFOL: SHX5813

## 2016-06-23 SURGERY — ESOPHAGOGASTRODUODENOSCOPY (EGD) WITH PROPOFOL
Anesthesia: Monitor Anesthesia Care | Wound class: Clean Contaminated

## 2016-06-23 MED ORDER — LIDOCAINE HCL (CARDIAC) 20 MG/ML IV SOLN
INTRAVENOUS | Status: DC | PRN
  Administered 2016-06-23: 40 mg via INTRAVENOUS

## 2016-06-23 MED ORDER — LACTATED RINGERS IV SOLN
INTRAVENOUS | Status: DC | PRN
  Administered 2016-06-23: 11:00:00 via INTRAVENOUS

## 2016-06-23 MED ORDER — GLYCOPYRROLATE 0.2 MG/ML IJ SOLN
INTRAMUSCULAR | Status: DC | PRN
  Administered 2016-06-23: 0.2 mg via INTRAVENOUS

## 2016-06-23 MED ORDER — PROPOFOL 10 MG/ML IV BOLUS
INTRAVENOUS | Status: DC | PRN
  Administered 2016-06-23: 30 mg via INTRAVENOUS
  Administered 2016-06-23 (×2): 20 mg via INTRAVENOUS
  Administered 2016-06-23: 10 mg via INTRAVENOUS
  Administered 2016-06-23: 80 mg via INTRAVENOUS
  Administered 2016-06-23: 30 mg via INTRAVENOUS

## 2016-06-23 SURGICAL SUPPLY — 32 items
BALLN DILATOR 10-12 8 (BALLOONS)
BALLN DILATOR 12-15 8 (BALLOONS)
BALLN DILATOR 15-18 8 (BALLOONS) ×3
BALLN DILATOR CRE 0-12 8 (BALLOONS)
BALLN DILATOR ESOPH 8 10 CRE (MISCELLANEOUS) IMPLANT
BALLOON DILATOR 12-15 8 (BALLOONS) IMPLANT
BALLOON DILATOR 15-18 8 (BALLOONS) ×2 IMPLANT
BALLOON DILATOR CRE 0-12 8 (BALLOONS) IMPLANT
BLOCK BITE 60FR ADLT L/F GRN (MISCELLANEOUS) ×3 IMPLANT
CANISTER SUCT 1200ML W/VALVE (MISCELLANEOUS) ×3 IMPLANT
CLIP HMST 235XBRD CATH ROT (MISCELLANEOUS) IMPLANT
CLIP RESOLUTION 360 11X235 (MISCELLANEOUS)
FCP ESCP3.2XJMB 240X2.8X (MISCELLANEOUS)
FORCEPS BIOP RAD 4 LRG CAP 4 (CUTTING FORCEPS) IMPLANT
FORCEPS BIOP RJ4 240 W/NDL (MISCELLANEOUS)
FORCEPS ESCP3.2XJMB 240X2.8X (MISCELLANEOUS) IMPLANT
GOWN CVR UNV OPN BCK APRN NK (MISCELLANEOUS) ×4 IMPLANT
GOWN ISOL THUMB LOOP REG UNIV (MISCELLANEOUS) ×2
INJECTOR VARIJECT VIN23 (MISCELLANEOUS) IMPLANT
KIT DEFENDO VALVE AND CONN (KITS) IMPLANT
KIT ENDO PROCEDURE OLY (KITS) ×3 IMPLANT
MARKER SPOT ENDO TATTOO 5ML (MISCELLANEOUS) IMPLANT
PAD GROUND ADULT SPLIT (MISCELLANEOUS) IMPLANT
RETRIEVER NET PLAT FOOD (MISCELLANEOUS) IMPLANT
SNARE SHORT THROW 13M SML OVAL (MISCELLANEOUS) IMPLANT
SNARE SHORT THROW 30M LRG OVAL (MISCELLANEOUS) IMPLANT
SPOT EX ENDOSCOPIC TATTOO (MISCELLANEOUS)
SYR INFLATION 60ML (SYRINGE) ×3 IMPLANT
TRAP ETRAP POLY (MISCELLANEOUS) IMPLANT
VARIJECT INJECTOR VIN23 (MISCELLANEOUS)
WATER STERILE IRR 250ML POUR (IV SOLUTION) ×3 IMPLANT
WIRE CRE 18-20MM 8CM F G (MISCELLANEOUS) IMPLANT

## 2016-06-23 NOTE — Anesthesia Preprocedure Evaluation (Signed)
Anesthesia Evaluation  Patient identified by MRN, date of birth, ID band Patient awake    Reviewed: Allergy & Precautions, H&P , NPO status , Patient's Chart, lab work & pertinent test results  Airway Mallampati: II  TM Distance: >3 FB Neck ROM: full    Dental no notable dental hx.    Pulmonary    Pulmonary exam normal        Cardiovascular hypertension, Normal cardiovascular exam     Neuro/Psych    GI/Hepatic   Endo/Other    Renal/GU      Musculoskeletal   Abdominal   Peds  Hematology   Anesthesia Other Findings   Reproductive/Obstetrics                             Anesthesia Physical Anesthesia Plan  ASA: II  Anesthesia Plan: MAC   Post-op Pain Management:    Induction:   Airway Management Planned:   Additional Equipment:   Intra-op Plan:   Post-operative Plan:   Informed Consent: I have reviewed the patients History and Physical, chart, labs and discussed the procedure including the risks, benefits and alternatives for the proposed anesthesia with the patient or authorized representative who has indicated his/her understanding and acceptance.     Plan Discussed with:   Anesthesia Plan Comments:         Anesthesia Quick Evaluation

## 2016-06-23 NOTE — H&P (Signed)
Christopher Lame, MD [address] [place], [place] [address] [phone] Fax : [phone]  Primary Care Physician:  Golden Pop, MD Primary Gastroenterologist:  Dr. Allen Norris  Pre-Procedure History & Physical: HPI:  Christopher Henderson is a 71 y.o. male is here for an endoscopy.   Past Medical History:  Diagnosis Date  . Dental bridge present    also some implants  . Hypertension     Past Surgical History:  Procedure Laterality Date  . EYE SURGERY  2014   cataract Jan and Feb with implants  . HAND / FINGER TENDON LESION EXCISION Left 2000  . I&D EXTREMITY Left 06/18/2012   Procedure: Irrigation and Debridement with wound exploration Left Hand and percutaneous pinning of left Fifth finger and third metacarpal;  Surgeon: Schuyler Amor, MD;  Location: [facility];  Service: Orthopedics;  Laterality: Left;  . KNEE SURGERY Left 1972    Prior to Admission medications   Medication Sig Start Date End Date Taking? Authorizing Provider  amLODipine (NORVASC) 10 MG tablet Take 1 tablet (10 mg total) by mouth daily. 05/23/16  Yes Guadalupe Maple, MD  Apoaequorin (PREVAGEN PO) Take by mouth.   Yes Historical Provider, MD  Ascorbic Acid (VITAMIN C PO) Take by mouth.   Yes Historical Provider, MD  aspirin 81 MG tablet Take 81 mg by mouth daily.   Yes Historical Provider, MD  benazepril (LOTENSIN) 40 MG tablet Take 1 tablet (40 mg total) by mouth daily. 05/23/16  Yes Guadalupe Maple, MD  Cholecalciferol (VITAMIN D PO) Take by mouth.   Yes Historical Provider, MD  dutasteride (AVODART) 0.5 MG capsule Take 0.5 mg by mouth every other day.   Yes Historical Provider, MD  Omega-3 Fatty Acids (FISH OIL) 1000 MG CAPS Take 1,000 mg by mouth 2 (two) times daily.   Yes Historical Provider, MD  sildenafil (VIAGRA) 100 MG tablet Take 100 mg by mouth daily as needed for erectile dysfunction.   Yes Historical Provider, MD    Allergies as of 06/15/2016 - Review Complete 06/14/2016  Allergen  Reaction Noted  . Penicillins Hives 06/18/2012    Family History  Problem Relation Age of Onset  . Hypertension Mother   . Diabetes Mother   . Thyroid disease Mother   . Glaucoma Father   . Cancer Sister     colon and breast  . Alzheimer's disease Sister   . Diabetes Brother   . Heart disease Brother   . Seizures Sister     Social History   Social History  . Marital status: Married    Spouse name: N/A  . Number of children: N/A  . Years of education: N/A   Occupational History  . Not on file.   Social History Main Topics  . Smoking status: Never Smoker  . Smokeless tobacco: Never Used  . Alcohol use No  . Drug use: No  . Sexual activity: Yes   Other Topics Concern  . Not on file   Social History Narrative  . No narrative on file    Review of Systems: See HPI, otherwise negative ROS  Physical Exam: BP (!) 139/93   Pulse 61   Temp 97.7 F (36.5 C) (Temporal)   Ht 6' (1.829 m)   Wt 218 lb (98.9 kg)   SpO2 98%   BMI 29.57 kg/m  General:   Alert,  pleasant and cooperative in NAD Head:  Normocephalic and atraumatic. Neck:  Supple; no masses or thyromegaly. Lungs:  Clear throughout to auscultation.    Heart:  Regular rate and rhythm. Abdomen:  Soft, nontender and nondistended. Normal bowel sounds, without guarding, and without rebound.   Neurologic:  Alert and  oriented x4;  grossly normal neurologically.  Impression/Plan: [name] is here for an endoscopy to be performed for dysphagia  Risks, benefits, limitations, and alternatives regarding  endoscopy have been reviewed with the patient.  Questions have been answered.  All parties agreeable.   Christopher Lame, MD  06/23/2016, 10:34 AM

## 2016-06-23 NOTE — Op Note (Signed)
[facility] Patient Name: Christopher Henderson Procedure Date: 06/23/2016 11:00 AM MRN: [id number] Account #: [id number] Date of Birth: 08-Dec-1945 Admit Type: Outpatient Age: 71 Room: [facility] OR ROOM 01 Gender: Male Note Status: Finalized Procedure:            Upper GI endoscopy Indications:          Dysphagia Providers:            Lucilla Lame MD, MD Referring MD:         Guadalupe Maple, MD (Referring MD) Medicines:            Propofol per Anesthesia Complications:        No immediate complications. Procedure:            Pre-Anesthesia Assessment:                       - Prior to the procedure, a History and Physical was                        performed, and patient medications and allergies were                        reviewed. The patient's tolerance of previous                        anesthesia was also reviewed. The risks and benefits of                        the procedure and the sedation options and risks were                        discussed with the patient. All questions were                        answered, and informed consent was obtained. Prior                        Anticoagulants: The patient has taken no previous                        anticoagulant or antiplatelet agents. ASA Grade                        Assessment: II - A patient with mild systemic disease.                        After reviewing the risks and benefits, the patient was                        deemed in satisfactory condition to undergo the                        procedure.                       After obtaining informed consent, the endoscope was                        passed under direct vision. Throughout the procedure,  the patient's blood pressure, pulse, and oxygen                        saturations were monitored continuously. The Olympus                        GIF H180J Endoscope (S#: [id number]) was introduced                        through the  mouth, and advanced to the second part of                        duodenum. The upper GI endoscopy was accomplished                        without difficulty. The patient tolerated the procedure                        well. Findings:      A small hiatal hernia was present.      One moderate benign-appearing, intrinsic stenosis was found in the lower       third of the esophagus. And was traversed. A TTS dilator was passed       through the scope. Dilation with a 12-13.5-15 mm balloon dilator was       performed to 15 mm. The dilation site was examined following endoscope       reinsertion and showed moderate improvement in luminal narrowing.      The stomach was normal.      The examined duodenum was normal. Impression:           - Small hiatal hernia.                       - Benign-appearing esophageal stenosis. Dilated.                       - Normal stomach.                       - Normal examined duodenum.                       - No specimens collected. Recommendation:       - Discharge patient to home.                       - Resume previous diet.                       - Continue present medications.                       - Await pathology results. Procedure Code(s):    --- Professional ---                       [phone], Esophagogastroduodenoscopy, flexible, transoral;                        with transendoscopic balloon dilation of esophagus                        (less than 30 mm diameter) Diagnosis Code(s):    ---  Professional ---                       R13.10, Dysphagia, unspecified                       K22.2, Esophageal obstruction CPT copyright 2016 American Medical Association. All rights reserved. The codes documented in this report are preliminary and upon coder review may  be revised to meet current compliance requirements. Lucilla Lame MD, MD 06/23/2016 11:24:35 AM This report has been signed electronically. Number of Addenda: 0 Note Initiated On: 06/23/2016 11:00  AM Total Procedure Duration: 0 hours 4 minutes 16 seconds       [facility]

## 2016-06-23 NOTE — Anesthesia Procedure Notes (Signed)
Procedure Name: MAC Performed by: Mayme Genta Pre-anesthesia Checklist: Patient identified, Emergency Drugs available, Suction available, Timeout performed and Patient being monitored Patient Re-evaluated:Patient Re-evaluated prior to inductionOxygen Delivery Method: Nasal cannula Placement Confirmation: positive ETCO2

## 2016-06-23 NOTE — Anesthesia Postprocedure Evaluation (Signed)
Anesthesia Post Note  Patient: Christopher Henderson  Procedure(s) Performed: Procedure(s) (LRB): ESOPHAGOGASTRODUODENOSCOPY (EGD) WITH PROPOFOL (N/A) ESOPHAGEAL DILATION  Patient location during evaluation: PACU Anesthesia Type: MAC Level of consciousness: awake and alert and oriented Pain management: satisfactory to patient Vital Signs Assessment: post-procedure vital signs reviewed and stable Respiratory status: spontaneous breathing, nonlabored ventilation and respiratory function stable Cardiovascular status: blood pressure returned to baseline and stable Postop Assessment: Adequate PO intake and No signs of nausea or vomiting Anesthetic complications: no    Raliegh Ip

## 2016-06-23 NOTE — Transfer of Care (Signed)
Immediate Anesthesia Transfer of Care Note  Patient: Christopher Henderson  Procedure(s) Performed: Procedure(s): ESOPHAGOGASTRODUODENOSCOPY (EGD) WITH PROPOFOL (N/A) ESOPHAGEAL DILATION  Patient Location: PACU  Anesthesia Type: MAC  Level of Consciousness: awake, alert  and patient cooperative  Airway and Oxygen Therapy: Patient Spontanous Breathing and Patient connected to supplemental oxygen  Post-op Assessment: Post-op Vital signs reviewed, Patient's Cardiovascular Status Stable, Respiratory Function Stable, Patent Airway and No signs of Nausea or vomiting  Post-op Vital Signs: Reviewed and stable  Complications: No apparent anesthesia complications

## 2016-07-27 ENCOUNTER — Telehealth: Payer: Self-pay | Admitting: Gastroenterology

## 2016-07-27 NOTE — Telephone Encounter (Signed)
Patient needs to schedule EGD with Dr. Allen Norris. If he doesn't answer his home phone please try the cell [phone]

## 2016-08-01 ENCOUNTER — Other Ambulatory Visit: Payer: Self-pay

## 2016-08-01 DIAGNOSIS — R1319 Other dysphagia: Secondary | ICD-10-CM

## 2016-08-01 DIAGNOSIS — R1310 Dysphagia, unspecified: Secondary | ICD-10-CM

## 2016-08-01 NOTE — Telephone Encounter (Signed)
Pt scheduled for an EGD at [facility] on 08/12/16 with Wohl. Instructs mailed.   Please precert for Dysphagia Z48.27

## 2016-08-02 ENCOUNTER — Telehealth: Payer: Self-pay | Admitting: Gastroenterology

## 2016-08-02 NOTE — Telephone Encounter (Signed)
08/02/16 Faxed Prior Auth form to [facility] for EGD 31438 / R13.10.

## 2016-08-03 DIAGNOSIS — H9313 Tinnitus, bilateral: Secondary | ICD-10-CM | POA: Diagnosis not present

## 2016-08-03 DIAGNOSIS — Z9849 Cataract extraction status, unspecified eye: Secondary | ICD-10-CM | POA: Diagnosis not present

## 2016-08-03 DIAGNOSIS — Z0000 Encounter for general adult medical examination without abnormal findings: Secondary | ICD-10-CM | POA: Diagnosis not present

## 2016-08-03 DIAGNOSIS — N529 Male erectile dysfunction, unspecified: Secondary | ICD-10-CM | POA: Diagnosis not present

## 2016-08-03 DIAGNOSIS — I10 Essential (primary) hypertension: Secondary | ICD-10-CM | POA: Diagnosis not present

## 2016-08-03 DIAGNOSIS — R233 Spontaneous ecchymoses: Secondary | ICD-10-CM | POA: Diagnosis not present

## 2016-08-03 DIAGNOSIS — N402 Nodular prostate without lower urinary tract symptoms: Secondary | ICD-10-CM | POA: Diagnosis not present

## 2016-08-03 DIAGNOSIS — Z6830 Body mass index (BMI) 30.0-30.9, adult: Secondary | ICD-10-CM | POA: Diagnosis not present

## 2016-08-03 DIAGNOSIS — E669 Obesity, unspecified: Secondary | ICD-10-CM | POA: Diagnosis not present

## 2016-08-03 DIAGNOSIS — H9113 Presbycusis, bilateral: Secondary | ICD-10-CM | POA: Diagnosis not present

## 2016-08-03 DIAGNOSIS — K222 Esophageal obstruction: Secondary | ICD-10-CM | POA: Diagnosis not present

## 2016-08-09 NOTE — Discharge Instructions (Signed)

## 2016-08-12 ENCOUNTER — Encounter
Admission: RE | Disposition: A | Discharge status: Home / Self Care | Payer: Self-pay | Source: Ambulatory Visit | Attending: Gastroenterology

## 2016-08-12 ENCOUNTER — Ambulatory Visit: Payer: Medicare HMO | Admitting: Anesthesiology

## 2016-08-12 ENCOUNTER — Ambulatory Visit
Admission: RE | Admit: 2016-08-12 | Discharge: 2016-08-12 | Disposition: A | Discharge status: Home / Self Care | Payer: Medicare HMO | Source: Ambulatory Visit | Attending: Gastroenterology | Admitting: Gastroenterology

## 2016-08-12 DIAGNOSIS — Z79899 Other long term (current) drug therapy: Secondary | ICD-10-CM | POA: Diagnosis not present

## 2016-08-12 DIAGNOSIS — K222 Esophageal obstruction: Secondary | ICD-10-CM | POA: Diagnosis not present

## 2016-08-12 DIAGNOSIS — R1310 Dysphagia, unspecified: Secondary | ICD-10-CM | POA: Diagnosis not present

## 2016-08-12 DIAGNOSIS — K449 Diaphragmatic hernia without obstruction or gangrene: Secondary | ICD-10-CM | POA: Insufficient documentation

## 2016-08-12 DIAGNOSIS — I10 Essential (primary) hypertension: Secondary | ICD-10-CM | POA: Diagnosis not present

## 2016-08-12 DIAGNOSIS — Z7982 Long term (current) use of aspirin: Secondary | ICD-10-CM | POA: Insufficient documentation

## 2016-08-12 HISTORY — PX: ESOPHAGEAL DILATION: SHX303

## 2016-08-12 HISTORY — PX: ESOPHAGOGASTRODUODENOSCOPY (EGD) WITH PROPOFOL: SHX5813

## 2016-08-12 SURGERY — ESOPHAGOGASTRODUODENOSCOPY (EGD) WITH PROPOFOL
Anesthesia: Monitor Anesthesia Care | Wound class: Clean Contaminated

## 2016-08-12 MED ORDER — LACTATED RINGERS IV SOLN
INTRAVENOUS | Status: DC
  Administered 2016-08-12: 09:00:00 via INTRAVENOUS

## 2016-08-12 MED ORDER — LACTATED RINGERS IV SOLN
INTRAVENOUS | Status: DC
  Administered 2016-08-12: 08:00:00 via INTRAVENOUS

## 2016-08-12 MED ORDER — PANTOPRAZOLE SODIUM 40 MG PO TBEC: 40.0000 mg | DELAYED_RELEASE_TABLET | Freq: Every day | ORAL | 3 refills | Status: DC

## 2016-08-12 MED ORDER — GLYCOPYRROLATE 0.2 MG/ML IJ SOLN
INTRAMUSCULAR | Status: DC | PRN
  Administered 2016-08-12: 0.2 mg via INTRAVENOUS

## 2016-08-12 MED ORDER — PROPOFOL 10 MG/ML IV BOLUS
INTRAVENOUS | Status: DC | PRN
  Administered 2016-08-12: 70 mg via INTRAVENOUS
  Administered 2016-08-12: 20 mg via INTRAVENOUS
  Administered 2016-08-12 (×3): 30 mg via INTRAVENOUS

## 2016-08-12 MED ORDER — LIDOCAINE HCL (CARDIAC) 20 MG/ML IV SOLN
INTRAVENOUS | Status: DC | PRN
  Administered 2016-08-12: 50 mg via INTRAVENOUS

## 2016-08-12 SURGICAL SUPPLY — 32 items
BALLN DILATOR 10-12 8 (BALLOONS)
BALLN DILATOR 12-15 8 (BALLOONS)
BALLN DILATOR 15-18 8 (BALLOONS) ×3
BALLN DILATOR CRE 0-12 8 (BALLOONS)
BALLN DILATOR ESOPH 8 10 CRE (MISCELLANEOUS) IMPLANT
BALLOON DILATOR 12-15 8 (BALLOONS) IMPLANT
BALLOON DILATOR 15-18 8 (BALLOONS) ×2 IMPLANT
BALLOON DILATOR CRE 0-12 8 (BALLOONS) IMPLANT
BLOCK BITE 60FR ADLT L/F GRN (MISCELLANEOUS) ×3 IMPLANT
CANISTER SUCT 1200ML W/VALVE (MISCELLANEOUS) ×3 IMPLANT
CLIP HMST 235XBRD CATH ROT (MISCELLANEOUS) IMPLANT
CLIP RESOLUTION 360 11X235 (MISCELLANEOUS)
FCP ESCP3.2XJMB 240X2.8X (MISCELLANEOUS)
FORCEPS BIOP RAD 4 LRG CAP 4 (CUTTING FORCEPS) IMPLANT
FORCEPS BIOP RJ4 240 W/NDL (MISCELLANEOUS)
FORCEPS ESCP3.2XJMB 240X2.8X (MISCELLANEOUS) IMPLANT
GOWN CVR UNV OPN BCK APRN NK (MISCELLANEOUS) ×4 IMPLANT
GOWN ISOL THUMB LOOP REG UNIV (MISCELLANEOUS) ×2
INJECTOR VARIJECT VIN23 (MISCELLANEOUS) IMPLANT
KIT DEFENDO VALVE AND CONN (KITS) IMPLANT
KIT ENDO PROCEDURE OLY (KITS) ×3 IMPLANT
MARKER SPOT ENDO TATTOO 5ML (MISCELLANEOUS) IMPLANT
PAD GROUND ADULT SPLIT (MISCELLANEOUS) IMPLANT
RETRIEVER NET PLAT FOOD (MISCELLANEOUS) IMPLANT
SNARE SHORT THROW 13M SML OVAL (MISCELLANEOUS) IMPLANT
SNARE SHORT THROW 30M LRG OVAL (MISCELLANEOUS) IMPLANT
SPOT EX ENDOSCOPIC TATTOO (MISCELLANEOUS)
SYR INFLATION 60ML (SYRINGE) ×3 IMPLANT
TRAP ETRAP POLY (MISCELLANEOUS) IMPLANT
VARIJECT INJECTOR VIN23 (MISCELLANEOUS)
WATER STERILE IRR 250ML POUR (IV SOLUTION) ×3 IMPLANT
WIRE CRE 18-20MM 8CM F G (MISCELLANEOUS) IMPLANT

## 2016-08-12 NOTE — Anesthesia Procedure Notes (Signed)
Procedure Name: MAC Performed by: Mayme Genta Pre-anesthesia Checklist: Patient identified, Emergency Drugs available, Suction available, Timeout performed and Patient being monitored Patient Re-evaluated:Patient Re-evaluated prior to inductionOxygen Delivery Method: Nasal cannula Placement Confirmation: positive ETCO2

## 2016-08-12 NOTE — H&P (Addendum)
Lucilla Lame, MD [address] [place], [place] [address] Phone:[phone] Fax : [phone]  Primary Care Physician:  Guadalupe Maple, MD Primary Gastroenterologist:  Dr. Allen Norris  Pre-Procedure History & Physical: HPI:  Christopher Henderson is a 71 y.o. male is here for an endoscopy.   Past Medical History:  Diagnosis Date  . Dental bridge present    also some implants  . Hypertension     Past Surgical History:  Procedure Laterality Date  . ESOPHAGEAL DILATION  06/23/2016   Procedure: ESOPHAGEAL DILATION;  Surgeon: Lucilla Lame, MD;  Location: [place] [facility];  Service: Endoscopy;;  . ESOPHAGOGASTRODUODENOSCOPY (EGD) WITH PROPOFOL N/A 06/23/2016   Procedure: ESOPHAGOGASTRODUODENOSCOPY (EGD) WITH PROPOFOL;  Surgeon: Lucilla Lame, MD;  Location: [facility];  Service: Endoscopy;  Laterality: N/A;  . EYE SURGERY  2014   cataract Jan and Feb with implants  . HAND / FINGER TENDON LESION EXCISION Left 2000  . I&D EXTREMITY Left 06/18/2012   Procedure: Irrigation and Debridement with wound exploration Left Hand and percutaneous pinning of left Fifth finger and third metacarpal;  Surgeon: Schuyler Amor, MD;  Location: [facility];  Service: Orthopedics;  Laterality: Left;  . KNEE SURGERY Left 1972    Prior to Admission medications   Medication Sig Start Date End Date Taking? Authorizing Provider  amLODipine (NORVASC) 10 MG tablet Take 1 tablet (10 mg total) by mouth daily. 05/23/16  Yes Crissman, Jeannette How, MD  Apoaequorin (PREVAGEN PO) Take by mouth.   Yes [provider]  Ascorbic Acid (VITAMIN C PO) Take by mouth.   Yes [provider]  aspirin 81 MG tablet Take 81 mg by mouth daily.   Yes [provider]  benazepril (LOTENSIN) 40 MG tablet Take 1 tablet (40 mg total) by mouth daily. 05/23/16  Yes Guadalupe Maple, MD  Cholecalciferol (VITAMIN D PO) Take by mouth.   Yes [provider]  dutasteride (AVODART) 0.5 MG capsule Take 0.5  mg by mouth every other day.   Yes [provider]  Omega-3 Fatty Acids (FISH OIL) 1000 MG CAPS Take 1,000 mg by mouth 2 (two) times daily.   Yes [provider]  sildenafil (VIAGRA) 100 MG tablet Take 100 mg by mouth daily as needed for erectile dysfunction.   Yes [provider]    Allergies as of 08/01/2016 - Review Complete 06/23/2016  Allergen Reaction Noted  . Penicillins Hives 06/18/2012    Family History  Problem Relation Age of Onset  . Hypertension Mother   . Diabetes Mother   . Thyroid disease Mother   . Glaucoma Father   . Cancer Sister        colon and breast  . Alzheimer's disease Sister   . Diabetes Brother   . Heart disease Brother   . Seizures Sister     Social History   Social History  . Marital status: Married    Spouse name: N/A  . Number of children: N/A  . Years of education: N/A   Occupational History  . Not on file.   Social History Main Topics  . Smoking status: Never Smoker  . Smokeless tobacco: Never Used  . Alcohol use No  . Drug use: No  . Sexual activity: Yes   Other Topics Concern  . Not on file   Social History Narrative  . No narrative on file    Review of Systems: See HPI, otherwise negative ROS  Physical Exam: BP 129/84  Pulse 66   Temp 97.7 F (36.5 C) (Temporal)   Resp 16   Ht 6' (1.829 m)   Wt 217 lb (98.4 kg)   SpO2 95%   BMI 29.43 kg/m  General:   Alert,  pleasant and cooperative in NAD Head:  Normocephalic and atraumatic. Neck:  Supple; no masses or thyromegaly. Lungs:  Clear throughout to auscultation.    Heart:  Regular rate and rhythm. Abdomen:  Soft, nontender and nondistended. Normal bowel sounds, without guarding, and without rebound.   Neurologic:  Alert and  oriented x4;  grossly normal neurologically.  Impression/Plan: Christopher Henderson is here for an endoscopy to be performed for dysphagia.  Risks, benefits, limitations, and alternatives regarding  colonoscopy have  been reviewed with the patient.  Questions have been answered.  All parties agreeable.   Lucilla Lame, MD  08/12/2016, 7:50 AM

## 2016-08-12 NOTE — Anesthesia Preprocedure Evaluation (Signed)
Anesthesia Evaluation  Patient identified by MRN, date of birth, ID band Patient awake    Reviewed: Allergy & Precautions, H&P , NPO status , Patient's Chart, lab work & pertinent test results  Airway Mallampati: II  TM Distance: >3 FB Neck ROM: full    Dental no notable dental hx.    Pulmonary    Pulmonary exam normal        Cardiovascular hypertension, Normal cardiovascular exam     Neuro/Psych    GI/Hepatic   Endo/Other    Renal/GU      Musculoskeletal   Abdominal   Peds  Hematology   Anesthesia Other Findings   Reproductive/Obstetrics                             Anesthesia Physical  Anesthesia Plan  ASA: II  Anesthesia Plan: MAC   Post-op Pain Management:    Induction:   Airway Management Planned:   Additional Equipment:   Intra-op Plan:   Post-operative Plan:   Informed Consent: I have reviewed the patients History and Physical, chart, labs and discussed the procedure including the risks, benefits and alternatives for the proposed anesthesia with the patient or authorized representative who has indicated his/her understanding and acceptance.     Plan Discussed with:   Anesthesia Plan Comments:         Anesthesia Quick Evaluation

## 2016-08-12 NOTE — Op Note (Signed)
[facility] Patient Name: Christopher Henderson Procedure Date: 08/12/2016 8:52 AM MRN: [id number] Account #: [id number] Date of Birth: [birth year] Admit Type: Outpatient Age: 71 Room: [facility] OR ROOM 01 Gender: Male Note Status: Finalized Procedure:            Upper GI endoscopy Indications:          Dysphagia Providers:            Lucilla Lame MD, MD Referring MD:         Guadalupe Maple, MD (Referring MD) Medicines:            Propofol per Anesthesia Complications:        No immediate complications. Procedure:            Pre-Anesthesia Assessment:                       - Prior to the procedure, a History and Physical was                        performed, and patient medications and allergies were                        reviewed. The patient's tolerance of previous                        anesthesia was also reviewed. The risks and benefits of                        the procedure and the sedation options and risks were                        discussed with the patient. All questions were                        answered, and informed consent was obtained. Prior                        Anticoagulants: The patient has taken no previous                        anticoagulant or antiplatelet agents. ASA Grade                        Assessment: II - A patient with mild systemic disease.                        After reviewing the risks and benefits, the patient was                        deemed in satisfactory condition to undergo the                        procedure.                       After obtaining informed consent, the endoscope was                        passed under direct vision. Throughout the procedure,  the patient's blood pressure, pulse, and oxygen                        saturations were monitored continuously. The Olympus                        [id number]-HQ190 Endoscope (S#. [id number]) was introduced                        through the  mouth, and advanced to the second part of                        duodenum. The upper GI endoscopy was accomplished                        without difficulty. The patient tolerated the procedure                        well. Findings:      A small hiatal hernia was present.      One moderate benign-appearing, intrinsic stenosis was found at the       gastroesophageal junction. This measured 1.5 cm (inner diameter) and was       traversed. A TTS dilator was passed through the scope. Dilation with a       15-16.5-18 mm balloon dilator was performed to 18 mm. The dilation site       was examined following endoscope reinsertion and showed complete       resolution of luminal narrowing.      The stomach was normal.      The examined duodenum was normal. Impression:           - Small hiatal hernia.                       - Benign-appearing esophageal stenosis. Dilated.                       - Normal stomach.                       - Normal examined duodenum.                       - No specimens collected. Recommendation:       - Discharge patient to home.                       - Resume previous diet.                       - Continue present medications.                       - Repeat upper endoscopy PRN for retreatment. Procedure Code(s):    --- Professional ---                       [phone], Esophagogastroduodenoscopy, flexible, transoral;                        with transendoscopic balloon dilation of esophagus                        (  less than 30 mm diameter) Diagnosis Code(s):    --- Professional ---                       R13.10, Dysphagia, unspecified                       K22.2, Esophageal obstruction CPT copyright 2016 American Medical Association. All rights reserved. The codes documented in this report are preliminary and upon coder review may  be revised to meet current compliance requirements. Lucilla Lame MD, MD 08/12/2016 9:17:01 AM This report has been signed  electronically. Number of Addenda: 0 Note Initiated On: 08/12/2016 8:52 AM      [facility]

## 2016-08-12 NOTE — Transfer of Care (Signed)
Immediate Anesthesia Transfer of Care Note  Patient: Christopher Henderson  Procedure(s) Performed: Procedure(s): ESOPHAGOGASTRODUODENOSCOPY (EGD) WITH PROPOFOL (N/A) ESOPHAGEAL DILATION  Patient Location: PACU  Anesthesia Type: MAC  Level of Consciousness: awake, alert  and patient cooperative  Airway and Oxygen Therapy: Patient Spontanous Breathing and Patient connected to supplemental oxygen  Post-op Assessment: Post-op Vital signs reviewed, Patient's Cardiovascular Status Stable, Respiratory Function Stable, Patent Airway and No signs of Nausea or vomiting  Post-op Vital Signs: Reviewed and stable  Complications: No apparent anesthesia complications  

## 2016-08-12 NOTE — Anesthesia Postprocedure Evaluation (Signed)
Anesthesia Post Note  Patient: Christopher Henderson  Procedure(s) Performed: Procedure(s) (LRB): ESOPHAGOGASTRODUODENOSCOPY (EGD) WITH PROPOFOL (N/A) ESOPHAGEAL DILATION  Patient location during evaluation: PACU Anesthesia Type: MAC Level of consciousness: awake and alert and oriented Pain management: satisfactory to patient Vital Signs Assessment: post-procedure vital signs reviewed and stable Respiratory status: spontaneous breathing, nonlabored ventilation and respiratory function stable Cardiovascular status: blood pressure returned to baseline and stable Postop Assessment: Adequate PO intake and No signs of nausea or vomiting Anesthetic complications: no    Courtez Twaddle J      

## 2016-08-15 ENCOUNTER — Encounter: Payer: Self-pay | Admitting: Gastroenterology

## 2016-08-15 DIAGNOSIS — N401 Enlarged prostate with lower urinary tract symptoms: Secondary | ICD-10-CM | POA: Diagnosis not present

## 2016-08-15 DIAGNOSIS — N5201 Erectile dysfunction due to arterial insufficiency: Secondary | ICD-10-CM | POA: Diagnosis not present

## 2016-08-15 DIAGNOSIS — R351 Nocturia: Secondary | ICD-10-CM | POA: Diagnosis not present

## 2016-10-03 DIAGNOSIS — R69 Illness, unspecified: Secondary | ICD-10-CM | POA: Diagnosis not present

## 2016-10-31 ENCOUNTER — Telehealth: Payer: Self-pay | Admitting: Family Medicine

## 2016-10-31 NOTE — Telephone Encounter (Signed)
Called pt to schedule for Annual Wellness Visit with Nurse Health Advisor, Tiffany Hill, my c/b # is [phone]  Kathryn Brown ° °

## 2016-11-09 DIAGNOSIS — L812 Freckles: Secondary | ICD-10-CM | POA: Diagnosis not present

## 2016-11-09 DIAGNOSIS — Z85828 Personal history of other malignant neoplasm of skin: Secondary | ICD-10-CM | POA: Diagnosis not present

## 2016-11-09 DIAGNOSIS — L720 Epidermal cyst: Secondary | ICD-10-CM | POA: Diagnosis not present

## 2016-11-09 DIAGNOSIS — I8393 Asymptomatic varicose veins of bilateral lower extremities: Secondary | ICD-10-CM | POA: Diagnosis not present

## 2016-11-09 DIAGNOSIS — L578 Other skin changes due to chronic exposure to nonionizing radiation: Secondary | ICD-10-CM | POA: Diagnosis not present

## 2016-11-09 DIAGNOSIS — D1800 Hemangioma unspecified site: Secondary | ICD-10-CM | POA: Diagnosis not present

## 2016-11-09 DIAGNOSIS — L821 Other seborrheic keratosis: Secondary | ICD-10-CM | POA: Diagnosis not present

## 2016-11-09 DIAGNOSIS — L719 Rosacea, unspecified: Secondary | ICD-10-CM | POA: Diagnosis not present

## 2016-11-23 ENCOUNTER — Ambulatory Visit: Payer: Medicare HMO

## 2016-12-19 ENCOUNTER — Ambulatory Visit (INDEPENDENT_AMBULATORY_CARE_PROVIDER_SITE_OTHER): Payer: Medicare HMO | Admitting: Family Medicine

## 2016-12-19 ENCOUNTER — Encounter: Payer: Self-pay | Admitting: Family Medicine

## 2016-12-19 VITALS — BP 125/81 | HR 79 | Ht 73.0 in | Wt 215.0 lb

## 2016-12-19 DIAGNOSIS — I10 Essential (primary) hypertension: Secondary | ICD-10-CM

## 2016-12-19 DIAGNOSIS — K222 Esophageal obstruction: Secondary | ICD-10-CM

## 2016-12-19 DIAGNOSIS — Z7189 Other specified counseling: Secondary | ICD-10-CM | POA: Diagnosis not present

## 2016-12-19 DIAGNOSIS — Z13220 Encounter for screening for lipoid disorders: Secondary | ICD-10-CM | POA: Diagnosis not present

## 2016-12-19 DIAGNOSIS — Z23 Encounter for immunization: Secondary | ICD-10-CM | POA: Diagnosis not present

## 2016-12-19 DIAGNOSIS — Z131 Encounter for screening for diabetes mellitus: Secondary | ICD-10-CM

## 2016-12-19 DIAGNOSIS — N401 Enlarged prostate with lower urinary tract symptoms: Secondary | ICD-10-CM | POA: Diagnosis not present

## 2016-12-19 DIAGNOSIS — Z0000 Encounter for general adult medical examination without abnormal findings: Secondary | ICD-10-CM | POA: Diagnosis not present

## 2016-12-19 DIAGNOSIS — Z1329 Encounter for screening for other suspected endocrine disorder: Secondary | ICD-10-CM | POA: Diagnosis not present

## 2016-12-19 DIAGNOSIS — N138 Other obstructive and reflux uropathy: Secondary | ICD-10-CM | POA: Diagnosis not present

## 2016-12-19 LAB — URINALYSIS, ROUTINE W REFLEX MICROSCOPIC
Bilirubin, UA: NEGATIVE
GLUCOSE, UA: NEGATIVE
Nitrite, UA: NEGATIVE
PH UA: 5.5 (ref 5.0–7.5)
RBC, UA: NEGATIVE
Specific Gravity, UA: 1.025 (ref 1.005–1.030)
Urobilinogen, Ur: 1 mg/dL (ref 0.2–1.0)

## 2016-12-19 LAB — MICROSCOPIC EXAMINATION

## 2016-12-19 MED ORDER — AMLODIPINE BESYLATE 10 MG PO TABS: 10.0000 mg | ORAL_TABLET | Freq: Every day | ORAL | 4 refills | Status: DC

## 2016-12-19 MED ORDER — BENAZEPRIL HCL 40 MG PO TABS: 40.0000 mg | ORAL_TABLET | Freq: Every day | ORAL | 4 refills | Status: DC

## 2016-12-19 NOTE — Assessment & Plan Note (Signed)
A voluntary discussion about advance care planning including the explanation and discussion of advance directives was extensively discussed  with the patient.  Explanation about the health care proxy and Living will was reviewed and packet with forms with explanation of how to fill them out was given.    

## 2016-12-19 NOTE — Assessment & Plan Note (Signed)
Symptoms controlled with medication doing well

## 2016-12-19 NOTE — Patient Instructions (Signed)

## 2016-12-19 NOTE — Assessment & Plan Note (Signed)
Result with esophageal dilation

## 2016-12-19 NOTE — Assessment & Plan Note (Signed)
The current medical regimen is effective;  continue present plan and medications.  

## 2016-12-19 NOTE — Progress Notes (Signed)
BP 125/81   Pulse 79   Ht 6' 1"  (1.854 m)   Wt 215 lb (97.5 kg)   SpO2 99%   BMI 28.37 kg/m    Subjective:    Patient ID: Christopher Henderson, male    DOB: [birth year], 71 y.o.   MRN: [id number]  HPI: Christopher Henderson is a 71 y.o. male  AWV  AWV metrics met. Patient follow-up for reflux doing well has had esophageal stretching procedure 2 and GI has patient on reflux medication doing well area Followed by urology for BPH and doing well had recent normal except for size prostate exam. Mucinous cyst on finger healing well without problems. Taking blood pressure medicine without problems or issues. Good control of blood pressure.  Relevant past medical, surgical, family and social history reviewed and updated as indicated. Interim medical history since our last visit reviewed. Allergies and medications reviewed and updated.  Review of Systems  Constitutional: Negative.   HENT: Negative.   Eyes: Negative.   Respiratory: Negative.   Cardiovascular: Negative.   Gastrointestinal: Negative.   Endocrine: Negative.   Genitourinary: Negative.   Musculoskeletal: Negative.   Skin: Negative.   Allergic/Immunologic: Negative.   Neurological: Negative.   Hematological: Negative.   Psychiatric/Behavioral: Negative.     Per HPI unless specifically indicated above     Objective:    BP 125/81   Pulse 79   Ht 6' 1"  (1.854 m)   Wt 215 lb (97.5 kg)   SpO2 99%   BMI 28.37 kg/m   Wt Readings from Last 3 Encounters:  12/19/16 215 lb (97.5 kg)  08/12/16 217 lb (98.4 kg)  06/23/16 218 lb (98.9 kg)    Physical Exam  Constitutional: He is oriented to person, place, and time. He appears well-developed and well-nourished.  HENT:  Head: Normocephalic.  Right Ear: External ear normal.  Left Ear: External ear normal.  Nose: Nose normal.  Eyes: Pupils are equal, round, and reactive to light. Conjunctivae and EOM are normal.  Neck: Normal range of motion. Neck supple. No thyromegaly  present.  Cardiovascular: Normal rate, regular rhythm, normal heart sounds and intact distal pulses.   Pulmonary/Chest: Effort normal and breath sounds normal.  Abdominal: Soft. Bowel sounds are normal. There is no splenomegaly or hepatomegaly.  Genitourinary:  Genitourinary Comments: Done at urology  Musculoskeletal: Normal range of motion.  Lymphadenopathy:    He has no cervical adenopathy.  Neurological: He is alert and oriented to person, place, and time. He has normal reflexes.  Skin: Skin is warm and dry.  Psychiatric: He has a normal mood and affect. His behavior is normal. Judgment and thought content normal.    Results for orders placed or performed in visit on 40/98/11  Basic metabolic panel  Result Value Ref Range   Glucose 121 (H) 65 - 99 mg/dL   BUN 8 8 - 27 mg/dL   Creatinine, Ser 0.98 0.76 - 1.27 mg/dL   GFR calc non Af Amer 78 >59 mL/min/1.73   GFR calc Af Amer 91 >59 mL/min/1.73   BUN/Creatinine Ratio 8 (L) 10 - 24   Sodium 142 134 - 144 mmol/L   Potassium 5.1 3.5 - 5.2 mmol/L   Chloride 102 96 - 106 mmol/L   CO2 23 18 - 29 mmol/L   Calcium 9.6 8.6 - 10.2 mg/dL      Assessment & Plan:   Problem List Items Addressed This Visit      Cardiovascular and Mediastinum   Essential  hypertension - Primary    The current medical regimen is effective;  continue present plan and medications.       Relevant Medications   benazepril (LOTENSIN) 40 MG tablet   amLODipine (NORVASC) 10 MG tablet   Other Relevant Orders   CBC with Differential/Platelet     Digestive   Stricture and stenosis of esophagus    Result with esophageal dilation        Genitourinary   BPH with obstruction/lower urinary tract symptoms    Symptoms controlled with medication doing well        Other   Advanced care planning/counseling discussion    A voluntary discussion about advance care planning including the explanation and discussion of advance directives was extensively discussed   with the patient.  Explanation about the health care proxy and Living will was reviewed and packet with forms with explanation of how to fill them out was given.          Well adult exam    Other Visit Diagnoses    Screening for diabetes mellitus (DM)       Relevant Orders   Comprehensive metabolic panel   Urinalysis, Routine w reflex microscopic   Screening cholesterol level       Relevant Orders   Lipid panel   Thyroid disorder screen       Relevant Orders   TSH   Needs flu shot       Relevant Orders   Flu vaccine HIGH DOSE PF (Fluzone High dose) (Completed)       Follow up plan: Return in about 6 months (around 06/18/2017) for BMP.

## 2016-12-20 ENCOUNTER — Encounter: Payer: Self-pay | Admitting: Family Medicine

## 2016-12-20 LAB — COMPREHENSIVE METABOLIC PANEL
ALT: 19 IU/L (ref 0–44)
AST: 26 IU/L (ref 0–40)
Albumin/Globulin Ratio: 1.8 (ref 1.2–2.2)
Albumin: 4.8 g/dL (ref 3.5–4.8)
Alkaline Phosphatase: 55 IU/L (ref 39–117)
BUN/Creatinine Ratio: 12 (ref 10–24)
BUN: 16 mg/dL (ref 8–27)
Bilirubin Total: 0.7 mg/dL (ref 0.0–1.2)
CO2: 24 mmol/L (ref 20–29)
Calcium: 9.6 mg/dL (ref 8.6–10.2)
Chloride: 102 mmol/L (ref 96–106)
Creatinine, Ser: 1.29 mg/dL — ABNORMAL HIGH (ref 0.76–1.27)
GFR calc Af Amer: 65 mL/min/{1.73_m2} (ref >59)
GFR calc non Af Amer: 56 mL/min/{1.73_m2} — ABNORMAL LOW (ref >59)
Globulin, Total: 2.6 g/dL (ref 1.5–4.5)
Glucose: 108 mg/dL — ABNORMAL HIGH (ref 65–99)
Potassium: 4.1 mmol/L (ref 3.5–5.2)
Sodium: 142 mmol/L (ref 134–144)
Total Protein: 7.4 g/dL (ref 6.0–8.5)

## 2016-12-20 LAB — LIPID PANEL
Chol/HDL Ratio: 4.8 ratio (ref 0.0–5.0)
Cholesterol, Total: 198 mg/dL (ref 100–199)
HDL: 41 mg/dL (ref >39)
LDL CALC: 131 mg/dL — AB (ref 0–99)
Triglycerides: 130 mg/dL (ref 0–149)
VLDL Cholesterol Cal: 26 mg/dL (ref 5–40)

## 2016-12-20 LAB — CBC WITH DIFFERENTIAL/PLATELET
BASOS: 0 %
Basophils Absolute: 0 10*3/uL (ref 0.0–0.2)
EOS (ABSOLUTE): 0.3 10*3/uL (ref 0.0–0.4)
EOS: 5 %
HEMATOCRIT: 47.2 % (ref 37.5–51.0)
HEMOGLOBIN: 15.4 g/dL (ref 13.0–17.7)
Immature Grans (Abs): 0 10*3/uL (ref 0.0–0.1)
Immature Granulocytes: 0 %
LYMPHS ABS: 1.5 10*3/uL (ref 0.7–3.1)
Lymphs: 31 %
MCH: 30.1 pg (ref 26.6–33.0)
MCHC: 32.6 g/dL (ref 31.5–35.7)
MCV: 92 fL (ref 79–97)
MONOCYTES: 8 %
Monocytes Absolute: 0.4 10*3/uL (ref 0.1–0.9)
NEUTROS ABS: 2.7 10*3/uL (ref 1.4–7.0)
Neutrophils: 56 %
Platelets: 206 10*3/uL (ref 150–379)
RBC: 5.11 x10E6/uL (ref 4.14–5.80)
RDW: 13 % (ref 12.3–15.4)
WBC: 4.9 10*3/uL (ref 3.4–10.8)

## 2016-12-20 LAB — TSH: TSH: 2.73 u[IU]/mL (ref 0.450–4.500)

## 2017-05-30 DIAGNOSIS — R69 Illness, unspecified: Secondary | ICD-10-CM | POA: Diagnosis not present

## 2017-06-08 DIAGNOSIS — Z85828 Personal history of other malignant neoplasm of skin: Secondary | ICD-10-CM | POA: Diagnosis not present

## 2017-06-08 DIAGNOSIS — Z8249 Family history of ischemic heart disease and other diseases of the circulatory system: Secondary | ICD-10-CM | POA: Diagnosis not present

## 2017-06-08 DIAGNOSIS — Z809 Family history of malignant neoplasm, unspecified: Secondary | ICD-10-CM | POA: Diagnosis not present

## 2017-06-08 DIAGNOSIS — N400 Enlarged prostate without lower urinary tract symptoms: Secondary | ICD-10-CM | POA: Diagnosis not present

## 2017-06-08 DIAGNOSIS — K219 Gastro-esophageal reflux disease without esophagitis: Secondary | ICD-10-CM | POA: Diagnosis not present

## 2017-06-08 DIAGNOSIS — I10 Essential (primary) hypertension: Secondary | ICD-10-CM | POA: Diagnosis not present

## 2017-06-08 DIAGNOSIS — Z823 Family history of stroke: Secondary | ICD-10-CM | POA: Diagnosis not present

## 2017-06-15 DIAGNOSIS — H52223 Regular astigmatism, bilateral: Secondary | ICD-10-CM | POA: Diagnosis not present

## 2017-06-15 DIAGNOSIS — H40023 Open angle with borderline findings, high risk, bilateral: Secondary | ICD-10-CM | POA: Diagnosis not present

## 2017-06-19 ENCOUNTER — Encounter: Payer: Self-pay | Admitting: Family Medicine

## 2017-06-19 ENCOUNTER — Ambulatory Visit (INDEPENDENT_AMBULATORY_CARE_PROVIDER_SITE_OTHER): Payer: Medicare HMO | Admitting: Family Medicine

## 2017-06-19 VITALS — BP 119/80 | HR 75 | Temp 98.1°F | Wt 201.3 lb

## 2017-06-19 DIAGNOSIS — K222 Esophageal obstruction: Secondary | ICD-10-CM | POA: Diagnosis not present

## 2017-06-19 DIAGNOSIS — N138 Other obstructive and reflux uropathy: Secondary | ICD-10-CM | POA: Diagnosis not present

## 2017-06-19 DIAGNOSIS — I10 Essential (primary) hypertension: Secondary | ICD-10-CM

## 2017-06-19 DIAGNOSIS — Z1159 Encounter for screening for other viral diseases: Secondary | ICD-10-CM

## 2017-06-19 DIAGNOSIS — N401 Enlarged prostate with lower urinary tract symptoms: Secondary | ICD-10-CM

## 2017-06-19 NOTE — Assessment & Plan Note (Signed)
The current medical regimen is effective;  continue present plan and medications.  

## 2017-06-19 NOTE — Progress Notes (Signed)
BP 119/80 (BP Location: Left Arm, Patient Position: Sitting, Cuff Size: Normal)   Pulse 75   Temp 98.1 F (36.7 C) (Oral)   Wt 201 lb 5 oz (91.3 kg)   SpO2 99%   BMI 26.56 kg/m    Subjective:    Patient ID: Christopher Henderson, male    DOB: 02/04/1946, 72 y.o.   MRN: [id number]  HPI: Christopher Henderson is a 72 y.o. male  Chief Complaint  Patient presents with  . Hypertension    follow-up  Follow-up hypertension doing well no complaints from medications taken faithfully with good control. Reflux followed by GI and stable taking Protonix without issues. BPH also stable followed by urology has appointment next month and doing well with medications no issues.   Relevant past medical, surgical, family and social history reviewed and updated as indicated. Interim medical history since our last visit reviewed. Allergies and medications reviewed and updated.  Review of Systems  Constitutional: Negative.   Respiratory: Negative.   Cardiovascular: Negative.     Per HPI unless specifically indicated above     Objective:    BP 119/80 (BP Location: Left Arm, Patient Position: Sitting, Cuff Size: Normal)   Pulse 75   Temp 98.1 F (36.7 C) (Oral)   Wt 201 lb 5 oz (91.3 kg)   SpO2 99%   BMI 26.56 kg/m   Wt Readings from Last 3 Encounters:  06/19/17 201 lb 5 oz (91.3 kg)  12/19/16 215 lb (97.5 kg)  08/12/16 217 lb (98.4 kg)    Physical Exam  Constitutional: He is oriented to person, place, and time. He appears well-developed and well-nourished.  HENT:  Head: Normocephalic and atraumatic.  Eyes: Conjunctivae and EOM are normal.  Neck: Normal range of motion.  Cardiovascular: Normal rate, regular rhythm and normal heart sounds.  Pulmonary/Chest: Effort normal and breath sounds normal.  Musculoskeletal: Normal range of motion.  Neurological: He is alert and oriented to person, place, and time.  Skin: No erythema.  Psychiatric: He has a normal mood and affect. His behavior is  normal. Judgment and thought content normal.    Results for orders placed or performed in visit on 12/19/16  Microscopic Examination  Result Value Ref Range   WBC, UA 0-5 0 - 5 /hpf   RBC, UA 0-2 0 - 2 /hpf   Epithelial Cells (non renal) 0-10 0 - 10 /hpf   Casts Present (A) None seen /lpf   Cast Type White cell casts (A) N/A   Mucus, UA Present (A) Not Estab.   Bacteria, UA Few None seen/Few  CBC with Differential/Platelet  Result Value Ref Range   WBC 4.9 3.4 - 10.8 x10E3/uL   RBC 5.11 4.14 - 5.80 x10E6/uL   Hemoglobin 15.4 13.0 - 17.7 g/dL   Hematocrit 47.2 37.5 - 51.0 %   MCV 92 79 - 97 fL   MCH 30.1 26.6 - 33.0 pg   MCHC 32.6 31.5 - 35.7 g/dL   RDW 13.0 12.3 - 15.4 %   Platelets 206 150 - 379 x10E3/uL   Neutrophils 56 Not Estab. %   Lymphs 31 Not Estab. %   Monocytes 8 Not Estab. %   Eos 5 Not Estab. %   Basos 0 Not Estab. %   Neutrophils Absolute 2.7 1.4 - 7.0 x10E3/uL   Lymphocytes Absolute 1.5 0.7 - 3.1 x10E3/uL   Monocytes Absolute 0.4 0.1 - 0.9 x10E3/uL   EOS (ABSOLUTE) 0.3 0.0 - 0.4 x10E3/uL   Basophils  Absolute 0.0 0.0 - 0.2 x10E3/uL   Immature Granulocytes 0 Not Estab. %   Immature Grans (Abs) 0.0 0.0 - 0.1 x10E3/uL  Comprehensive metabolic panel  Result Value Ref Range   Glucose 108 (H) 65 - 99 mg/dL   BUN 16 8 - 27 mg/dL   Creatinine, Ser 1.29 (H) 0.76 - 1.27 mg/dL   GFR calc non Af Amer 56 (L) >59 mL/min/1.73   GFR calc Af Amer 65 >59 mL/min/1.73   BUN/Creatinine Ratio 12 10 - 24   Sodium 142 134 - 144 mmol/L   Potassium 4.1 3.5 - 5.2 mmol/L   Chloride 102 96 - 106 mmol/L   CO2 24 20 - 29 mmol/L   Calcium 9.6 8.6 - 10.2 mg/dL   Total Protein 7.4 6.0 - 8.5 g/dL   Albumin 4.8 3.5 - 4.8 g/dL   Globulin, Total 2.6 1.5 - 4.5 g/dL   Albumin/Globulin Ratio 1.8 1.2 - 2.2   Bilirubin Total 0.7 0.0 - 1.2 mg/dL   Alkaline Phosphatase 55 39 - 117 IU/L   AST 26 0 - 40 IU/L   ALT 19 0 - 44 IU/L  Lipid panel  Result Value Ref Range   Cholesterol, Total 198  100 - 199 mg/dL   Triglycerides 130 0 - 149 mg/dL   HDL 41 >39 mg/dL   VLDL Cholesterol Cal 26 5 - 40 mg/dL   LDL Calculated 131 (H) 0 - 99 mg/dL   Chol/HDL Ratio 4.8 0.0 - 5.0 ratio  TSH  Result Value Ref Range   TSH 2.730 0.450 - 4.500 uIU/mL  Urinalysis, Routine w reflex microscopic  Result Value Ref Range   Specific Gravity, UA 1.025 1.005 - 1.030   pH, UA 5.5 5.0 - 7.5   Color, UA Orange Yellow   Appearance Ur Cloudy (A) Clear   Leukocytes, UA Trace (A) Negative   Protein, UA 1+ (A) Negative/Trace   Glucose, UA Negative Negative   Ketones, UA 1+ (A) Negative   RBC, UA Negative Negative   Bilirubin, UA Negative Negative   Urobilinogen, Ur 1.0 0.2 - 1.0 mg/dL   Nitrite, UA Negative Negative   Microscopic Examination See below:       Assessment & Plan:   Problem List Items Addressed This Visit      Cardiovascular and Mediastinum   Essential hypertension - Primary    The current medical regimen is effective;  continue present plan and medications.       Relevant Orders   Basic metabolic panel     Digestive   Stricture and stenosis of esophagus    The current medical regimen is effective;  continue present plan and medications.         Genitourinary   BPH with obstruction/lower urinary tract symptoms    The current medical regimen is effective;  continue present plan and medications.        Other Visit Diagnoses    Need for hepatitis C screening test       Relevant Orders   Hepatitis C Antibody       Follow up plan: Return in about 6 months (around 12/20/2017) for Physical Exam.

## 2017-06-20 ENCOUNTER — Encounter: Payer: Self-pay | Admitting: Family Medicine

## 2017-06-20 LAB — BASIC METABOLIC PANEL
BUN/Creatinine Ratio: 9 — ABNORMAL LOW (ref 10–24)
BUN: 8 mg/dL (ref 8–27)
CHLORIDE: 102 mmol/L (ref 96–106)
CO2: 23 mmol/L (ref 20–29)
CREATININE: 0.94 mg/dL (ref 0.76–1.27)
Calcium: 9.5 mg/dL (ref 8.6–10.2)
GFR calc Af Amer: 95 mL/min/{1.73_m2} (ref >59)
GFR calc non Af Amer: 82 mL/min/{1.73_m2} (ref >59)
GLUCOSE: 112 mg/dL — AB (ref 65–99)
POTASSIUM: 4.3 mmol/L (ref 3.5–5.2)
SODIUM: 142 mmol/L (ref 134–144)

## 2017-06-20 LAB — HEPATITIS C ANTIBODY: HEP C VIRUS AB: 0.1 {s_co_ratio} (ref 0.0–0.9)

## 2017-06-30 ENCOUNTER — Other Ambulatory Visit: Payer: Self-pay

## 2017-06-30 MED ORDER — PANTOPRAZOLE SODIUM 40 MG PO TBEC: 40.0000 mg | DELAYED_RELEASE_TABLET | Freq: Every day | ORAL | 3 refills | Status: DC

## 2017-08-16 DIAGNOSIS — R351 Nocturia: Secondary | ICD-10-CM | POA: Diagnosis not present

## 2017-08-16 DIAGNOSIS — R972 Elevated prostate specific antigen [PSA]: Secondary | ICD-10-CM | POA: Diagnosis not present

## 2017-08-16 DIAGNOSIS — N5201 Erectile dysfunction due to arterial insufficiency: Secondary | ICD-10-CM | POA: Diagnosis not present

## 2017-08-16 DIAGNOSIS — N401 Enlarged prostate with lower urinary tract symptoms: Secondary | ICD-10-CM | POA: Diagnosis not present

## 2017-09-12 DIAGNOSIS — H40023 Open angle with borderline findings, high risk, bilateral: Secondary | ICD-10-CM | POA: Diagnosis not present

## 2017-11-09 DIAGNOSIS — Z85828 Personal history of other malignant neoplasm of skin: Secondary | ICD-10-CM | POA: Diagnosis not present

## 2017-11-09 DIAGNOSIS — L578 Other skin changes due to chronic exposure to nonionizing radiation: Secondary | ICD-10-CM | POA: Diagnosis not present

## 2017-11-09 DIAGNOSIS — L720 Epidermal cyst: Secondary | ICD-10-CM | POA: Diagnosis not present

## 2017-11-09 DIAGNOSIS — L918 Other hypertrophic disorders of the skin: Secondary | ICD-10-CM | POA: Diagnosis not present

## 2017-11-09 DIAGNOSIS — D225 Melanocytic nevi of trunk: Secondary | ICD-10-CM | POA: Diagnosis not present

## 2017-11-09 DIAGNOSIS — L821 Other seborrheic keratosis: Secondary | ICD-10-CM | POA: Diagnosis not present

## 2017-11-09 DIAGNOSIS — Z1283 Encounter for screening for malignant neoplasm of skin: Secondary | ICD-10-CM | POA: Diagnosis not present

## 2017-12-04 DIAGNOSIS — R69 Illness, unspecified: Secondary | ICD-10-CM | POA: Diagnosis not present

## 2017-12-28 DIAGNOSIS — R69 Illness, unspecified: Secondary | ICD-10-CM | POA: Diagnosis not present

## 2018-01-09 ENCOUNTER — Ambulatory Visit (INDEPENDENT_AMBULATORY_CARE_PROVIDER_SITE_OTHER): Payer: Medicare HMO | Admitting: Family Medicine

## 2018-01-09 ENCOUNTER — Encounter: Payer: Self-pay | Admitting: Family Medicine

## 2018-01-09 DIAGNOSIS — K222 Esophageal obstruction: Secondary | ICD-10-CM | POA: Diagnosis not present

## 2018-01-09 DIAGNOSIS — Z7189 Other specified counseling: Secondary | ICD-10-CM | POA: Diagnosis not present

## 2018-01-09 DIAGNOSIS — Z0000 Encounter for general adult medical examination without abnormal findings: Secondary | ICD-10-CM | POA: Diagnosis not present

## 2018-01-09 DIAGNOSIS — D692 Other nonthrombocytopenic purpura: Secondary | ICD-10-CM | POA: Diagnosis not present

## 2018-01-09 DIAGNOSIS — I10 Essential (primary) hypertension: Secondary | ICD-10-CM

## 2018-01-09 HISTORY — DX: Other nonthrombocytopenic purpura: D69.2

## 2018-01-09 LAB — MICROSCOPIC EXAMINATION: Bacteria, UA: NONE SEEN

## 2018-01-09 LAB — URINALYSIS, ROUTINE W REFLEX MICROSCOPIC
Bilirubin, UA: NEGATIVE
Glucose, UA: NEGATIVE
NITRITE UA: NEGATIVE
RBC UA: NEGATIVE
SPEC GRAV UA: 1.02 (ref 1.005–1.030)
UUROB: 1 mg/dL (ref 0.2–1.0)
pH, UA: 6 (ref 5.0–7.5)

## 2018-01-09 MED ORDER — AMLODIPINE BESYLATE 10 MG PO TABS: 10.0000 mg | ORAL_TABLET | Freq: Every day | ORAL | 4 refills | Status: DC

## 2018-01-09 MED ORDER — BENAZEPRIL HCL 40 MG PO TABS: 40.0000 mg | ORAL_TABLET | Freq: Every day | ORAL | 4 refills | Status: DC

## 2018-01-09 MED ORDER — PANTOPRAZOLE SODIUM 40 MG PO TBEC: 40.0000 mg | DELAYED_RELEASE_TABLET | Freq: Every day | ORAL | 4 refills | Status: DC

## 2018-01-09 NOTE — Assessment & Plan Note (Signed)
The current medical regimen is effective;  continue present plan and medications.  

## 2018-01-09 NOTE — Assessment & Plan Note (Signed)
Stable on arms 

## 2018-01-09 NOTE — Progress Notes (Signed)
BP 122/83   Pulse 81   Temp (!) 97 F (36.1 C) (Oral)   Ht 5' 11.1" (1.806 m)   Wt 215 lb (97.5 kg)   SpO2 96%   BMI 29.90 kg/m    Subjective:    Patient ID: Christopher Henderson, male    DOB: [birth year], 72 y.o.   MRN: [id number]  HPI: Christopher Henderson is a 72 y.o. male  Chief Complaint  Patient presents with  . Annual Exam  Patient all in all doing well no complaints taking blood pressure medicine without problems and good control of blood pressure. Followed by Dr. Eliberto Ivory urology for Avodart and PSA checks. Reflux stable on Protonix. And takes vitamin supplements without problems.  Relevant past medical, surgical, family and social history reviewed and updated as indicated. Interim medical history since our last visit reviewed. Allergies and medications reviewed and updated.  Review of Systems  Constitutional: Negative.   HENT: Negative.   Eyes: Negative.   Respiratory: Negative.   Cardiovascular: Negative.   Gastrointestinal: Negative.   Endocrine: Negative.   Genitourinary: Negative.   Musculoskeletal: Negative.   Skin: Negative.   Allergic/Immunologic: Negative.   Neurological: Negative.   Hematological: Negative.   Psychiatric/Behavioral: Negative.     Per HPI unless specifically indicated above     Objective:    BP 122/83   Pulse 81   Temp (!) 97 F (36.1 C) (Oral)   Ht 5' 11.1" (1.806 m)   Wt 215 lb (97.5 kg)   SpO2 96%   BMI 29.90 kg/m   Wt Readings from Last 3 Encounters:  01/09/18 215 lb (97.5 kg)  06/19/17 201 lb 5 oz (91.3 kg)  12/19/16 215 lb (97.5 kg)    Physical Exam  Constitutional: He is oriented to person, place, and time. He appears well-developed and well-nourished.  HENT:  Head: Normocephalic.  Right Ear: External ear normal.  Left Ear: External ear normal.  Nose: Nose normal.  Eyes: Pupils are equal, round, and reactive to light. Conjunctivae and EOM are normal.  Neck: Normal range of motion. Neck supple. No thyromegaly present.   Cardiovascular: Normal rate, regular rhythm, normal heart sounds and intact distal pulses.  Pulmonary/Chest: Effort normal and breath sounds normal.  Abdominal: Soft. Bowel sounds are normal. There is no splenomegaly or hepatomegaly.  Genitourinary:  Genitourinary Comments: Followed by urology  Musculoskeletal: Normal range of motion.  Lymphadenopathy:    He has no cervical adenopathy.  Neurological: He is alert and oriented to person, place, and time. He has normal reflexes.  Skin: Skin is warm and dry.  Senile purpura changes arms  Psychiatric: He has a normal mood and affect. His behavior is normal. Judgment and thought content normal.    Results for orders placed or performed in visit on 67/34/19  Basic metabolic panel  Result Value Ref Range   Glucose 112 (H) 65 - 99 mg/dL   BUN 8 8 - 27 mg/dL   Creatinine, Ser 0.94 0.76 - 1.27 mg/dL   GFR calc non Af Amer 82 >59 mL/min/1.73   GFR calc Af Amer 95 >59 mL/min/1.73   BUN/Creatinine Ratio 9 (L) 10 - 24   Sodium 142 134 - 144 mmol/L   Potassium 4.3 3.5 - 5.2 mmol/L   Chloride 102 96 - 106 mmol/L   CO2 23 20 - 29 mmol/L   Calcium 9.5 8.6 - 10.2 mg/dL  Hepatitis C Antibody  Result Value Ref Range   Hep C Virus Ab 0.1  0.0 - 0.9 s/co ratio      Assessment & Plan:   Problem List Items Addressed This Visit      Cardiovascular and Mediastinum   Essential hypertension    The current medical regimen is effective;  continue present plan and medications.       Senile purpura (HCC)    Stable on arms        Digestive   Stricture and stenosis of esophagus    The current medical regimen is effective;  continue present plan and medications.         Other   Advanced care planning/counseling discussion    A voluntary discussion about advanced care planning including explanation and discussion of advanced directives was extentively discussed with the patient.  Explained about the healthcare proxy and living will was reviewed  and packet with forms with expiration of how to fill them out was given.  Time spent: Encounter 16+ min individuals present: Patient          Follow up plan: Return in about 6 months (around 07/11/2018) for BMP.

## 2018-01-09 NOTE — Assessment & Plan Note (Signed)
A voluntary discussion about advanced care planning including explanation and discussion of advanced directives was extentively discussed with the patient.  Explained about the healthcare proxy and living will was reviewed and packet with forms with expiration of how to fill them out was given.  Time spent: Encounter 16+ min individuals present: Patient 

## 2018-01-10 ENCOUNTER — Encounter: Payer: Self-pay | Admitting: Family Medicine

## 2018-01-10 LAB — TSH: TSH: 2.45 u[IU]/mL (ref 0.450–4.500)

## 2018-01-10 LAB — COMPREHENSIVE METABOLIC PANEL
A/G RATIO: 1.8 (ref 1.2–2.2)
ALBUMIN: 4.6 g/dL (ref 3.5–4.8)
ALK PHOS: 61 IU/L (ref 39–117)
ALT: 17 IU/L (ref 0–44)
AST: 20 IU/L (ref 0–40)
BILIRUBIN TOTAL: 0.6 mg/dL (ref 0.0–1.2)
BUN / CREAT RATIO: 11 (ref 10–24)
BUN: 11 mg/dL (ref 8–27)
CO2: 23 mmol/L (ref 20–29)
Calcium: 9.7 mg/dL (ref 8.6–10.2)
Chloride: 100 mmol/L (ref 96–106)
Creatinine, Ser: 1.02 mg/dL (ref 0.76–1.27)
GFR calc non Af Amer: 74 mL/min/{1.73_m2} (ref >59)
GFR, EST AFRICAN AMERICAN: 85 mL/min/{1.73_m2} (ref >59)
GLUCOSE: 105 mg/dL — AB (ref 65–99)
Globulin, Total: 2.5 g/dL (ref 1.5–4.5)
POTASSIUM: 4.3 mmol/L (ref 3.5–5.2)
SODIUM: 143 mmol/L (ref 134–144)
Total Protein: 7.1 g/dL (ref 6.0–8.5)

## 2018-01-10 LAB — LIPID PANEL
CHOLESTEROL TOTAL: 174 mg/dL (ref 100–199)
Chol/HDL Ratio: 4.8 ratio (ref 0.0–5.0)
HDL: 36 mg/dL — ABNORMAL LOW (ref >39)
LDL Calculated: 102 mg/dL — ABNORMAL HIGH (ref 0–99)
Triglycerides: 179 mg/dL — ABNORMAL HIGH (ref 0–149)
VLDL Cholesterol Cal: 36 mg/dL (ref 5–40)

## 2018-01-10 LAB — CBC WITH DIFFERENTIAL/PLATELET
BASOS: 1 %
Basophils Absolute: 0 10*3/uL (ref 0.0–0.2)
EOS (ABSOLUTE): 0.4 10*3/uL (ref 0.0–0.4)
EOS: 6 %
HEMATOCRIT: 45 % (ref 37.5–51.0)
Hemoglobin: 15.1 g/dL (ref 13.0–17.7)
IMMATURE GRANS (ABS): 0 10*3/uL (ref 0.0–0.1)
IMMATURE GRANULOCYTES: 0 %
LYMPHS: 26 %
Lymphocytes Absolute: 1.6 10*3/uL (ref 0.7–3.1)
MCH: 30.8 pg (ref 26.6–33.0)
MCHC: 33.6 g/dL (ref 31.5–35.7)
MCV: 92 fL (ref 79–97)
MONOCYTES: 8 %
Monocytes Absolute: 0.5 10*3/uL (ref 0.1–0.9)
NEUTROS PCT: 59 %
Neutrophils Absolute: 3.7 10*3/uL (ref 1.4–7.0)
PLATELETS: 216 10*3/uL (ref 150–450)
RBC: 4.91 x10E6/uL (ref 4.14–5.80)
RDW: 12.1 % — AB (ref 12.3–15.4)
WBC: 6.2 10*3/uL (ref 3.4–10.8)

## 2018-06-11 DIAGNOSIS — R69 Illness, unspecified: Secondary | ICD-10-CM | POA: Diagnosis not present

## 2018-06-18 DIAGNOSIS — H524 Presbyopia: Secondary | ICD-10-CM | POA: Diagnosis not present

## 2018-07-10 ENCOUNTER — Ambulatory Visit (INDEPENDENT_AMBULATORY_CARE_PROVIDER_SITE_OTHER): Payer: Medicare HMO | Admitting: Family Medicine

## 2018-07-10 ENCOUNTER — Encounter: Payer: Self-pay | Admitting: Family Medicine

## 2018-07-10 ENCOUNTER — Other Ambulatory Visit: Payer: Self-pay

## 2018-07-10 DIAGNOSIS — N401 Enlarged prostate with lower urinary tract symptoms: Secondary | ICD-10-CM

## 2018-07-10 DIAGNOSIS — K2100 Gastro-esophageal reflux disease with esophagitis, without bleeding: Secondary | ICD-10-CM

## 2018-07-10 DIAGNOSIS — D692 Other nonthrombocytopenic purpura: Secondary | ICD-10-CM

## 2018-07-10 DIAGNOSIS — N138 Other obstructive and reflux uropathy: Secondary | ICD-10-CM | POA: Diagnosis not present

## 2018-07-10 DIAGNOSIS — I10 Essential (primary) hypertension: Secondary | ICD-10-CM | POA: Diagnosis not present

## 2018-07-10 NOTE — Assessment & Plan Note (Signed)
No change 

## 2018-07-10 NOTE — Progress Notes (Signed)
BP 120/70    Subjective:    Patient ID: Christopher Henderson, male    DOB: 1946/01/14, 73 y.o.   MRN: [id number]  HPI: Christopher Henderson is a 73 y.o. male  BP check Med check  Telemedicine using audio/video telecommunications for a synchronous communication visit. Today's visit due to COVID-19 isolation precautions I connected with and verified that I am speaking with the correct person using two identifiers.   I discussed the limitations, risks, security and privacy concerns of performing an evaluation and management service by telecommunication and the availability of in person appointments. I also discussed with the patient that there may be a patient responsible charge related to this service. The patient expressed understanding and agreed to proceed. The patient's location is home. I am at home. Relevant past medical, surgical, family and social history reviewed and updated as indicated. Interim medical history since our last visit reviewed. Allergies and medications reviewed and updated.  Review of Systems  Constitutional: Negative.   Respiratory: Negative.   Cardiovascular: Negative.     Per HPI unless specifically indicated above     Objective:    BP 120/70   Wt Readings from Last 3 Encounters:  01/09/18 215 lb (97.5 kg)  06/19/17 201 lb 5 oz (91.3 kg)  12/19/16 215 lb (97.5 kg)    Physical Exam  Results for orders placed or performed in visit on 01/09/18  Microscopic Examination  Result Value Ref Range   WBC, UA 0-5 0 - 5 /hpf   RBC, UA 0-2 0 - 2 /hpf   Epithelial Cells (non renal) 0-10 0 - 10 /hpf   Mucus, UA Present Not Estab.   Bacteria, UA None seen None seen/Few  Comprehensive metabolic panel  Result Value Ref Range   Glucose 105 (H) 65 - 99 mg/dL   BUN 11 8 - 27 mg/dL   Creatinine, Ser 1.02 0.76 - 1.27 mg/dL   GFR calc non Af Amer 74 >59 mL/min/1.73   GFR calc Af Amer 85 >59 mL/min/1.73   BUN/Creatinine Ratio 11 10 - 24   Sodium 143 134 - 144 mmol/L    Potassium 4.3 3.5 - 5.2 mmol/L   Chloride 100 96 - 106 mmol/L   CO2 23 20 - 29 mmol/L   Calcium 9.7 8.6 - 10.2 mg/dL   Total Protein 7.1 6.0 - 8.5 g/dL   Albumin 4.6 3.5 - 4.8 g/dL   Globulin, Total 2.5 1.5 - 4.5 g/dL   Albumin/Globulin Ratio 1.8 1.2 - 2.2   Bilirubin Total 0.6 0.0 - 1.2 mg/dL   Alkaline Phosphatase 61 39 - 117 IU/L   AST 20 0 - 40 IU/L   ALT 17 0 - 44 IU/L  Lipid panel  Result Value Ref Range   Cholesterol, Total 174 100 - 199 mg/dL   Triglycerides 179 (H) 0 - 149 mg/dL   HDL 36 (L) >39 mg/dL   VLDL Cholesterol Cal 36 5 - 40 mg/dL   LDL Calculated 102 (H) 0 - 99 mg/dL   Chol/HDL Ratio 4.8 0.0 - 5.0 ratio  CBC with Differential/Platelet  Result Value Ref Range   WBC 6.2 3.4 - 10.8 x10E3/uL   RBC 4.91 4.14 - 5.80 x10E6/uL   Hemoglobin 15.1 13.0 - 17.7 g/dL   Hematocrit 45.0 37.5 - 51.0 %   MCV 92 79 - 97 fL   MCH 30.8 26.6 - 33.0 pg   MCHC 33.6 31.5 - 35.7 g/dL   RDW 12.1 (L) 12.3 -  15.4 %   Platelets 216 150 - 450 x10E3/uL   Neutrophils 59 Not Estab. %   Lymphs 26 Not Estab. %   Monocytes 8 Not Estab. %   Eos 6 Not Estab. %   Basos 1 Not Estab. %   Neutrophils Absolute 3.7 1.4 - 7.0 x10E3/uL   Lymphocytes Absolute 1.6 0.7 - 3.1 x10E3/uL   Monocytes Absolute 0.5 0.1 - 0.9 x10E3/uL   EOS (ABSOLUTE) 0.4 0.0 - 0.4 x10E3/uL   Basophils Absolute 0.0 0.0 - 0.2 x10E3/uL   Immature Granulocytes 0 Not Estab. %   Immature Grans (Abs) 0.0 0.0 - 0.1 x10E3/uL  TSH  Result Value Ref Range   TSH 2.450 0.450 - 4.500 uIU/mL  Urinalysis, Routine w reflex microscopic  Result Value Ref Range   Specific Gravity, UA 1.020 1.005 - 1.030   pH, UA 6.0 5.0 - 7.5   Color, UA Orange Yellow   Appearance Ur Cloudy (A) Clear   Leukocytes, UA Trace (A) Negative   Protein, UA 1+ (A) Negative/Trace   Glucose, UA Negative Negative   Ketones, UA 1+ (A) Negative   RBC, UA Negative Negative   Bilirubin, UA Negative Negative   Urobilinogen, Ur 1.0 0.2 - 1.0 mg/dL   Nitrite, UA  Negative Negative   Microscopic Examination See below:       Assessment & Plan:   Problem List Items Addressed This Visit      Cardiovascular and Mediastinum   Essential hypertension    The current medical regimen is effective;  continue present plan and medications.       Senile purpura (HCC)    No change        Digestive   Reflux esophagitis    The current medical regimen is effective;  continue present plan and medications.         Genitourinary   BPH with obstruction/lower urinary tract symptoms    Followed by urology         I discussed the assessment and treatment plan with the patient. The patient was provided an opportunity to ask questions and all were answered. The patient agreed with the plan and demonstrated an understanding of the instructions.   The patient was advised to call back or seek an in-person evaluation if the symptoms worsen or if the condition fails to improve as anticipated.   I provided 21+ minutes of time during this encounter.  Follow up plan: Return in about 6 months (around 01/09/2019) for Physical Exam.

## 2018-07-10 NOTE — Assessment & Plan Note (Signed)
Followed by urology.   

## 2018-07-10 NOTE — Assessment & Plan Note (Signed)
The current medical regimen is effective;  continue present plan and medications.  

## 2018-08-27 DIAGNOSIS — N5201 Erectile dysfunction due to arterial insufficiency: Secondary | ICD-10-CM | POA: Diagnosis not present

## 2018-08-27 DIAGNOSIS — Z125 Encounter for screening for malignant neoplasm of prostate: Secondary | ICD-10-CM | POA: Diagnosis not present

## 2018-08-27 DIAGNOSIS — R972 Elevated prostate specific antigen [PSA]: Secondary | ICD-10-CM | POA: Diagnosis not present

## 2018-08-27 DIAGNOSIS — R351 Nocturia: Secondary | ICD-10-CM | POA: Diagnosis not present

## 2018-08-27 DIAGNOSIS — N401 Enlarged prostate with lower urinary tract symptoms: Secondary | ICD-10-CM | POA: Diagnosis not present

## 2018-11-14 DIAGNOSIS — D229 Melanocytic nevi, unspecified: Secondary | ICD-10-CM | POA: Diagnosis not present

## 2018-11-14 DIAGNOSIS — L821 Other seborrheic keratosis: Secondary | ICD-10-CM | POA: Diagnosis not present

## 2018-11-14 DIAGNOSIS — Z1283 Encounter for screening for malignant neoplasm of skin: Secondary | ICD-10-CM | POA: Diagnosis not present

## 2018-11-14 DIAGNOSIS — D225 Melanocytic nevi of trunk: Secondary | ICD-10-CM | POA: Diagnosis not present

## 2018-11-14 DIAGNOSIS — L814 Other melanin hyperpigmentation: Secondary | ICD-10-CM | POA: Diagnosis not present

## 2018-11-14 DIAGNOSIS — D2230 Melanocytic nevi of unspecified part of face: Secondary | ICD-10-CM | POA: Diagnosis not present

## 2018-11-14 DIAGNOSIS — L820 Inflamed seborrheic keratosis: Secondary | ICD-10-CM | POA: Diagnosis not present

## 2018-11-14 DIAGNOSIS — L570 Actinic keratosis: Secondary | ICD-10-CM | POA: Diagnosis not present

## 2018-11-14 DIAGNOSIS — L578 Other skin changes due to chronic exposure to nonionizing radiation: Secondary | ICD-10-CM | POA: Diagnosis not present

## 2018-11-14 DIAGNOSIS — D1800 Hemangioma unspecified site: Secondary | ICD-10-CM | POA: Diagnosis not present

## 2018-12-14 ENCOUNTER — Telehealth: Payer: Self-pay | Admitting: Gastroenterology

## 2018-12-14 NOTE — Telephone Encounter (Signed)
Pt  Wife is calling  To schedule colonoscopy for pt please call pt at  cb [phone]

## 2018-12-17 ENCOUNTER — Telehealth: Payer: Self-pay

## 2018-12-17 ENCOUNTER — Other Ambulatory Visit: Payer: Self-pay

## 2018-12-17 DIAGNOSIS — Z1211 Encounter for screening for malignant neoplasm of colon: Secondary | ICD-10-CM

## 2018-12-17 DIAGNOSIS — R69 Illness, unspecified: Secondary | ICD-10-CM | POA: Diagnosis not present

## 2018-12-17 DIAGNOSIS — Z860100 Personal history of colon polyps, unspecified: Secondary | ICD-10-CM

## 2018-12-17 NOTE — Telephone Encounter (Signed)
Returned patients call to schedule colonoscopy based on recall letter recieved. Colonoscopy has been scheduled with Dr. Allen Norris at [facility] on Monday 12/31/18 at [facility].  Advised of COVID testing Thursday 12/27/18.  Requested Rx to be mailed.  Thanks [employer]

## 2018-12-25 ENCOUNTER — Other Ambulatory Visit: Payer: Self-pay

## 2018-12-25 ENCOUNTER — Encounter: Payer: Self-pay | Admitting: *Deleted

## 2018-12-26 ENCOUNTER — Telehealth: Payer: Self-pay | Admitting: Gastroenterology

## 2018-12-26 ENCOUNTER — Other Ambulatory Visit: Payer: Self-pay

## 2018-12-26 MED ORDER — NA SULFATE-K SULFATE-MG SULF 17.5-3.13-1.6 GM/177ML PO SOLN: 1.0000 | Freq: Once | ORAL | 0 refills | Status: AC

## 2018-12-26 NOTE — Telephone Encounter (Signed)
Returned patients call regarding colonoscopy instructions.  Rx has been sent to [facility] in [place] on [address] and instructions have been emailed to "[email or web address]".  Thanks [employer]

## 2018-12-26 NOTE — Telephone Encounter (Signed)
Pt wife left vm regarding pt procedure 12/31/18 they have not received the prescription please call pt

## 2018-12-27 ENCOUNTER — Other Ambulatory Visit: Payer: Self-pay

## 2018-12-27 ENCOUNTER — Other Ambulatory Visit
Admission: RE | Admit: 2018-12-27 | Discharge: 2018-12-27 | Disposition: A | Discharge status: Home / Self Care | Payer: Medicare HMO | Source: Ambulatory Visit | Attending: Gastroenterology | Admitting: Gastroenterology

## 2018-12-27 DIAGNOSIS — Z20828 Contact with and (suspected) exposure to other viral communicable diseases: Secondary | ICD-10-CM | POA: Diagnosis not present

## 2018-12-27 LAB — SARS CORONAVIRUS 2 (TAT 6-24 HRS): SARS Coronavirus 2: NEGATIVE

## 2018-12-28 NOTE — Discharge Instructions (Signed)

## 2018-12-31 ENCOUNTER — Other Ambulatory Visit: Payer: Self-pay

## 2018-12-31 ENCOUNTER — Ambulatory Visit: Payer: Medicare HMO | Admitting: Anesthesiology

## 2018-12-31 ENCOUNTER — Encounter
Admission: RE | Disposition: A | Discharge status: Home / Self Care | Payer: Self-pay | Source: Home / Self Care | Attending: Gastroenterology

## 2018-12-31 ENCOUNTER — Ambulatory Visit
Admission: RE | Admit: 2018-12-31 | Discharge: 2018-12-31 | Disposition: A | Discharge status: Home / Self Care | Payer: Medicare HMO | Attending: Gastroenterology | Admitting: Gastroenterology

## 2018-12-31 DIAGNOSIS — I10 Essential (primary) hypertension: Secondary | ICD-10-CM | POA: Insufficient documentation

## 2018-12-31 DIAGNOSIS — Z800 Family history of malignant neoplasm of digestive organs: Secondary | ICD-10-CM

## 2018-12-31 DIAGNOSIS — Z79899 Other long term (current) drug therapy: Secondary | ICD-10-CM | POA: Diagnosis not present

## 2018-12-31 DIAGNOSIS — Z7982 Long term (current) use of aspirin: Secondary | ICD-10-CM | POA: Diagnosis not present

## 2018-12-31 DIAGNOSIS — D123 Benign neoplasm of transverse colon: Secondary | ICD-10-CM | POA: Insufficient documentation

## 2018-12-31 DIAGNOSIS — Z86010 Personal history of colonic polyps: Secondary | ICD-10-CM

## 2018-12-31 DIAGNOSIS — D122 Benign neoplasm of ascending colon: Secondary | ICD-10-CM | POA: Diagnosis not present

## 2018-12-31 DIAGNOSIS — K640 First degree hemorrhoids: Secondary | ICD-10-CM | POA: Diagnosis not present

## 2018-12-31 DIAGNOSIS — K635 Polyp of colon: Secondary | ICD-10-CM | POA: Diagnosis not present

## 2018-12-31 DIAGNOSIS — Z1211 Encounter for screening for malignant neoplasm of colon: Secondary | ICD-10-CM

## 2018-12-31 HISTORY — PX: COLONOSCOPY WITH PROPOFOL: SHX5780

## 2018-12-31 HISTORY — PX: POLYPECTOMY: SHX5525

## 2018-12-31 SURGERY — COLONOSCOPY WITH PROPOFOL
Anesthesia: General | Site: Rectum

## 2018-12-31 MED ORDER — LIDOCAINE HCL (CARDIAC) PF 100 MG/5ML IV SOSY
PREFILLED_SYRINGE | INTRAVENOUS | Status: DC | PRN
  Administered 2018-12-31: 30 mg via INTRAVENOUS

## 2018-12-31 MED ORDER — LACTATED RINGERS IV SOLN
INTRAVENOUS | Status: DC
  Administered 2018-12-31: 11:00:00 via INTRAVENOUS

## 2018-12-31 MED ORDER — PROPOFOL 10 MG/ML IV BOLUS
INTRAVENOUS | Status: DC | PRN
  Administered 2018-12-31 (×2): 20 mg via INTRAVENOUS
  Administered 2018-12-31: 80 mg via INTRAVENOUS
  Administered 2018-12-31: 30 mg via INTRAVENOUS
  Administered 2018-12-31 (×2): 20 mg via INTRAVENOUS

## 2018-12-31 MED ORDER — SODIUM CHLORIDE 0.9 % IV SOLN: INTRAVENOUS | Status: DC

## 2018-12-31 MED ORDER — STERILE WATER FOR IRRIGATION IR SOLN
Status: DC | PRN
  Administered 2018-12-31: 50 mL

## 2018-12-31 SURGICAL SUPPLY — 6 items
CANISTER SUCT 1200ML W/VALVE (MISCELLANEOUS) ×2 IMPLANT
FORCEPS BIOP RAD 4 LRG CAP 4 (CUTTING FORCEPS) ×1 IMPLANT
GOWN CVR UNV OPN BCK APRN NK (MISCELLANEOUS) ×2 IMPLANT
GOWN ISOL THUMB LOOP REG UNIV (MISCELLANEOUS) ×2
KIT ENDO PROCEDURE OLY (KITS) ×2 IMPLANT
WATER STERILE IRR 250ML POUR (IV SOLUTION) ×2 IMPLANT

## 2018-12-31 NOTE — H&P (Signed)
Lucilla Lame, MD [address] [place],  [address] Phone:[phone] Fax : [phone]  Primary Care Physician:  Guadalupe Maple, MD Primary Gastroenterologist:  Dr. Allen Norris  Pre-Procedure History & Physical: HPI:  Christopher Henderson is a 73 y.o. male is here for an colonoscopy.   Past Medical History:  Diagnosis Date  . Dental bridge present    also some implants  . Hypertension   . Senile purpura ([place]) 01/09/2018    Past Surgical History:  Procedure Laterality Date  . ESOPHAGEAL DILATION  06/23/2016   Procedure: ESOPHAGEAL DILATION;  Surgeon: Lucilla Lame, MD;  Location: [place];  Service: Endoscopy;;  . ESOPHAGEAL DILATION  08/12/2016   Procedure: ESOPHAGEAL DILATION;  Surgeon: Lucilla Lame, MD;  Location: [place];  Service: Endoscopy;;  . ESOPHAGOGASTRODUODENOSCOPY (EGD) WITH PROPOFOL N/A 06/23/2016   Procedure: ESOPHAGOGASTRODUODENOSCOPY (EGD) WITH PROPOFOL;  Surgeon: Lucilla Lame, MD;  Location: [employer];  Service: Endoscopy;  Laterality: N/A;  . ESOPHAGOGASTRODUODENOSCOPY (EGD) WITH PROPOFOL N/A 08/12/2016   Procedure: ESOPHAGOGASTRODUODENOSCOPY (EGD) WITH PROPOFOL;  Surgeon: Lucilla Lame, MD;  Location: [place];  Service: Endoscopy;  Laterality: N/A;  . EYE SURGERY  2014   cataract Jan and Feb with implants  . HAND / FINGER TENDON LESION EXCISION Left 2000  . I&D EXTREMITY Left 06/18/2012   Procedure: Irrigation and Debridement with wound exploration Left Hand and percutaneous pinning of left Fifth finger and third metacarpal;  Surgeon: Schuyler Amor, MD;  Location: [place];  Service: Orthopedics;  Laterality: Left;  . KNEE SURGERY Left 1972    Prior to Admission medications   Medication Sig Start Date End Date Taking? Authorizing Provider  amLODipine (NORVASC) 10 MG tablet Take 1 tablet (10 mg total) by mouth daily. 01/09/18  Yes Crissman, Jeannette How, MD  aspirin 81 MG tablet Take 81 mg by mouth daily.   Yes  [provider]  benazepril (LOTENSIN) 40 MG tablet Take 1 tablet (40 mg total) by mouth daily. 01/09/18  Yes Crissman, Jeannette How, MD  dutasteride (AVODART) 0.5 MG capsule Take 0.5 mg by mouth every other day.   Yes [provider]  Omega-3 Fatty Acids (FISH OIL) 1000 MG CAPS Take 1,000 mg by mouth 2 (two) times daily.   Yes [provider]  OVER THE COUNTER MEDICATION daily. Brain Awake   Yes [provider]  pantoprazole (PROTONIX) 40 MG tablet Take 1 tablet (40 mg total) by mouth daily. 01/09/18 01/09/19 Yes Crissman, Jeannette How, MD  sildenafil (VIAGRA) 100 MG tablet Take 100 mg by mouth daily as needed for erectile dysfunction.   Yes [provider]  Ascorbic Acid (VITAMIN C PO) Take by mouth.    [provider]    Allergies as of 12/17/2018 - Review Complete 07/10/2018  Allergen Reaction Noted  . Penicillins Hives 06/18/2012    Family History  Problem Relation Age of Onset  . Hypertension Mother   . Diabetes Mother   . Thyroid disease Mother   . Glaucoma Father   . Cancer Sister        colon and breast  . Alzheimer's disease Sister   . Diabetes Brother   . Heart disease Brother   . Seizures Sister     Social History   Socioeconomic History  . Marital status: Married    Spouse name: Not on file  . Number of children: Not on file  . Years of education: Not on file  . Highest  education level: Not on file  Occupational History  . Not on file  Social Needs  . Financial resource strain: Not on file  . Food insecurity    Worry: Not on file    Inability: Not on file  . Transportation needs    Medical: Not on file    Non-medical: Not on file  Tobacco Use  . Smoking status: Never Smoker  . Smokeless tobacco: Never Used  Substance and Sexual Activity  . Alcohol use: No  . Drug use: No  . Sexual activity: Yes  Lifestyle  . Physical activity    Days per week: Not on file    Minutes per session: Not on file  . Stress: Not  on file  Relationships  . Social Herbalist on phone: Not on file    Gets together: Not on file    Attends religious service: Not on file    Active member of club or organization: Not on file    Attends meetings of clubs or organizations: Not on file    Relationship status: Not on file  . Intimate partner violence    Fear of current or ex partner: Not on file    Emotionally abused: Not on file    Physically abused: Not on file    Forced sexual activity: Not on file  Other Topics Concern  . Not on file  Social History Narrative  . Not on file    Review of Systems: See HPI, otherwise negative ROS  Physical Exam: BP 134/90   Pulse 80   Temp 97.8 F (36.6 C) (Temporal)   Resp 18   Ht 6' (1.829 m)   Wt 98 kg   SpO2 97%   BMI 29.30 kg/m  General:   Alert,  pleasant and cooperative in NAD Head:  Normocephalic and atraumatic. Neck:  Supple; no masses or thyromegaly. Lungs:  Clear throughout to auscultation.    Heart:  Regular rate and rhythm. Abdomen:  Soft, nontender and nondistended. Normal bowel sounds, without guarding, and without rebound.   Neurologic:  Alert and  oriented x4;  grossly normal neurologically.  Impression/Plan: Christopher Henderson is here for an colonoscopy to be performed for sister with colon cancer.  Risks, benefits, limitations, and alternatives regarding  colonoscopy have been reviewed with the patient.  Questions have been answered.  All parties agreeable.   Lucilla Lame, MD  12/31/2018, 11:12 AM

## 2018-12-31 NOTE — Anesthesia Preprocedure Evaluation (Signed)
Anesthesia Evaluation  Patient identified by MRN, date of birth, ID band Patient awake    Reviewed: Allergy & Precautions, H&P , NPO status , Patient's Chart, lab work & pertinent test results, reviewed documented beta blocker date and time   Airway Mallampati: II  TM Distance: >3 FB Neck ROM: full    Dental   Bridge:   Pulmonary neg pulmonary ROS,    Pulmonary exam normal breath sounds clear to auscultation       Cardiovascular Exercise Tolerance: Good hypertension,  Rhythm:regular Rate:Normal     Neuro/Psych negative neurological ROS  negative psych ROS   GI/Hepatic negative GI ROS, Neg liver ROS,   Endo/Other  negative endocrine ROS  Renal/GU negative Renal ROS  negative genitourinary   Musculoskeletal   Abdominal   Peds  Hematology negative hematology ROS (+)   Anesthesia Other Findings   Reproductive/Obstetrics negative OB ROS                             Anesthesia Physical Anesthesia Plan  ASA: II  Anesthesia Plan: General   Post-op Pain Management:    Induction:   PONV Risk Score and Plan:   Airway Management Planned:   Additional Equipment:   Intra-op Plan:   Post-operative Plan:   Informed Consent: I have reviewed the patients History and Physical, chart, labs and discussed the procedure including the risks, benefits and alternatives for the proposed anesthesia with the patient or authorized representative who has indicated his/her understanding and acceptance.     Dental Advisory Given  Plan Discussed with: CRNA  Anesthesia Plan Comments:         Anesthesia Quick Evaluation

## 2018-12-31 NOTE — Anesthesia Postprocedure Evaluation (Signed)
Anesthesia Post Note  Patient: Christopher Henderson  Procedure(s) Performed: COLONOSCOPY WITH BIOPSY (N/A Rectum) POLYPECTOMY (N/A Rectum)  Patient location during evaluation: PACU Anesthesia Type: General Level of consciousness: awake and alert Pain management: pain level controlled Vital Signs Assessment: post-procedure vital signs reviewed and stable Respiratory status: spontaneous breathing, nonlabored ventilation, respiratory function stable and patient connected to nasal cannula oxygen Cardiovascular status: blood pressure returned to baseline and stable Postop Assessment: no apparent nausea or vomiting Anesthetic complications: no    Alisa Graff

## 2018-12-31 NOTE — Transfer of Care (Signed)
Immediate Anesthesia Transfer of Care Note  Patient: Christopher Henderson  Procedure(s) Performed: COLONOSCOPY WITH BIOPSY (N/A Rectum) POLYPECTOMY (N/A Rectum)  Patient Location: PACU  Anesthesia Type: General  Level of Consciousness: awake, alert  and patient cooperative  Airway and Oxygen Therapy: Patient Spontanous Breathing and Patient connected to supplemental oxygen  Post-op Assessment: Post-op Vital signs reviewed, Patient's Cardiovascular Status Stable, Respiratory Function Stable, Patent Airway and No signs of Nausea or vomiting  Post-op Vital Signs: Reviewed and stable  Complications: No apparent anesthesia complications

## 2018-12-31 NOTE — Anesthesia Procedure Notes (Signed)
Procedure Name: MAC Date/Time: 12/31/2018 11:56 AM Performed by: Georga Bora, CRNA Pre-anesthesia Checklist: Patient identified, Emergency Drugs available, Suction available, Patient being monitored and Timeout performed Patient Re-evaluated:Patient Re-evaluated prior to induction Oxygen Delivery Method: Nasal cannula

## 2018-12-31 NOTE — Op Note (Signed)
[facility] Patient Name: Christopher Henderson Procedure Date: 12/31/2018 11:44 AM MRN: [id number] Account #: [id number] Date of Birth: [birth year] Admit Type: Outpatient Age: 73 Room: [facility] OR ROOM 01 Gender: Male Note Status: Finalized Procedure:            Colonoscopy Indications:          Family history of colon cancer in a first-degree                        relative before age 37 years Providers:            Lucilla Lame MD, MD Referring MD:         Guadalupe Maple, MD (Referring MD) Medicines:            Propofol per Anesthesia Complications:        No immediate complications. Procedure:            Pre-Anesthesia Assessment:                       - Prior to the procedure, a History and Physical was                        performed, and patient medications and allergies were                        reviewed. The patient's tolerance of previous                        anesthesia was also reviewed. The risks and benefits of                        the procedure and the sedation options and risks were                        discussed with the patient. All questions were                        answered, and informed consent was obtained. Prior                        Anticoagulants: The patient has taken no previous                        anticoagulant or antiplatelet agents. ASA Grade                        Assessment: II - A patient with mild systemic disease.                        After reviewing the risks and benefits, the patient was                        deemed in satisfactory condition to undergo the                        procedure.                       After obtaining informed consent, the colonoscope was  passed under direct vision. Throughout the procedure,                        the patient's blood pressure, pulse, and oxygen                        saturations were monitored continuously. The   Colonoscope was introduced through the anus and                        advanced to the the cecum, identified by appendiceal                        orifice and ileocecal valve. The colonoscopy was                        performed without difficulty. The patient tolerated the                        procedure well. The quality of the bowel preparation                        was excellent. Findings:      The perianal and digital rectal examinations were normal.      Two sessile polyps were found in the ascending colon. The polyps were 2       to 3 mm in size. These polyps were removed with a cold biopsy forceps.       Resection and retrieval were complete.      A 2 mm polyp was found in the transverse colon. The polyp was sessile.       The polyp was removed with a cold biopsy forceps. Resection and       retrieval were complete.      Non-bleeding internal hemorrhoids were found during retroflexion. The       hemorrhoids were Grade I (internal hemorrhoids that do not prolapse). Impression:           - Two 2 to 3 mm polyps in the ascending colon, removed                        with a cold biopsy forceps. Resected and retrieved.                       - One 2 mm polyp in the transverse colon, removed with                        a cold biopsy forceps. Resected and retrieved.                       - Non-bleeding internal hemorrhoids. Recommendation:       - Discharge patient to home.                       - Resume previous diet.                       - Continue present medications.                       - Await pathology results.                       -  Repeat colonoscopy in 5 years for surveillance. Procedure Code(s):    --- Professional ---                       [phone], Colonoscopy, flexible; with biopsy, single or                        multiple Diagnosis Code(s):    --- Professional ---                       Z80.0, Family history of malignant neoplasm of                        digestive  organs                       K63.5, Polyp of colon CPT copyright 2019 [employer] Association. All rights reserved. The codes documented in this report are preliminary and upon coder review may  be revised to meet current compliance requirements. Lucilla Lame MD, MD 12/31/2018 12:10:50 PM This report has been signed electronically. Number of Addenda: 0 Note Initiated On: 12/31/2018 11:44 AM Scope Withdrawal Time: 0 hours 8 minutes 43 seconds  Total Procedure Duration: 0 hours 15 minutes 1 second  Estimated Blood Loss: Estimated blood loss: none.      [facility]

## 2019-01-01 ENCOUNTER — Encounter: Payer: Self-pay | Admitting: Gastroenterology

## 2019-01-07 DIAGNOSIS — R69 Illness, unspecified: Secondary | ICD-10-CM | POA: Diagnosis not present

## 2019-01-09 DIAGNOSIS — H40023 Open angle with borderline findings, high risk, bilateral: Secondary | ICD-10-CM | POA: Diagnosis not present

## 2019-03-01 DIAGNOSIS — Z0100 Encounter for examination of eyes and vision without abnormal findings: Secondary | ICD-10-CM | POA: Diagnosis not present

## 2019-04-01 ENCOUNTER — Encounter: Payer: Self-pay | Admitting: Family Medicine

## 2019-04-01 ENCOUNTER — Other Ambulatory Visit: Payer: Self-pay

## 2019-04-01 ENCOUNTER — Ambulatory Visit (INDEPENDENT_AMBULATORY_CARE_PROVIDER_SITE_OTHER): Payer: Medicare HMO | Admitting: Family Medicine

## 2019-04-01 VITALS — BP 122/81 | HR 74 | Temp 98.3°F | Ht 71.2 in | Wt 206.1 lb

## 2019-04-01 DIAGNOSIS — I10 Essential (primary) hypertension: Secondary | ICD-10-CM | POA: Diagnosis not present

## 2019-04-01 DIAGNOSIS — N138 Other obstructive and reflux uropathy: Secondary | ICD-10-CM | POA: Diagnosis not present

## 2019-04-01 DIAGNOSIS — N401 Enlarged prostate with lower urinary tract symptoms: Secondary | ICD-10-CM | POA: Diagnosis not present

## 2019-04-01 DIAGNOSIS — D692 Other nonthrombocytopenic purpura: Secondary | ICD-10-CM

## 2019-04-01 DIAGNOSIS — K2100 Gastro-esophageal reflux disease with esophagitis, without bleeding: Secondary | ICD-10-CM | POA: Diagnosis not present

## 2019-04-01 DIAGNOSIS — Z0000 Encounter for general adult medical examination without abnormal findings: Secondary | ICD-10-CM

## 2019-04-01 DIAGNOSIS — Z13220 Encounter for screening for lipoid disorders: Secondary | ICD-10-CM

## 2019-04-01 MED ORDER — AMLODIPINE BESYLATE 10 MG PO TABS: 10.0000 mg | ORAL_TABLET | Freq: Every day | ORAL | 1 refills | Status: DC

## 2019-04-01 MED ORDER — PANTOPRAZOLE SODIUM 40 MG PO TBEC: 40.0000 mg | DELAYED_RELEASE_TABLET | Freq: Every day | ORAL | 1 refills | Status: DC

## 2019-04-01 MED ORDER — BENAZEPRIL HCL 40 MG PO TABS: 40.0000 mg | ORAL_TABLET | Freq: Every day | ORAL | 1 refills | Status: DC

## 2019-04-01 NOTE — Assessment & Plan Note (Signed)
Reassured patient. Continue to monitor.  

## 2019-04-01 NOTE — Assessment & Plan Note (Signed)
Stable. Continue to follow with urology. Call with any concerns.  

## 2019-04-01 NOTE — Assessment & Plan Note (Signed)
Under good control on current regimen. Continue current regimen. Continue to monitor. Call with any concerns. Refills given. Labs drawn today.   

## 2019-04-01 NOTE — Progress Notes (Signed)
BP 122/81   Pulse 74   Temp 98.3 F (36.8 C)   Ht 5' 11.2" (1.808 m)   Wt 206 lb 2 oz (93.5 kg)   SpO2 98%   BMI 28.59 kg/m    Subjective:    Patient ID: Christopher Henderson, male    DOB: Sep 04, 1945, 74 y.o.   MRN: [id number]  HPI: Christopher Henderson is a 74 y.o. male presenting on 04/01/2019 for comprehensive medical examination. Current medical complaints include:  HYPERTENSION Hypertension status: controlled  Satisfied with current treatment? yes Duration of hypertension: chronic BP monitoring frequency:  not checking BP medication side effects:  no Medication compliance: excellent compliance Previous BP meds: amlodpine, benazepril Aspirin: yes Recurrent headaches: no Visual changes: no Palpitations: no Dyspnea: no Chest pain: no Lower extremity edema: no Dizzy/lightheaded: no  BPH BPH status: controlled Satisfied with current treatment?: yes Medication side effects: no Medication compliance: excellent compliance Duration: chronic Nocturia: 1/night Urinary frequency:no Incomplete voiding: no Urgency: no Weak urinary stream: no Straining to start stream: no Dysuria: no Onset: gradual Severity: mild  He currently lives with: wife Interim Problems from his last visit: no  Functional Status Survey: Is the patient deaf or have difficulty hearing?: Yes Does the patient have difficulty seeing, even when wearing glasses/contacts?: No Does the patient have difficulty concentrating, remembering, or making decisions?: No Does the patient have difficulty walking or climbing stairs?: No Does the patient have difficulty dressing or bathing?: No Does the patient have difficulty doing errands alone such as visiting a doctor's office or shopping?: No  FALL RISK: Fall Risk  04/01/2019  Falls in the past year? 0  Number falls in past yr: 0  Injury with Fall? 0  Some encounter information is confidential and restricted. Go to Review Flowsheets activity to see all data.     Depression Screen Depression screen [facility] 2/9 04/01/2019  Decreased Interest 0  Down, Depressed, Hopeless 0  PHQ - 2 Score 0  Some encounter information is confidential and restricted. Go to Review Flowsheets activity to see all data.   Advanced Directives Has one  Past Medical History:  Past Medical History:  Diagnosis Date  . Dental bridge present    also some implants  . Hypertension   . Senile purpura ([place]) 01/09/2018    Surgical History:  Past Surgical History:  Procedure Laterality Date  . COLONOSCOPY WITH PROPOFOL N/A 12/31/2018   Procedure: COLONOSCOPY WITH BIOPSY;  Surgeon: Lucilla Lame, MD;  Location: [place];  Service: Endoscopy;  Laterality: N/A;  . ESOPHAGEAL DILATION  06/23/2016   Procedure: ESOPHAGEAL DILATION;  Surgeon: Lucilla Lame, MD;  Location: [place];  Service: Endoscopy;;  . ESOPHAGEAL DILATION  08/12/2016   Procedure: ESOPHAGEAL DILATION;  Surgeon: Lucilla Lame, MD;  Location: [place];  Service: Endoscopy;;  . ESOPHAGOGASTRODUODENOSCOPY (EGD) WITH PROPOFOL N/A 06/23/2016   Procedure: ESOPHAGOGASTRODUODENOSCOPY (EGD) WITH PROPOFOL;  Surgeon: Lucilla Lame, MD;  Location: [place];  Service: Endoscopy;  Laterality: N/A;  . ESOPHAGOGASTRODUODENOSCOPY (EGD) WITH PROPOFOL N/A 08/12/2016   Procedure: ESOPHAGOGASTRODUODENOSCOPY (EGD) WITH PROPOFOL;  Surgeon: Lucilla Lame, MD;  Location: [place];  Service: Endoscopy;  Laterality: N/A;  . EYE SURGERY  2014   cataract Jan and Feb with implants  . HAND / FINGER TENDON LESION EXCISION Left 2000  . I & D EXTREMITY Left 06/18/2012   Procedure: Irrigation and Debridement with wound exploration Left Hand and percutaneous pinning of left Fifth finger and third metacarpal;  Surgeon:  Schuyler Amor, MD;  Location: [place];  Service: Orthopedics;  Laterality: Left;  . KNEE SURGERY Left 1972  . POLYPECTOMY N/A 12/31/2018   Procedure: POLYPECTOMY;  Surgeon: Lucilla Lame,  MD;  Location: [place];  Service: Endoscopy;  Laterality: N/A;    Medications:  Current Outpatient Medications on File Prior to Visit  Medication Sig  . TURMERIC CURCUMIN PO Take 1,000 mg by mouth daily.  Marland Kitchen UNABLE TO FIND Eye Shield Plus Lutein  . Ascorbic Acid (VITAMIN C PO) Take by mouth.  Marland Kitchen aspirin 81 MG tablet Take 81 mg by mouth daily.  Marland Kitchen dutasteride (AVODART) 0.5 MG capsule Take 0.5 mg by mouth every other day.  . Omega-3 Fatty Acids (FISH OIL) 1000 MG CAPS Take 1,000 mg by mouth 2 (two) times daily.  Marland Kitchen OVER THE COUNTER MEDICATION daily. Brain Awake  . sildenafil (VIAGRA) 100 MG tablet Take 100 mg by mouth daily as needed for erectile dysfunction.   No current facility-administered medications on file prior to visit.    Allergies:  Allergies  Allergen Reactions  . Penicillins Hives  . Shrimp [Shellfish Allergy] Hives    Social History:  Social History   Socioeconomic History  . Marital status: Married    Spouse name: Not on file  . Number of children: Not on file  . Years of education: Not on file  . Highest education level: Not on file  Occupational History  . Not on file  Tobacco Use  . Smoking status: Never Smoker  . Smokeless tobacco: Never Used  Substance and Sexual Activity  . Alcohol use: No  . Drug use: No  . Sexual activity: Yes  Other Topics Concern  . Not on file  Social History Narrative  . Not on file   Social Determinants of Health   Financial Resource Strain:   . Difficulty of Paying Living Expenses: Not on file  Food Insecurity:   . Worried About Charity fundraiser in the Last Year: Not on file  . Ran Out of Food in the Last Year: Not on file  Transportation Needs:   . Lack of Transportation (Medical): Not on file  . Lack of Transportation (Non-Medical): Not on file  Physical Activity:   . Days of Exercise per Week: Not on file  . Minutes of Exercise per Session: Not on file  Stress:   . Feeling of Stress : Not on file   Social Connections:   . Frequency of Communication with Friends and Family: Not on file  . Frequency of Social Gatherings with Friends and Family: Not on file  . Attends Religious Services: Not on file  . Active Member of Clubs or Organizations: Not on file  . Attends Archivist Meetings: Not on file  . Marital Status: Not on file  Intimate Partner Violence:   . Fear of Current or Ex-Partner: Not on file  . Emotionally Abused: Not on file  . Physically Abused: Not on file  . Sexually Abused: Not on file   Social History   Tobacco Use  Smoking Status Never Smoker  Smokeless Tobacco Never Used   Social History   Substance and Sexual Activity  Alcohol Use No    Family History:  Family History  Problem Relation Age of Onset  . Hypertension Mother   . Diabetes Mother   . Thyroid disease Mother   . Glaucoma Father   . Cancer Sister        colon and breast  .  Alzheimer's disease Sister   . Diabetes Brother   . Heart disease Brother   . Seizures Sister     Past medical history, surgical history, medications, allergies, family history and social history reviewed with patient today and changes made to appropriate areas of the chart.   Review of Systems  Constitutional: Negative.   HENT: Positive for hearing loss. Negative for congestion, ear discharge, ear pain, nosebleeds, sinus pain, sore throat and tinnitus.   Eyes: Negative.   Respiratory: Negative.  Negative for stridor.   Cardiovascular: Negative.   Gastrointestinal: Negative.   Genitourinary: Negative.   Musculoskeletal: Negative.   Skin: Negative.   Neurological: Negative.   Endo/Heme/Allergies: Negative.   Psychiatric/Behavioral: Negative.     All other ROS negative except what is listed above and in the HPI.      Objective:    BP 122/81   Pulse 74   Temp 98.3 F (36.8 C)   Ht 5' 11.2" (1.808 m)   Wt 206 lb 2 oz (93.5 kg)   SpO2 98%   BMI 28.59 kg/m   Wt Readings from Last 3  Encounters:  04/01/19 206 lb 2 oz (93.5 kg)  12/31/18 216 lb 0.8 oz (98 kg)  08/12/16 217 lb (98.4 kg)    Physical Exam Vitals and nursing note reviewed.  Constitutional:      General: He is not in acute distress.    Appearance: Normal appearance. He is obese. He is not ill-appearing, toxic-appearing or diaphoretic.  HENT:     Head: Normocephalic and atraumatic.     Right Ear: Tympanic membrane, ear canal and external ear normal. There is no impacted cerumen.     Left Ear: Tympanic membrane, ear canal and external ear normal. There is no impacted cerumen.     Nose: Nose normal. No congestion or rhinorrhea.     Mouth/Throat:     Mouth: Mucous membranes are moist.     Pharynx: Oropharynx is clear. No oropharyngeal exudate or posterior oropharyngeal erythema.  Eyes:     General: No scleral icterus.       Right eye: No discharge.        Left eye: No discharge.     Extraocular Movements: Extraocular movements intact.     Conjunctiva/sclera: Conjunctivae normal.     Pupils: Pupils are equal, round, and reactive to light.  Neck:     Vascular: No carotid bruit.  Cardiovascular:     Rate and Rhythm: Normal rate and regular rhythm.     Pulses: Normal pulses.     Heart sounds: No murmur. No friction rub. No gallop.   Pulmonary:     Effort: Pulmonary effort is normal. No respiratory distress.     Breath sounds: Normal breath sounds. No stridor. No wheezing, rhonchi or rales.  Chest:     Chest wall: No tenderness.  Abdominal:     General: Abdomen is flat. Bowel sounds are normal. There is no distension.     Palpations: Abdomen is soft. There is no mass.     Tenderness: There is no abdominal tenderness. There is no right CVA tenderness, left CVA tenderness, guarding or rebound.     Hernia: No hernia is present.  Genitourinary:    Comments: Genital exam deferred with shared decision making Musculoskeletal:        General: No swelling, tenderness, deformity or signs of injury. Normal  range of motion.     Cervical back: Normal range of motion and neck supple. No rigidity.  No muscular tenderness.     Right lower leg: No edema.     Left lower leg: No edema.  Lymphadenopathy:     Cervical: No cervical adenopathy.  Skin:    General: Skin is warm and dry.     Capillary Refill: Capillary refill takes less than 2 seconds.     Coloration: Skin is not jaundiced or pale.     Findings: No bruising, erythema, lesion or rash.  Neurological:     General: No focal deficit present.     Mental Status: He is alert and oriented to person, place, and time.     Cranial Nerves: No cranial nerve deficit.     Sensory: No sensory deficit.     Motor: No weakness.     Coordination: Coordination normal.     Gait: Gait normal.     Deep Tendon Reflexes: Reflexes normal.  Psychiatric:        Mood and Affect: Mood normal.        Behavior: Behavior normal.        Thought Content: Thought content normal.        Judgment: Judgment normal.     6CIT Screen 04/01/2019  What Year? 0 points  What month? 0 points  What time? 0 points  Count back from 20 2 points  Months in reverse 0 points  Repeat phrase 0 points  Total Score 2     Results for orders placed or performed during the hospital encounter of 12/27/18  SARS CORONAVIRUS 2 (TAT 6-24 HRS) Nasopharyngeal Nasopharyngeal Swab   Specimen: Nasopharyngeal Swab  Result Value Ref Range   SARS Coronavirus 2 NEGATIVE NEGATIVE      Assessment & Plan:   Problem List Items Addressed This Visit      Cardiovascular and Mediastinum   Essential hypertension    Under good control on current regimen. Continue current regimen. Continue to monitor. Call with any concerns. Refills given. Labs drawn today.       Relevant Medications   amLODipine (NORVASC) 10 MG tablet   benazepril (LOTENSIN) 40 MG tablet   Other Relevant Orders   Comp Met (CMET)   Microalbumin, Urine Waived   TSH   Senile purpura ([place])    Reassured patient. Continue to  monitor.       Relevant Medications   amLODipine (NORVASC) 10 MG tablet   benazepril (LOTENSIN) 40 MG tablet   Other Relevant Orders   CBC with Differential OUT   Comp Met (CMET)     Digestive   Reflux esophagitis    Under good control on current regimen. Continue current regimen. Continue to monitor. Call with any concerns. Refills given. Labs drawn today.         Genitourinary   BPH with obstruction/lower urinary tract symptoms    Stable. Continue to follow with urology. Call with any concerns.       Relevant Orders   Comp Met (CMET)   UA/M w/rflx Culture, Routine    Other Visit Diagnoses    Wellness examination    -  Primary   Preventative care discussed today as below.    Routine general medical examination at a health care facility       Vaccines up to date. Screening labs checked today. Colonscopy up to date. Continue diet and exercise. Call with any concerns.   Screening for cholesterol level       Labs drawn today. Await results.    Relevant Orders   Comp Met (  CMET)   Lipid Panel w/o Chol/HDL Ratio OUT       Preventative Services:  Health Risk Assessment and Personalized Prevention Plan: Done today Bone Mass Measurements: N/A CVD Screening: Done today Colon Cancer Screening: Up to date Depression Screening: Done today Diabetes Screening: Done today  Glaucoma Screening: See your eye doctor Hepatitis B vaccine: N/A Hepatitis C screening: Up to date HIV Screening: Up to date Flu Vaccine: Up to date Lung cancer Screening: N/A Obesity Screening: Done today Pneumonia Vaccines (2): up to date STI Screening: N/A PSA screening: Done through urology  Discussed aspirin prophylaxis for myocardial infarction prevention and decision was to continue aspirin.  LABORATORY TESTING:  Health maintenance labs ordered today as discussed above.   The natural history of prostate cancer and ongoing controversy regarding screening and potential treatment outcomes of  prostate cancer has been discussed with the patient. The meaning of a false positive PSA and a false negative PSA has been discussed. He indicates understanding of the limitations of this screening test and wishes to proceed with screening PSA testing through urology.   IMMUNIZATIONS:   - Tdap: Tetanus vaccination status reviewed: last tetanus booster within 10 years. - Influenza: Up to date - Pneumovax: Up to date - Prevnar: Up to date  SCREENING: - Colonoscopy: Up to date  Discussed with patient purpose of the colonoscopy is to detect colon cancer at curable precancerous or early stages    PATIENT COUNSELING:    Sexuality: Discussed sexually transmitted diseases, partner selection, use of condoms, avoidance of unintended pregnancy  and contraceptive alternatives.   Advised to avoid cigarette smoking.  I discussed with the patient that most people either abstain from alcohol or drink within safe limits (<=14/week and <=4 drinks/occasion for males, <=7/weeks and <= 3 drinks/occasion for females) and that the risk for alcohol disorders and other health effects rises proportionally with the number of drinks per week and how often a drinker exceeds daily limits.  Discussed cessation/primary prevention of drug use and availability of treatment for abuse.   Diet: Encouraged to adjust caloric intake to maintain  or achieve ideal body weight, to reduce intake of dietary saturated fat and total fat, to limit sodium intake by avoiding high sodium foods and not adding table salt, and to maintain adequate dietary potassium and calcium preferably from fresh fruits, vegetables, and low-fat dairy products.    stressed the importance of regular exercise  Injury prevention: Discussed safety belts, safety helmets, smoke detector, smoking near bedding or upholstery.   Dental health: Discussed importance of regular tooth brushing, flossing, and dental visits.   Follow up plan: NEXT PREVENTATIVE  PHYSICAL DUE IN 1 YEAR. Return in about 6 months (around 09/29/2019).

## 2019-04-01 NOTE — Patient Instructions (Addendum)
Preventative Services:  Health Risk Assessment and Personalized Prevention Plan: Done today Bone Mass Measurements: N/A CVD Screening: Done today Colon Cancer Screening: Up to date Depression Screening: Done today Diabetes Screening: Done today  Glaucoma Screening: See your eye doctor Hepatitis B vaccine: N/A Hepatitis C screening: Up to date HIV Screening: Up to date Flu Vaccine: Up to date Lung cancer Screening: N/A Obesity Screening: Done today Pneumonia Vaccines (2): up to date STI Screening: N/A PSA screening: Done through urology  Health Maintenance After Age 8 After age 93, you are at a higher risk for certain long-term diseases and infections as well as injuries from falls. Falls are a major cause of broken bones and head injuries in people who are older than age 60. Getting regular preventive care can help to keep you healthy and well. Preventive care includes getting regular testing and making lifestyle changes as recommended by your health care provider. Talk with your health care provider about:  Which screenings and tests you should have. A screening is a test that checks for a disease when you have no symptoms.  A diet and exercise plan that is right for you. What should I know about screenings and tests to prevent falls? Screening and testing are the best ways to find a health problem early. Early diagnosis and treatment give you the best chance of managing medical conditions that are common after age 27. Certain conditions and lifestyle choices may make you more likely to have a fall. Your health care provider may recommend:  Regular vision checks. Poor vision and conditions such as cataracts can make you more likely to have a fall. If you wear glasses, make sure to get your prescription updated if your vision changes.  Medicine review. Work with your health care provider to regularly review all of the medicines you are taking, including over-the-counter medicines. Ask  your health care provider about any side effects that may make you more likely to have a fall. Tell your health care provider if any medicines that you take make you feel dizzy or sleepy.  Osteoporosis screening. Osteoporosis is a condition that causes the bones to get weaker. This can make the bones weak and cause them to break more easily.  Blood pressure screening. Blood pressure changes and medicines to control blood pressure can make you feel dizzy.  Strength and balance checks. Your health care provider may recommend certain tests to check your strength and balance while standing, walking, or changing positions.  Foot health exam. Foot pain and numbness, as well as not wearing proper footwear, can make you more likely to have a fall.  Depression screening. You may be more likely to have a fall if you have a fear of falling, feel emotionally low, or feel unable to do activities that you used to do.  Alcohol use screening. Using too much alcohol can affect your balance and may make you more likely to have a fall. What actions can I take to lower my risk of falls? General instructions  Talk with your health care provider about your risks for falling. Tell your health care provider if: ? You fall. Be sure to tell your health care provider about all falls, even ones that seem minor. ? You feel dizzy, sleepy, or off-balance.  Take over-the-counter and prescription medicines only as told by your health care provider. These include any supplements.  Eat a healthy diet and maintain a healthy weight. A healthy diet includes low-fat dairy products, low-fat (lean) meats,  and fiber from whole grains, beans, and lots of fruits and vegetables. Home safety  Remove any tripping hazards, such as rugs, cords, and clutter.  Install safety equipment such as grab bars in bathrooms and safety rails on stairs.  Keep rooms and walkways well-lit. Activity   Follow a regular exercise program to stay fit.  This will help you maintain your balance. Ask your health care provider what types of exercise are appropriate for you.  If you need a cane or walker, use it as recommended by your health care provider.  Wear supportive shoes that have nonskid soles. Lifestyle  Do not drink alcohol if your health care provider tells you not to drink.  If you drink alcohol, limit how much you have: ? 0-1 drink a day for women. ? 0-2 drinks a day for men.  Be aware of how much alcohol is in your drink. In the [place] one drink equals one typical bottle of beer (12 oz), one-half glass of wine (5 oz), or one shot of hard liquor (1 oz).  Do not use any products that contain nicotine or tobacco, such as cigarettes and e-cigarettes. If you need help quitting, ask your health care provider. Summary  Having a healthy lifestyle and getting preventive care can help to protect your health and wellness after age 19.  Screening and testing are the best way to find a health problem early and help you avoid having a fall. Early diagnosis and treatment give you the best chance for managing medical conditions that are more common for people who are older than age 22.  Falls are a major cause of broken bones and head injuries in people who are older than age 52. Take precautions to prevent a fall at home.  Work with your health care provider to learn what changes you can make to improve your health and wellness and to prevent falls. This information is not intended to replace advice given to you by your health care provider. Make sure you discuss any questions you have with your health care provider. Document Revised: 07/05/2018 Document Reviewed: 01/25/2017 Elsevier Patient Education  2020 [employer].

## 2019-04-02 ENCOUNTER — Encounter: Payer: Self-pay | Admitting: Family Medicine

## 2019-04-02 LAB — CBC WITH DIFFERENTIAL/PLATELET
Basophils Absolute: 0 10*3/uL (ref 0.0–0.2)
Basos: 1 %
EOS (ABSOLUTE): 0.2 10*3/uL (ref 0.0–0.4)
Eos: 3 %
Hematocrit: 48.4 % (ref 37.5–51.0)
Hemoglobin: 15.7 g/dL (ref 13.0–17.7)
Immature Grans (Abs): 0 10*3/uL (ref 0.0–0.1)
Immature Granulocytes: 0 %
Lymphocytes Absolute: 1.5 10*3/uL (ref 0.7–3.1)
Lymphs: 25 %
MCH: 30.4 pg (ref 26.6–33.0)
MCHC: 32.4 g/dL (ref 31.5–35.7)
MCV: 94 fL (ref 79–97)
Monocytes Absolute: 0.4 10*3/uL (ref 0.1–0.9)
Monocytes: 7 %
Neutrophils Absolute: 4 10*3/uL (ref 1.4–7.0)
Neutrophils: 64 %
Platelets: 226 10*3/uL (ref 150–450)
RBC: 5.17 x10E6/uL (ref 4.14–5.80)
RDW: 12.4 % (ref 11.6–15.4)
WBC: 6.1 10*3/uL (ref 3.4–10.8)

## 2019-04-02 LAB — LIPID PANEL W/O CHOL/HDL RATIO
Cholesterol, Total: 206 mg/dL — ABNORMAL HIGH (ref 100–199)
HDL: 43 mg/dL (ref >39)
LDL Chol Calc (NIH): 142 mg/dL — ABNORMAL HIGH (ref 0–99)
Triglycerides: 117 mg/dL (ref 0–149)
VLDL Cholesterol Cal: 21 mg/dL (ref 5–40)

## 2019-04-02 LAB — COMPREHENSIVE METABOLIC PANEL
ALT: 21 IU/L (ref 0–44)
AST: 28 IU/L (ref 0–40)
Albumin/Globulin Ratio: 1.7 (ref 1.2–2.2)
Albumin: 4.7 g/dL (ref 3.7–4.7)
Alkaline Phosphatase: 65 IU/L (ref 39–117)
BUN/Creatinine Ratio: 13 (ref 10–24)
BUN: 14 mg/dL (ref 8–27)
Bilirubin Total: 0.9 mg/dL (ref 0.0–1.2)
CO2: 23 mmol/L (ref 20–29)
Calcium: 9.9 mg/dL (ref 8.6–10.2)
Chloride: 102 mmol/L (ref 96–106)
Creatinine, Ser: 1.11 mg/dL (ref 0.76–1.27)
GFR calc Af Amer: 76 mL/min/{1.73_m2} (ref >59)
GFR calc non Af Amer: 66 mL/min/{1.73_m2} (ref >59)
Globulin, Total: 2.8 g/dL (ref 1.5–4.5)
Glucose: 114 mg/dL — ABNORMAL HIGH (ref 65–99)
Potassium: 4.5 mmol/L (ref 3.5–5.2)
Sodium: 140 mmol/L (ref 134–144)
Total Protein: 7.5 g/dL (ref 6.0–8.5)

## 2019-04-02 LAB — TSH: TSH: 2.14 u[IU]/mL (ref 0.450–4.500)

## 2019-04-03 LAB — UA/M W/RFLX CULTURE, ROUTINE
Bilirubin, UA: NEGATIVE
Glucose, UA: NEGATIVE
Nitrite, UA: NEGATIVE
RBC, UA: NEGATIVE
Specific Gravity, UA: 1.02 (ref 1.005–1.030)
Urobilinogen, Ur: 2 mg/dL — ABNORMAL HIGH (ref 0.2–1.0)
pH, UA: 5.5 (ref 5.0–7.5)

## 2019-04-03 LAB — URINE CULTURE, REFLEX: Organism ID, Bacteria: NO GROWTH

## 2019-04-03 LAB — MICROALBUMIN, URINE WAIVED
Creatinine, Urine Waived: 300 mg/dL (ref 10–300)
Microalb, Ur Waived: 80 mg/L — ABNORMAL HIGH (ref 0–19)

## 2019-04-03 LAB — MICROSCOPIC EXAMINATION
Bacteria, UA: NONE SEEN
RBC, Urine: NONE SEEN /hpf (ref 0–2)

## 2019-07-10 DIAGNOSIS — H40023 Open angle with borderline findings, high risk, bilateral: Secondary | ICD-10-CM | POA: Diagnosis not present

## 2019-07-10 DIAGNOSIS — H524 Presbyopia: Secondary | ICD-10-CM | POA: Diagnosis not present

## 2019-07-15 DIAGNOSIS — R69 Illness, unspecified: Secondary | ICD-10-CM | POA: Diagnosis not present

## 2019-08-27 DIAGNOSIS — Z79899 Other long term (current) drug therapy: Secondary | ICD-10-CM | POA: Diagnosis not present

## 2019-08-27 DIAGNOSIS — N401 Enlarged prostate with lower urinary tract symptoms: Secondary | ICD-10-CM | POA: Diagnosis not present

## 2019-08-27 DIAGNOSIS — R972 Elevated prostate specific antigen [PSA]: Secondary | ICD-10-CM | POA: Diagnosis not present

## 2019-08-27 DIAGNOSIS — Z125 Encounter for screening for malignant neoplasm of prostate: Secondary | ICD-10-CM | POA: Diagnosis not present

## 2019-08-27 DIAGNOSIS — N5201 Erectile dysfunction due to arterial insufficiency: Secondary | ICD-10-CM | POA: Diagnosis not present

## 2019-09-25 ENCOUNTER — Other Ambulatory Visit: Payer: Self-pay | Admitting: Family Medicine

## 2019-09-25 DIAGNOSIS — I10 Essential (primary) hypertension: Secondary | ICD-10-CM

## 2019-10-08 ENCOUNTER — Encounter: Payer: Self-pay | Admitting: Family Medicine

## 2019-10-08 ENCOUNTER — Other Ambulatory Visit: Payer: Self-pay

## 2019-10-08 ENCOUNTER — Ambulatory Visit (INDEPENDENT_AMBULATORY_CARE_PROVIDER_SITE_OTHER): Payer: Medicare HMO | Admitting: Family Medicine

## 2019-10-08 VITALS — BP 103/67 | HR 75 | Temp 98.0°F | Ht 71.65 in | Wt 205.6 lb

## 2019-10-08 DIAGNOSIS — I10 Essential (primary) hypertension: Secondary | ICD-10-CM

## 2019-10-08 DIAGNOSIS — Z818 Family history of other mental and behavioral disorders: Secondary | ICD-10-CM

## 2019-10-08 DIAGNOSIS — R69 Illness, unspecified: Secondary | ICD-10-CM | POA: Diagnosis not present

## 2019-10-08 MED ORDER — BENAZEPRIL HCL 40 MG PO TABS: 40.0000 mg | ORAL_TABLET | Freq: Every day | ORAL | 1 refills | Status: DC

## 2019-10-08 MED ORDER — BENAZEPRIL HCL 40 MG PO TABS: 20.0000 mg | ORAL_TABLET | Freq: Every day | ORAL | 1 refills | Status: DC

## 2019-10-08 MED ORDER — AMLODIPINE BESYLATE 10 MG PO TABS: 5.0000 mg | ORAL_TABLET | Freq: Every day | ORAL | 1 refills | Status: DC

## 2019-10-08 NOTE — Assessment & Plan Note (Signed)
Running a bit low. Cut amlodipine in 1/2 and continue benazepril. Recheck 1 month. BMP checked today. Call with any concerns.

## 2019-10-08 NOTE — Progress Notes (Signed)
BP 103/67 (BP Location: Left Arm, Patient Position: Sitting, Cuff Size: Normal)   Pulse 75   Temp 98 F (36.7 C) (Oral)   Ht 5' 11.65" (1.82 m)   Wt 205 lb 9.6 oz (93.3 kg)   SpO2 98%   BMI 28.15 kg/m    Subjective:    Patient ID: Christopher Henderson, male    DOB: [birth year], 74 y.o.   MRN: [id number]  HPI: Christopher Henderson is a 74 y.o. male  Chief Complaint  Patient presents with  . Hypertension  . Dementia    concerns    HYPERTENSION Hypertension status: overtreated  Satisfied with current treatment? yes Duration of hypertension: chronic BP monitoring frequency:  not checking BP medication side effects:  no Medication compliance: excellent compliance Previous BP meds: benazepril Aspirin: yes Recurrent headaches: no Visual changes: no Palpitations: no Dyspnea: no Chest pain: no Lower extremity edema: no Dizzy/lightheaded: no   He notes that his sister and another relative had dementia and he has been wondering if there are any tests he should do to get checked. He has not had any symptoms. He has not been forgetting anything.   Relevant past medical, surgical, family and social history reviewed and updated as indicated. Interim medical history since our last visit reviewed. Allergies and medications reviewed and updated.  Review of Systems  Constitutional: Negative.   Respiratory: Negative.   Cardiovascular: Negative.   Gastrointestinal: Negative.   Musculoskeletal: Negative.   Neurological: Negative.   Psychiatric/Behavioral: Negative.     Per HPI unless specifically indicated above     Objective:    BP 103/67 (BP Location: Left Arm, Patient Position: Sitting, Cuff Size: Normal)   Pulse 75   Temp 98 F (36.7 C) (Oral)   Ht 5' 11.65" (1.82 m)   Wt 205 lb 9.6 oz (93.3 kg)   SpO2 98%   BMI 28.15 kg/m   Wt Readings from Last 3 Encounters:  10/08/19 205 lb 9.6 oz (93.3 kg)  04/01/19 206 lb 2 oz (93.5 kg)  12/31/18 216 lb 0.8 oz (98 kg)    Physical  Exam Vitals and nursing note reviewed.  Constitutional:      General: He is not in acute distress.    Appearance: Normal appearance. He is not ill-appearing, toxic-appearing or diaphoretic.  HENT:     Head: Normocephalic and atraumatic.     Right Ear: External ear normal.     Left Ear: External ear normal.     Nose: Nose normal.     Mouth/Throat:     Mouth: Mucous membranes are moist.     Pharynx: Oropharynx is clear.  Eyes:     General: No scleral icterus.       Right eye: No discharge.        Left eye: No discharge.     Extraocular Movements: Extraocular movements intact.     Conjunctiva/sclera: Conjunctivae normal.     Pupils: Pupils are equal, round, and reactive to light.  Cardiovascular:     Rate and Rhythm: Normal rate and regular rhythm.     Pulses: Normal pulses.     Heart sounds: Normal heart sounds. No murmur heard.  No friction rub. No gallop.   Pulmonary:     Effort: Pulmonary effort is normal. No respiratory distress.     Breath sounds: Normal breath sounds. No stridor. No wheezing, rhonchi or rales.  Chest:     Chest wall: No tenderness.  Musculoskeletal:  General: Normal range of motion.     Cervical back: Normal range of motion and neck supple.  Skin:    General: Skin is warm and dry.     Capillary Refill: Capillary refill takes less than 2 seconds.     Coloration: Skin is not jaundiced or pale.     Findings: No bruising, erythema, lesion or rash.  Neurological:     General: No focal deficit present.     Mental Status: He is alert and oriented to person, place, and time. Mental status is at baseline.  Psychiatric:        Mood and Affect: Mood normal.        Behavior: Behavior normal.        Thought Content: Thought content normal.        Judgment: Judgment normal.     Results for orders placed or performed in visit on 04/01/19  Microscopic Examination   URINE  Result Value Ref Range   WBC, UA 6-10 (A) 0 - 5 /hpf   RBC None seen 0 - 2 /hpf     Epithelial Cells (non renal) 0-10 0 - 10 /hpf   Casts Present None seen /lpf   Cast Type Granular casts (A) N/A   Crystals Present N/A   Crystal Type Calcium Oxalate N/A   Bacteria, UA None seen None seen/Few  Urine Culture, Reflex   URINE  Result Value Ref Range   Urine Culture, Routine Final report    Organism ID, Bacteria No growth   CBC with Differential OUT  Result Value Ref Range   WBC 6.1 3.4 - 10.8 x10E3/uL   RBC 5.17 4.14 - 5.80 x10E6/uL   Hemoglobin 15.7 13.0 - 17.7 g/dL   Hematocrit 48.4 37.5 - 51.0 %   MCV 94 79 - 97 fL   MCH 30.4 26.6 - 33.0 pg   MCHC 32.4 31 - 35 g/dL   RDW 12.4 11.6 - 15.4 %   Platelets 226 150 - 450 x10E3/uL   Neutrophils 64 Not Estab. %   Lymphs 25 Not Estab. %   Monocytes 7 Not Estab. %   Eos 3 Not Estab. %   Basos 1 Not Estab. %   Neutrophils Absolute 4.0 1 - 7 x10E3/uL   Lymphocytes Absolute 1.5 0 - 3 x10E3/uL   Monocytes Absolute 0.4 0 - 0 x10E3/uL   EOS (ABSOLUTE) 0.2 0.0 - 0.4 x10E3/uL   Basophils Absolute 0.0 0 - 0 x10E3/uL   Immature Granulocytes 0 Not Estab. %   Immature Grans (Abs) 0.0 0.0 - 0.1 x10E3/uL  Comp Met (CMET)  Result Value Ref Range   Glucose 114 (H) 65 - 99 mg/dL   BUN 14 8 - 27 mg/dL   Creatinine, Ser 1.11 0.76 - 1.27 mg/dL   GFR calc non Af Amer 66 >59 mL/min/1.73   GFR calc Af Amer 76 >59 mL/min/1.73   BUN/Creatinine Ratio 13 10 - 24   Sodium 140 134 - 144 mmol/L   Potassium 4.5 3.5 - 5.2 mmol/L   Chloride 102 96 - 106 mmol/L   CO2 23 20 - 29 mmol/L   Calcium 9.9 8.6 - 10.2 mg/dL   Total Protein 7.5 6.0 - 8.5 g/dL   Albumin 4.7 3.7 - 4.7 g/dL   Globulin, Total 2.8 1.5 - 4.5 g/dL   Albumin/Globulin Ratio 1.7 1.2 - 2.2   Bilirubin Total 0.9 0.0 - 1.2 mg/dL   Alkaline Phosphatase 65 39 - 117 IU/L   AST 28  0 - 40 IU/L   ALT 21 0 - 44 IU/L  Lipid Panel w/o Chol/HDL Ratio OUT  Result Value Ref Range   Cholesterol, Total 206 (H) 100 - 199 mg/dL   Triglycerides 117 0 - 149 mg/dL   HDL 43 >39 mg/dL    VLDL Cholesterol Cal 21 5 - 40 mg/dL   LDL Chol Calc (NIH) 142 (H) 0 - 99 mg/dL  Microalbumin, Urine Waived  Result Value Ref Range   Microalb, Ur Waived 80 (H) 0 - 19 mg/L   Creatinine, Urine Waived 300 10 - 300 mg/dL   Microalb/Creat Ratio 30-300 (H) <30 mg/g  TSH  Result Value Ref Range   TSH 2.140 0.450 - 4.500 uIU/mL  UA/M w/rflx Culture, Routine   Specimen: Urine   URINE  Result Value Ref Range   Specific Gravity, UA 1.020 1.005 - 1.030   pH, UA 5.5 5.0 - 7.5   Color, UA Yellow Yellow   Appearance Ur Hazy (A) Clear   Leukocytes,UA Trace (A) Negative   Protein,UA 2+ (A) Negative/Trace   Glucose, UA Negative Negative   Ketones, UA 1+ (A) Negative   RBC, UA Negative Negative   Bilirubin, UA Negative Negative   Urobilinogen, Ur 2.0 (H) 0.2 - 1.0 mg/dL   Nitrite, UA Negative Negative   Microscopic Examination See below:    Urinalysis Reflex Comment       Assessment & Plan:   Problem List Items Addressed This Visit      Cardiovascular and Mediastinum   Essential hypertension - Primary    Running a bit low. Cut amlodipine in 1/2 and continue benazepril. Recheck 1 month. BMP checked today. Call with any concerns.       Relevant Medications   amLODipine (NORVASC) 10 MG tablet   benazepril (LOTENSIN) 40 MG tablet   Other Relevant Orders   Basic metabolic panel    Other Visit Diagnoses    Family history of dementia       Reassured patient. Last 6CIT normal. No symptoms. Continue brain work and keep BP under good control. Call with any concerns.        Follow up plan: Return in about 4 weeks (around 11/05/2019).

## 2019-10-09 LAB — BASIC METABOLIC PANEL
BUN/Creatinine Ratio: 12 (ref 10–24)
BUN: 16 mg/dL (ref 8–27)
CO2: 23 mmol/L (ref 20–29)
Calcium: 9.5 mg/dL (ref 8.6–10.2)
Chloride: 103 mmol/L (ref 96–106)
Creatinine, Ser: 1.36 mg/dL — ABNORMAL HIGH (ref 0.76–1.27)
GFR calc Af Amer: 59 mL/min/{1.73_m2} — ABNORMAL LOW (ref >59)
GFR calc non Af Amer: 51 mL/min/{1.73_m2} — ABNORMAL LOW (ref >59)
Glucose: 111 mg/dL — ABNORMAL HIGH (ref 65–99)
Potassium: 4.7 mmol/L (ref 3.5–5.2)
Sodium: 139 mmol/L (ref 134–144)

## 2019-10-11 ENCOUNTER — Other Ambulatory Visit: Payer: Self-pay | Admitting: Family Medicine

## 2019-10-11 DIAGNOSIS — N289 Disorder of kidney and ureter, unspecified: Secondary | ICD-10-CM

## 2019-10-16 ENCOUNTER — Other Ambulatory Visit: Payer: Self-pay

## 2019-10-16 ENCOUNTER — Ambulatory Visit (INDEPENDENT_AMBULATORY_CARE_PROVIDER_SITE_OTHER): Payer: Medicare HMO

## 2019-10-16 ENCOUNTER — Encounter: Payer: Self-pay | Admitting: Family Medicine

## 2019-10-16 DIAGNOSIS — N289 Disorder of kidney and ureter, unspecified: Secondary | ICD-10-CM

## 2019-10-16 NOTE — Progress Notes (Signed)
Did not need appointment today. Only labs. Appt. Canceled.

## 2019-10-17 LAB — BASIC METABOLIC PANEL
BUN/Creatinine Ratio: 11 (ref 10–24)
BUN: 11 mg/dL (ref 8–27)
CO2: 24 mmol/L (ref 20–29)
Calcium: 9.5 mg/dL (ref 8.6–10.2)
Chloride: 103 mmol/L (ref 96–106)
Creatinine, Ser: 1.01 mg/dL (ref 0.76–1.27)
GFR calc Af Amer: 85 mL/min/{1.73_m2} (ref >59)
GFR calc non Af Amer: 73 mL/min/{1.73_m2} (ref >59)
Glucose: 106 mg/dL — ABNORMAL HIGH (ref 65–99)
Potassium: 4.4 mmol/L (ref 3.5–5.2)
Sodium: 141 mmol/L (ref 134–144)

## 2019-11-07 ENCOUNTER — Encounter: Payer: Self-pay | Admitting: Family Medicine

## 2019-11-07 ENCOUNTER — Other Ambulatory Visit: Payer: Self-pay

## 2019-11-07 ENCOUNTER — Ambulatory Visit (INDEPENDENT_AMBULATORY_CARE_PROVIDER_SITE_OTHER): Payer: Medicare HMO | Admitting: Family Medicine

## 2019-11-07 VITALS — BP 130/75 | HR 65 | Temp 98.2°F | Wt 209.4 lb

## 2019-11-07 DIAGNOSIS — I10 Essential (primary) hypertension: Secondary | ICD-10-CM | POA: Diagnosis not present

## 2019-11-07 NOTE — Progress Notes (Signed)
BP 130/75   Pulse 65   Temp 98.2 F (36.8 C) (Oral)   Wt 209 lb 6.4 oz (95 kg)   SpO2 97%   BMI 28.67 kg/m    Subjective:    Patient ID: Christopher Henderson, male    DOB: 02-08-1946, 74 y.o.   MRN: [id number]  HPI: ASAAD Henderson is a 74 y.o. male  Chief Complaint  Patient presents with  . Hypertension   HYPERTENSION Hypertension status: better  Satisfied with current treatment? yes Duration of hypertension: chronic BP monitoring frequency:  not checking BP medication side effects:  no Medication compliance: excellent compliance Aspirin: no Recurrent headaches: no Visual changes: no Palpitations: no Dyspnea: no Chest pain: no Lower extremity edema: no Dizzy/lightheaded: no   Relevant past medical, surgical, family and social history reviewed and updated as indicated. Interim medical history since our last visit reviewed. Allergies and medications reviewed and updated.  Review of Systems  Constitutional: Negative.   Respiratory: Negative.   Cardiovascular: Negative.   Gastrointestinal: Negative.   Musculoskeletal: Negative.   Psychiatric/Behavioral: Negative.     Per HPI unless specifically indicated above     Objective:    BP 130/75   Pulse 65   Temp 98.2 F (36.8 C) (Oral)   Wt 209 lb 6.4 oz (95 kg)   SpO2 97%   BMI 28.67 kg/m   Wt Readings from Last 3 Encounters:  11/07/19 209 lb 6.4 oz (95 kg)  10/08/19 205 lb 9.6 oz (93.3 kg)  04/01/19 206 lb 2 oz (93.5 kg)    Physical Exam Vitals and nursing note reviewed.  Constitutional:      General: He is not in acute distress.    Appearance: Normal appearance. He is not ill-appearing, toxic-appearing or diaphoretic.  HENT:     Head: Normocephalic and atraumatic.     Right Ear: External ear normal.     Left Ear: External ear normal.     Nose: Nose normal.     Mouth/Throat:     Mouth: Mucous membranes are moist.     Pharynx: Oropharynx is clear.  Eyes:     General: No scleral icterus.       Right  eye: No discharge.        Left eye: No discharge.     Extraocular Movements: Extraocular movements intact.     Conjunctiva/sclera: Conjunctivae normal.     Pupils: Pupils are equal, round, and reactive to light.  Cardiovascular:     Rate and Rhythm: Normal rate and regular rhythm.     Pulses: Normal pulses.     Heart sounds: Normal heart sounds. No murmur heard.  No friction rub. No gallop.   Pulmonary:     Effort: Pulmonary effort is normal. No respiratory distress.     Breath sounds: Normal breath sounds. No stridor. No wheezing, rhonchi or rales.  Chest:     Chest wall: No tenderness.  Musculoskeletal:        General: Normal range of motion.     Cervical back: Normal range of motion and neck supple.  Skin:    General: Skin is warm and dry.     Capillary Refill: Capillary refill takes less than 2 seconds.     Coloration: Skin is not jaundiced or pale.     Findings: No bruising, erythema, lesion or rash.  Neurological:     General: No focal deficit present.     Mental Status: He is alert and oriented to person,  place, and time. Mental status is at baseline.  Psychiatric:        Mood and Affect: Mood normal.        Behavior: Behavior normal.        Thought Content: Thought content normal.        Judgment: Judgment normal.     Results for orders placed or performed in visit on 26/20/35  Basic metabolic panel  Result Value Ref Range   Glucose 106 (H) 65 - 99 mg/dL   BUN 11 8 - 27 mg/dL   Creatinine, Ser 1.01 0.76 - 1.27 mg/dL   GFR calc non Af Amer 73 >59 mL/min/1.73   GFR calc Af Amer 85 >59 mL/min/1.73   BUN/Creatinine Ratio 11 10 - 24   Sodium 141 134 - 144 mmol/L   Potassium 4.4 3.5 - 5.2 mmol/L   Chloride 103 96 - 106 mmol/L   CO2 24 20 - 29 mmol/L   Calcium 9.5 8.6 - 10.2 mg/dL      Assessment & Plan:   Problem List Items Addressed This Visit      Cardiovascular and Mediastinum   Essential hypertension - Primary    Doing well. BP not running too low now.  Continue current regimen. Continue to monitor. Refills up to date. Labs checked today.      Relevant Orders   Basic metabolic panel       Follow up plan: Return in about 6 months (around 05/09/2020).

## 2019-11-07 NOTE — Assessment & Plan Note (Signed)
Doing well. BP not running too low now. Continue current regimen. Continue to monitor. Refills up to date. Labs checked today.

## 2019-11-08 LAB — BASIC METABOLIC PANEL
BUN/Creatinine Ratio: 14 (ref 10–24)
BUN: 11 mg/dL (ref 8–27)
CO2: 24 mmol/L (ref 20–29)
Calcium: 9.5 mg/dL (ref 8.6–10.2)
Chloride: 104 mmol/L (ref 96–106)
Creatinine, Ser: 0.81 mg/dL (ref 0.76–1.27)
GFR calc Af Amer: 102 mL/min/{1.73_m2} (ref >59)
GFR calc non Af Amer: 88 mL/min/{1.73_m2} (ref >59)
Glucose: 119 mg/dL — ABNORMAL HIGH (ref 65–99)
Potassium: 4.3 mmol/L (ref 3.5–5.2)
Sodium: 141 mmol/L (ref 134–144)

## 2019-11-27 ENCOUNTER — Other Ambulatory Visit: Payer: Self-pay

## 2019-11-27 ENCOUNTER — Ambulatory Visit: Payer: Medicare HMO | Admitting: Dermatology

## 2019-11-27 DIAGNOSIS — L578 Other skin changes due to chronic exposure to nonionizing radiation: Secondary | ICD-10-CM | POA: Diagnosis not present

## 2019-11-27 DIAGNOSIS — D1800 Hemangioma unspecified site: Secondary | ICD-10-CM

## 2019-11-27 DIAGNOSIS — L821 Other seborrheic keratosis: Secondary | ICD-10-CM

## 2019-11-27 DIAGNOSIS — L905 Scar conditions and fibrosis of skin: Secondary | ICD-10-CM | POA: Diagnosis not present

## 2019-11-27 DIAGNOSIS — Z1283 Encounter for screening for malignant neoplasm of skin: Secondary | ICD-10-CM | POA: Diagnosis not present

## 2019-11-27 DIAGNOSIS — W57XXXA Bitten or stung by nonvenomous insect and other nonvenomous arthropods, initial encounter: Secondary | ICD-10-CM

## 2019-11-27 DIAGNOSIS — L814 Other melanin hyperpigmentation: Secondary | ICD-10-CM | POA: Diagnosis not present

## 2019-11-27 DIAGNOSIS — D229 Melanocytic nevi, unspecified: Secondary | ICD-10-CM | POA: Diagnosis not present

## 2019-11-27 DIAGNOSIS — L570 Actinic keratosis: Secondary | ICD-10-CM | POA: Diagnosis not present

## 2019-11-27 NOTE — Progress Notes (Signed)
   Follow-Up Visit   Subjective  Christopher Henderson is a 74 y.o. male who presents for the following: Annual Exam (no new or changing moles, lesions or spots. Hx of AK's and ISK's treated in the past). The patient presents for Total-Body Skin Exam (TBSE) for skin cancer screening and mole check.  The following portions of the chart were reviewed this encounter and updated as appropriate:  Tobacco  Allergies  Meds  Problems  Med Hx  Surg Hx  Fam Hx     Review of Systems:  No other skin or systemic complaints except as noted in HPI or Assessment and Plan.  Objective  Well appearing patient in no apparent distress; mood and affect are within normal limits.  A full examination was performed including scalp, head, eyes, ears, nose, lips, neck, chest, axillae, abdomen, back, buttocks, bilateral upper extremities, bilateral lower extremities, hands, feet, fingers, toes, fingernails, and toenails. All findings within normal limits unless otherwise noted below.  Objective  Face, R arm, and ears x 4 (4): Erythematous thin papules/macules with gritty scale.   Objective  R post auricular/mastoid area: Dyspigmented smooth macule or patch.   Objective  Back: Erythematous papule  Assessment & Plan  AK (actinic keratosis) (4) Face, R arm, and ears x 4  Destruction of lesion - Face, R arm, and ears x 4 Complexity: simple   Destruction method: cryotherapy   Informed consent: discussed and consent obtained   Timeout:  patient name, date of birth, surgical site, and procedure verified Lesion destroyed using liquid nitrogen: Yes   Region frozen until ice ball extended beyond lesion: Yes   Outcome: patient tolerated procedure well with no complications   Post-procedure details: wound care instructions given    Scar R post auricular/mastoid area Possible hx skin CA - pathology unavailable. Clear. Observe for recurrence. Call clinic for new or changing lesions.  Recommend regular skin  exams, daily broad-spectrum spf 30+ sunscreen use, and photoprotection.    Bug bite without infection, initial encounter Back Benign, observe.   Lentigines - Scattered tan macules - Discussed due to sun exposure - Benign, observe - Call for any changes  Seborrheic Keratoses - Stuck-on, waxy, tan-brown papules and plaques  - Discussed benign etiology and prognosis. - Observe - Call for any changes  Melanocytic Nevi - Tan-brown and/or pink-flesh-colored symmetric macules and papules - Benign appearing on exam today - Observation - Call clinic for new or changing moles - Recommend daily use of broad spectrum spf 30+ sunscreen to sun-exposed areas.   Hemangiomas - Red papules - Discussed benign nature - Observe - Call for any changes  Actinic Damage - diffuse scaly erythematous macules with underlying dyspigmentation - Recommend daily broad spectrum sunscreen SPF 30+ to sun-exposed areas, reapply every 2 hours as needed.  - Call for new or changing lesions.  Skin cancer screening performed today.  Return in about 1 year (around 11/26/2020) for TBSE.  Luther Redo, CMA, am acting as scribe for Sarina Ser, MD .  Documentation: I have reviewed the above documentation for accuracy and completeness, and I agree with the above.  Sarina Ser, MD

## 2019-12-02 ENCOUNTER — Encounter: Payer: Self-pay | Admitting: Dermatology

## 2019-12-26 DIAGNOSIS — R69 Illness, unspecified: Secondary | ICD-10-CM | POA: Diagnosis not present

## 2020-01-09 DIAGNOSIS — H40023 Open angle with borderline findings, high risk, bilateral: Secondary | ICD-10-CM | POA: Diagnosis not present

## 2020-01-11 DIAGNOSIS — Z20822 Contact with and (suspected) exposure to covid-19: Secondary | ICD-10-CM | POA: Diagnosis not present

## 2020-01-24 DIAGNOSIS — R69 Illness, unspecified: Secondary | ICD-10-CM | POA: Diagnosis not present

## 2020-04-06 ENCOUNTER — Ambulatory Visit (INDEPENDENT_AMBULATORY_CARE_PROVIDER_SITE_OTHER): Payer: Medicare HMO

## 2020-04-06 VITALS — BP 132/78 | HR 72 | Ht 71.0 in | Wt 208.0 lb

## 2020-04-06 DIAGNOSIS — Z0000 Encounter for general adult medical examination without abnormal findings: Secondary | ICD-10-CM | POA: Diagnosis not present

## 2020-04-06 NOTE — Progress Notes (Signed)
I connected with Christopher Henderson today by telephone and verified that I am speaking with the correct person using two identifiers. Location patient: home Location provider: work Persons participating in the virtual visit: Christopher Henderson, Glenna Durand LPN.   I discussed the limitations, risks, security and privacy concerns of performing an evaluation and management service by telephone and the availability of in person appointments. I also discussed with the patient that there may be a patient responsible charge related to this service. The patient expressed understanding and verbally consented to this telephonic visit.    Interactive audio and video telecommunications were attempted between this provider and patient, however failed, due to patient having technical difficulties OR patient did not have access to video capability.  We continued and completed visit with audio only.     Vital signs may be patient reported or missing.  Subjective:   Christopher Henderson is a 75 y.o. male who presents for Medicare Annual/Subsequent preventive examination.  Review of Systems     Cardiac Risk Factors include: advanced age (>47men, >109 women);hypertension;male gender     Objective:    Today's Vitals   04/06/20 1511  BP: 132/78  Pulse: 72  Weight: 208 lb (94.3 kg)  Height: 5\' 11"  (1.803 m)   Body mass index is 29.01 kg/m.  Advanced Directives 04/06/2020 12/31/2018 08/12/2016 06/23/2016 06/19/2012  Does Patient Have a Medical Advance Directive? Yes Yes Yes Yes Patient does not have advance directive  Type of Advance Directive [place];Living will [place];Living will [place];Living will [place];Living will -  Does patient want to make changes to medical advance directive? - No - Patient declined No - Patient declined - -  Copy of [place] in Chart? No - copy requested No - copy requested No - copy  requested No - copy requested -  Pre-existing out of facility DNR order (yellow form or pink MOST form) - - - - No    Current Medications (verified) Outpatient Encounter Medications as of 04/06/2020  Medication Sig  . amLODipine (NORVASC) 10 MG tablet Take 0.5 tablets (5 mg total) by mouth daily.  . Ascorbic Acid (VITAMIN C PO) Take by mouth.  Marland Kitchen aspirin 81 MG tablet Take 81 mg by mouth daily.  . benazepril (LOTENSIN) 40 MG tablet Take 1 tablet (40 mg total) by mouth daily.  Marland Kitchen dutasteride (AVODART) 0.5 MG capsule Take 0.5 mg by mouth every other day.  . Omega-3 Fatty Acids (FISH OIL) 1000 MG CAPS Take 1,000 mg by mouth 2 (two) times daily.  Marland Kitchen OVER THE COUNTER MEDICATION daily. Brain Awake  . sildenafil (VIAGRA) 100 MG tablet Take 100 mg by mouth daily as needed for erectile dysfunction.  . TURMERIC CURCUMIN PO Take 1,000 mg by mouth daily.  Marland Kitchen UNABLE TO FIND Eye Shield Plus Lutein  . pantoprazole (PROTONIX) 40 MG tablet Take 1 tablet (40 mg total) by mouth daily.   No facility-administered encounter medications on file as of 04/06/2020.    Allergies (verified) Penicillins and Shrimp [shellfish allergy]   History: Past Medical History:  Diagnosis Date  . Dental bridge present    also some implants  . Hypertension   . Senile purpura ([place]) 01/09/2018   Past Surgical History:  Procedure Laterality Date  . COLONOSCOPY WITH PROPOFOL N/A 12/31/2018   Procedure: COLONOSCOPY WITH BIOPSY;  Surgeon: Lucilla Lame, MD;  Location: [facility];  Service: Endoscopy;  Laterality: N/A;  .  ESOPHAGEAL DILATION  06/23/2016   Procedure: ESOPHAGEAL DILATION;  Surgeon: Lucilla Lame, MD;  Location: [facility];  Service: Endoscopy;;  . ESOPHAGEAL DILATION  08/12/2016   Procedure: ESOPHAGEAL DILATION;  Surgeon: Lucilla Lame, MD;  Location: [facility];  Service: Endoscopy;;  . ESOPHAGOGASTRODUODENOSCOPY (EGD) WITH PROPOFOL N/A 06/23/2016   Procedure: ESOPHAGOGASTRODUODENOSCOPY (EGD)  WITH PROPOFOL;  Surgeon: Lucilla Lame, MD;  Location: [facility];  Service: Endoscopy;  Laterality: N/A;  . ESOPHAGOGASTRODUODENOSCOPY (EGD) WITH PROPOFOL N/A 08/12/2016   Procedure: ESOPHAGOGASTRODUODENOSCOPY (EGD) WITH PROPOFOL;  Surgeon: Lucilla Lame, MD;  Location: [facility];  Service: Endoscopy;  Laterality: N/A;  . EYE SURGERY  2014   cataract Jan and Feb with implants  . HAND / FINGER TENDON LESION EXCISION Left 2000  . I & D EXTREMITY Left 06/18/2012   Procedure: Irrigation and Debridement with wound exploration Left Hand and percutaneous pinning of left Fifth finger and third metacarpal;  Surgeon: Schuyler Amor, MD;  Location: [facility];  Service: Orthopedics;  Laterality: Left;  . KNEE SURGERY Left 1972  . POLYPECTOMY N/A 12/31/2018   Procedure: POLYPECTOMY;  Surgeon: Lucilla Lame, MD;  Location: [facility];  Service: Endoscopy;  Laterality: N/A;   Family History  Problem Relation Age of Onset  . Hypertension Mother   . Diabetes Mother   . Thyroid disease Mother   . Glaucoma Father   . Cancer Sister        colon and breast  . Alzheimer's disease Sister   . Diabetes Brother   . Heart disease Brother   . Seizures Sister   . Dementia Sister    Social History   Socioeconomic History  . Marital status: Married    Spouse name: Not on file  . Number of children: Not on file  . Years of education: Not on file  . Highest education level: Not on file  Occupational History  . Occupation: retired  Tobacco Use  . Smoking status: Never Smoker  . Smokeless tobacco: Never Used  Vaping Use  . Vaping Use: Never used  Substance and Sexual Activity  . Alcohol use: No  . Drug use: No  . Sexual activity: Yes  Other Topics Concern  . Not on file  Social History Narrative  . Not on file   Social Determinants of Health   Financial Resource Strain: Low Risk   . Difficulty of Paying Living Expenses: Not hard at all  Food Insecurity: No Food Insecurity  .  Worried About Charity fundraiser in the Last Year: Never true  . Ran Out of Food in the Last Year: Never true  Transportation Needs: No Transportation Needs  . Lack of Transportation (Medical): No  . Lack of Transportation (Non-Medical): No  Physical Activity: Sufficiently Active  . Days of Exercise per Week: 5 days  . Minutes of Exercise per Session: 60 min  Stress: No Stress Concern Present  . Feeling of Stress : Not at all  Social Connections: Not on file    Tobacco Counseling Counseling given: Not Answered   Clinical Intake:  Pre-visit preparation completed: Yes  Pain : No/denies pain     Nutritional Status: BMI 25 -29 Overweight Nutritional Risks: None Diabetes: No  How often do you need to have someone help you when you read instructions, pamphlets, or other written materials from your doctor or pharmacy?: 1 - Never What is the last grade level you completed in school?: 2 yrs college  Diabetic? no  Interpreter Needed?: No  Information entered by :: NAllen LPN   Activities of Daily Living In your present state of health, do you have any difficulty performing the following activities: 04/06/2020 10/16/2019  Hearing? Y N  Vision? N N  Difficulty concentrating or making decisions? N N  Walking or climbing stairs? N N  Dressing or bathing? N N  Doing errands, shopping? N N  Preparing Food and eating ? N -  Using the Toilet? N -  In the past six months, have you accidently leaked urine? N -  Do you have problems with loss of bowel control? N -  Managing your Medications? N -  Managing your Finances? N -  Housekeeping or managing your Housekeeping? N -  Some recent data might be hidden    Patient Care Team: Valerie Roys, DO as PCP - General (Family Medicine)  Indicate any recent Medical Services you may have received from other than [facility] providers in the past year (date may be approximate).     Assessment:   This is a routine wellness examination for  Christopher Henderson.  Hearing/Vision screen  Hearing Screening   125Hz  250Hz  500Hz  1000Hz  2000Hz  3000Hz  4000Hz  6000Hz  8000Hz   Right ear:           Left ear:           Vision Screening Comments: Regular eye exams, Dr. Joya San  Dietary issues and exercise activities discussed: Current Exercise Habits: Home exercise routine, Type of exercise: walking, Time (Minutes): 60, Frequency (Times/Week): 5, Weekly Exercise (Minutes/Week): 300  Goals    . Patient Stated     04/06/2020, walk 10,000 steps a day      Depression Screen PHQ 2/9 Scores 04/06/2020 04/01/2019  PHQ - 2 Score 0 0  Some encounter information is confidential and restricted. Go to Review Flowsheets activity to see all data.    Fall Risk Fall Risk  04/06/2020 04/01/2019  Falls in the past year? 0 0  Number falls in past yr: - 0  Injury with Fall? - 0  Risk for fall due to : Medication side effect -  Follow up Falls evaluation completed;Education provided;Falls prevention discussed -  Some encounter information is confidential and restricted. Go to Review Flowsheets activity to see all data.    FALL RISK PREVENTION PERTAINING TO THE HOME:  Any stairs in or around the home? Yes  If so, are there any without handrails? No  Home free of loose throw rugs in walkways, pet beds, electrical cords, etc? Yes  Adequate lighting in your home to reduce risk of falls? Yes   ASSISTIVE DEVICES UTILIZED TO PREVENT FALLS:  Life alert? No  Use of a cane, walker or w/c? No  Grab bars in the bathroom? No  Shower chair or bench in shower? No  Elevated toilet seat or a handicapped toilet? Yes   TIMED UP AND GO:  Was the test performed? No .     Cognitive Function:     6CIT Screen 04/06/2020 04/01/2019  What Year? 0 points 0 points  What month? 0 points 0 points  What time? 0 points 0 points  Count back from 20 0 points 2 points  Months in reverse 0 points 0 points  Repeat phrase 0 points 0 points  Total Score 0 2     Immunizations Immunization History  Administered Date(s) Administered  . Influenza, High Dose Seasonal PF 12/19/2016, 12/28/2017  . Influenza,inj,Quad PF,6+ Mos 01/27/2019  . Influenza-Unspecified 12/18/2014  . PFIZER  SARS-COV-2 Vaccination 05/14/2019, 06/04/2019  . Pneumococcal Conjugate-13 09/26/2013  . Pneumococcal Polysaccharide-23 08/14/2012  . Td 03/28/2000  . Tdap 07/19/2010  . Zoster 05/28/2007    TDAP status: Up to date  Flu Vaccine status: Up to date  Pneumococcal vaccine status: Up to date  Covid-19 vaccine status: Completed vaccines  Qualifies for Shingles Vaccine? Yes   Zostavax completed Yes   Shingrix Completed?: No.    Education has been provided regarding the importance of this vaccine. Patient has been advised to call insurance company to determine out of pocket expense if they have not yet received this vaccine. Advised may also receive vaccine at local pharmacy or Health Dept. Verbalized acceptance and understanding.  Screening Tests Health Maintenance  Topic Date Due  . COVID-19 Vaccine (3 - Pfizer risk 4-dose series) 07/02/2019  . INFLUENZA VACCINE  10/27/2019  . TETANUS/TDAP  07/18/2020  . COLONOSCOPY (Pts 45-27yrs Insurance coverage will need to be confirmed)  12/31/2023  . Hepatitis C Screening  Completed  . PNA vac Low Risk Adult  Completed    Health Maintenance  Health Maintenance Due  Topic Date Due  . COVID-19 Vaccine (3 - Pfizer risk 4-dose series) 07/02/2019  . INFLUENZA VACCINE  10/27/2019    Colorectal cancer screening: Type of screening: Colonoscopy. Completed 12/31/2018. Repeat every 5 years  Lung Cancer Screening: (Low Dose CT Chest recommended if Age 61-80 years, 30 pack-year currently smoking OR have quit w/in 15years.) does not qualify.   Lung Cancer Screening Referral: no  Additional Screening:  Hepatitis C Screening: does qualify; Completed 06/19/2017  Vision Screening: Recommended annual ophthalmology exams for early  detection of glaucoma and other disorders of the eye. Is the patient up to date with their annual eye exam?  Yes  Who is the provider or what is the name of the office in which the patient attends annual eye exams? Dr. Joya San If pt is not established with a provider, would they like to be referred to a provider to establish care? No .   Dental Screening: Recommended annual dental exams for proper oral hygiene  Community Resource Referral / Chronic Care Management: CRR required this visit?  No   CCM required this visit?  No      Plan:     I have personally reviewed and noted the following in the patient's chart:   . Medical and social history . Use of alcohol, tobacco or illicit drugs  . Current medications and supplements . Functional ability and status . Nutritional status . Physical activity . Advanced directives . List of other physicians . Hospitalizations, surgeries, and ER visits in previous 12 months . Vitals . Screenings to include cognitive, depression, and falls . Referrals and appointments  In addition, I have reviewed and discussed with patient certain preventive protocols, quality metrics, and best practice recommendations. A written personalized care plan for preventive services as well as general preventive health recommendations were provided to patient.     Kellie Simmering, LPN   [id number]   Nurse Notes:

## 2020-04-06 NOTE — Patient Instructions (Signed)
Christopher Henderson , Thank you for taking time to come for your Medicare Wellness Visit. I appreciate your ongoing commitment to your health goals. Please review the following plan we discussed and let me know if I can assist you in the future.   Screening recommendations/referrals: Colonoscopy: completed 12/31/2018, due 12/31/2023 Recommended yearly ophthalmology/optometry visit for glaucoma screening and checkup Recommended yearly dental visit for hygiene and checkup  Vaccinations: Influenza vaccine: completed 12/2019 per patient Pneumococcal vaccine: completed 09/26/2013 Tdap vaccine: completed 07/19/2010, due 07/18/2020 Shingles vaccine: discussed   Covid-19:  01/09/2020, 06/04/2019, 05/14/2019  Advanced directives: Please bring a copy of your POA (Power of Attorney) and/or Living Will to your next appointment.   Conditions/risks identified: none  Next appointment: Follow up in one year for your annual wellness visit.   Preventive Care 75 Years and Older, Male Preventive care refers to lifestyle choices and visits with your health care provider that can promote health and wellness. What does preventive care include?  A yearly physical exam. This is also called an annual well check.  Dental exams once or twice a year.  Routine eye exams. Ask your health care provider how often you should have your eyes checked.  Personal lifestyle choices, including:  Daily care of your teeth and gums.  Regular physical activity.  Eating a healthy diet.  Avoiding tobacco and drug use.  Limiting alcohol use.  Practicing safe sex.  Taking low doses of aspirin every day.  Taking vitamin and mineral supplements as recommended by your health care provider. What happens during an annual well check? The services and screenings done by your health care provider during your annual well check will depend on your age, overall health, lifestyle risk factors, and family history of disease. Counseling  Your  health care provider may ask you questions about your:  Alcohol use.  Tobacco use.  Drug use.  Emotional well-being.  Home and relationship well-being.  Sexual activity.  Eating habits.  History of falls.  Memory and ability to understand (cognition).  Work and work Statistician. Screening  You may have the following tests or measurements:  Height, weight, and BMI.  Blood pressure.  Lipid and cholesterol levels. These may be checked every 5 years, or more frequently if you are over 29 years old.  Skin check.  Lung cancer screening. You may have this screening every year starting at age 56 if you have a 30-pack-year history of smoking and currently smoke or have quit within the past 15 years.  Fecal occult blood test (FOBT) of the stool. You may have this test every year starting at age 68.  Flexible sigmoidoscopy or colonoscopy. You may have a sigmoidoscopy every 5 years or a colonoscopy every 10 years starting at age 43.  Prostate cancer screening. Recommendations will vary depending on your family history and other risks.  Hepatitis C blood test.  Hepatitis B blood test.  Sexually transmitted disease (STD) testing.  Diabetes screening. This is done by checking your blood sugar (glucose) after you have not eaten for a while (fasting). You may have this done every 1-3 years.  Abdominal aortic aneurysm (AAA) screening. You may need this if you are a current or former smoker.  Osteoporosis. You may be screened starting at age 69 if you are at high risk. Talk with your health care provider about your test results, treatment options, and if necessary, the need for more tests. Vaccines  Your health care provider may recommend certain vaccines, such as:  Influenza  vaccine. This is recommended every year.  Tetanus, diphtheria, and acellular pertussis (Tdap, Td) vaccine. You may need a Td booster every 10 years.  Zoster vaccine. You may need this after age  23.  Pneumococcal 13-valent conjugate (PCV13) vaccine. One dose is recommended after age 1.  Pneumococcal polysaccharide (PPSV23) vaccine. One dose is recommended after age 74. Talk to your health care provider about which screenings and vaccines you need and how often you need them. This information is not intended to replace advice given to you by your health care provider. Make sure you discuss any questions you have with your health care provider. Document Released: 04/10/2015 Document Revised: 12/02/2015 Document Reviewed: 01/13/2015 Elsevier Interactive Patient Education  2017 [place] Prevention in the Home Falls can cause injuries. They can happen to people of all ages. There are many things you can do to make your home safe and to help prevent falls. What can I do on the outside of my home?  Regularly fix the edges of walkways and driveways and fix any cracks.  Remove anything that might make you trip as you walk through a door, such as a raised step or threshold.  Trim any bushes or trees on the path to your home.  Use bright outdoor lighting.  Clear any walking paths of anything that might make someone trip, such as rocks or tools.  Regularly check to see if handrails are loose or broken. Make sure that both sides of any steps have handrails.  Any raised decks and porches should have guardrails on the edges.  Have any leaves, snow, or ice cleared regularly.  Use sand or salt on walking paths during winter.  Clean up any spills in your garage right away. This includes oil or grease spills. What can I do in the bathroom?  Use night lights.  Install grab bars by the toilet and in the tub and shower. Do not use towel bars as grab bars.  Use non-skid mats or decals in the tub or shower.  If you need to sit down in the shower, use a plastic, non-slip stool.  Keep the floor dry. Clean up any water that spills on the floor as soon as it happens.  Remove  soap buildup in the tub or shower regularly.  Attach bath mats securely with double-sided non-slip rug tape.  Do not have throw rugs and other things on the floor that can make you trip. What can I do in the bedroom?  Use night lights.  Make sure that you have a light by your bed that is easy to reach.  Do not use any sheets or blankets that are too big for your bed. They should not hang down onto the floor.  Have a firm chair that has side arms. You can use this for support while you get dressed.  Do not have throw rugs and other things on the floor that can make you trip. What can I do in the kitchen?  Clean up any spills right away.  Avoid walking on wet floors.  Keep items that you use a lot in easy-to-reach places.  If you need to reach something above you, use a strong step stool that has a grab bar.  Keep electrical cords out of the way.  Do not use floor polish or wax that makes floors slippery. If you must use wax, use non-skid floor wax.  Do not have throw rugs and other things on the floor that can  make you trip. What can I do with my stairs?  Do not leave any items on the stairs.  Make sure that there are handrails on both sides of the stairs and use them. Fix handrails that are broken or loose. Make sure that handrails are as long as the stairways.  Check any carpeting to make sure that it is firmly attached to the stairs. Fix any carpet that is loose or worn.  Avoid having throw rugs at the top or bottom of the stairs. If you do have throw rugs, attach them to the floor with carpet tape.  Make sure that you have a light switch at the top of the stairs and the bottom of the stairs. If you do not have them, ask someone to add them for you. What else can I do to help prevent falls?  Wear shoes that:  Do not have high heels.  Have rubber bottoms.  Are comfortable and fit you well.  Are closed at the toe. Do not wear sandals.  If you use a  stepladder:  Make sure that it is fully opened. Do not climb a closed stepladder.  Make sure that both sides of the stepladder are locked into place.  Ask someone to hold it for you, if possible.  Clearly mark and make sure that you can see:  Any grab bars or handrails.  First and last steps.  Where the edge of each step is.  Use tools that help you move around (mobility aids) if they are needed. These include:  Canes.  Walkers.  Scooters.  Crutches.  Turn on the lights when you go into a dark area. Replace any light bulbs as soon as they burn out.  Set up your furniture so you have a clear path. Avoid moving your furniture around.  If any of your floors are uneven, fix them.  If there are any pets around you, be aware of where they are.  Review your medicines with your doctor. Some medicines can make you feel dizzy. This can increase your chance of falling. Ask your doctor what other things that you can do to help prevent falls. This information is not intended to replace advice given to you by your health care provider. Make sure you discuss any questions you have with your health care provider. Document Released: 01/08/2009 Document Revised: 08/20/2015 Document Reviewed: 04/18/2014 Elsevier Interactive Patient Education  2017 [employer].

## 2020-04-07 ENCOUNTER — Other Ambulatory Visit: Payer: Self-pay | Admitting: Family Medicine

## 2020-04-07 DIAGNOSIS — I10 Essential (primary) hypertension: Secondary | ICD-10-CM

## 2020-04-07 MED ORDER — BENAZEPRIL HCL 40 MG PO TABS: 40.0000 mg | ORAL_TABLET | Freq: Every day | ORAL | 0 refills | Status: DC

## 2020-04-07 MED ORDER — AMLODIPINE BESYLATE 10 MG PO TABS: 5.0000 mg | ORAL_TABLET | Freq: Every day | ORAL | 0 refills | Status: DC

## 2020-04-07 NOTE — Telephone Encounter (Signed)
Medication Refill - Medication: benazepril (LOTENSIN) 40 MG tablet amLODipine (NORVASC) 10 MG tablet    Has the patient contacted their pharmacy? Yes.   (Agent: If no, request that the patient contact the pharmacy for the refill.) (Agent: If yes, when and what did the pharmacy advise?)  Preferred Pharmacy (with phone number or street name):  [facility], [place]  [address] [place] [address]  Phone: [phone] Fax: [phone]     Agent: Please be advised that RX refills may take up to 3 business days. We ask that you follow-up with your pharmacy.

## 2020-04-07 NOTE — Telephone Encounter (Signed)
Future appt in 1 year

## 2020-04-14 ENCOUNTER — Other Ambulatory Visit: Payer: Self-pay

## 2020-04-14 DIAGNOSIS — I10 Essential (primary) hypertension: Secondary | ICD-10-CM

## 2020-04-14 MED ORDER — BENAZEPRIL HCL 40 MG PO TABS: 40.0000 mg | ORAL_TABLET | Freq: Every day | ORAL | 0 refills | Status: DC

## 2020-04-14 MED ORDER — AMLODIPINE BESYLATE 10 MG PO TABS: 5.0000 mg | ORAL_TABLET | Freq: Every day | ORAL | 0 refills | Status: DC

## 2020-04-14 NOTE — Telephone Encounter (Signed)
RX sent in last week but it said no print. Can we resend please? Updated to normal.

## 2020-04-14 NOTE — Telephone Encounter (Signed)
These were both already sent to [facility] this morning

## 2020-04-14 NOTE — Telephone Encounter (Signed)
Please send to [facility]

## 2020-04-20 ENCOUNTER — Other Ambulatory Visit: Payer: Self-pay

## 2020-04-20 ENCOUNTER — Ambulatory Visit: Payer: Medicare HMO | Admitting: Dermatology

## 2020-04-20 DIAGNOSIS — L578 Other skin changes due to chronic exposure to nonionizing radiation: Secondary | ICD-10-CM | POA: Diagnosis not present

## 2020-04-20 DIAGNOSIS — L821 Other seborrheic keratosis: Secondary | ICD-10-CM

## 2020-04-20 DIAGNOSIS — L820 Inflamed seborrheic keratosis: Secondary | ICD-10-CM | POA: Diagnosis not present

## 2020-04-20 DIAGNOSIS — L570 Actinic keratosis: Secondary | ICD-10-CM | POA: Diagnosis not present

## 2020-04-20 NOTE — Progress Notes (Signed)
   Follow-Up Visit   Subjective  Christopher Henderson is a 75 y.o. male who presents for the following: Other (Few spots of face that are scaly).  The following portions of the chart were reviewed this encounter and updated as appropriate:   Tobacco  Allergies  Meds  Problems  Med Hx  Surg Hx  Fam Hx     Review of Systems:  No other skin or systemic complaints except as noted in HPI or Assessment and Plan.  Objective  Well appearing patient in no apparent distress; mood and affect are within normal limits.  A focused examination was performed including face. Relevant physical exam findings are noted in the Assessment and Plan.  Objective  Left forehead x 1, left cheek x 1, left preauricular x 1, left hand x 1 (3): Erythematous keratotic or waxy stuck-on papule or plaque.   Objective  Right Temple (3): Erythematous thin papules/macules with gritty scale.    Assessment & Plan    Actinic Damage - chronic, secondary to cumulative UV radiation exposure/sun exposure over time - diffuse scaly erythematous macules with underlying dyspigmentation - Recommend daily broad spectrum sunscreen SPF 30+ to sun-exposed areas, reapply every 2 hours as needed.  - Call for new or changing lesions.  Seborrheic Keratoses - Stuck-on, waxy, tan-brown papules and plaques  - Discussed benign etiology and prognosis. - Observe - Call for any changes  Inflamed seborrheic keratosis (3) Left forehead x 1, left cheek x 1, left preauricular x 1, left hand x 1  Destruction of lesion - Left forehead x 1, left cheek x 1, left preauricular x 1, left hand x 1 Complexity: simple   Destruction method: cryotherapy   Informed consent: discussed and consent obtained   Timeout:  patient name, date of birth, surgical site, and procedure verified Lesion destroyed using liquid nitrogen: Yes   Region frozen until ice ball extended beyond lesion: Yes   Outcome: patient tolerated procedure well with no  complications   Post-procedure details: wound care instructions given    AK (actinic keratosis) (3) Right Temple  Destruction of lesion - Right Temple Complexity: simple   Destruction method: cryotherapy   Informed consent: discussed and consent obtained   Timeout:  patient name, date of birth, surgical site, and procedure verified Lesion destroyed using liquid nitrogen: Yes   Region frozen until ice ball extended beyond lesion: Yes   Outcome: patient tolerated procedure well with no complications   Post-procedure details: wound care instructions given    Return for Follow up as scheduled.  I, Ashok Cordia, CMA, am acting as scribe for Sarina Ser, MD .  Documentation: I have reviewed the above documentation for accuracy and completeness, and I agree with the above.  Sarina Ser, MD

## 2020-04-20 NOTE — Patient Instructions (Signed)

## 2020-04-21 ENCOUNTER — Encounter: Payer: Self-pay | Admitting: Dermatology

## 2020-06-04 DIAGNOSIS — Z20822 Contact with and (suspected) exposure to covid-19: Secondary | ICD-10-CM | POA: Diagnosis not present

## 2020-07-13 DIAGNOSIS — H52223 Regular astigmatism, bilateral: Secondary | ICD-10-CM | POA: Diagnosis not present

## 2020-07-13 DIAGNOSIS — H40023 Open angle with borderline findings, high risk, bilateral: Secondary | ICD-10-CM | POA: Diagnosis not present

## 2020-07-20 ENCOUNTER — Ambulatory Visit (INDEPENDENT_AMBULATORY_CARE_PROVIDER_SITE_OTHER): Payer: Medicare HMO | Admitting: Family Medicine

## 2020-07-20 ENCOUNTER — Encounter: Payer: Self-pay | Admitting: Family Medicine

## 2020-07-20 ENCOUNTER — Other Ambulatory Visit: Payer: Self-pay

## 2020-07-20 VITALS — BP 100/66 | HR 74 | Temp 97.7°F | Ht 71.0 in | Wt 207.4 lb

## 2020-07-20 DIAGNOSIS — Z13220 Encounter for screening for lipoid disorders: Secondary | ICD-10-CM | POA: Diagnosis not present

## 2020-07-20 DIAGNOSIS — S81801A Unspecified open wound, right lower leg, initial encounter: Secondary | ICD-10-CM | POA: Diagnosis not present

## 2020-07-20 DIAGNOSIS — N138 Other obstructive and reflux uropathy: Secondary | ICD-10-CM | POA: Diagnosis not present

## 2020-07-20 DIAGNOSIS — D692 Other nonthrombocytopenic purpura: Secondary | ICD-10-CM

## 2020-07-20 DIAGNOSIS — N401 Enlarged prostate with lower urinary tract symptoms: Secondary | ICD-10-CM | POA: Diagnosis not present

## 2020-07-20 DIAGNOSIS — Z23 Encounter for immunization: Secondary | ICD-10-CM

## 2020-07-20 DIAGNOSIS — I10 Essential (primary) hypertension: Secondary | ICD-10-CM | POA: Diagnosis not present

## 2020-07-20 DIAGNOSIS — Z0000 Encounter for general adult medical examination without abnormal findings: Secondary | ICD-10-CM | POA: Diagnosis not present

## 2020-07-20 LAB — URINALYSIS, ROUTINE W REFLEX MICROSCOPIC
Bilirubin, UA: NEGATIVE
Glucose, UA: NEGATIVE
Leukocytes,UA: NEGATIVE
Nitrite, UA: NEGATIVE
Protein,UA: NEGATIVE
RBC, UA: NEGATIVE
Specific Gravity, UA: 1.02 (ref 1.005–1.030)
Urobilinogen, Ur: 0.2 mg/dL (ref 0.2–1.0)
pH, UA: 6 (ref 5.0–7.5)

## 2020-07-20 LAB — MICROALBUMIN, URINE WAIVED
Creatinine, Urine Waived: 300 mg/dL (ref 10–300)
Microalb, Ur Waived: 30 mg/L — ABNORMAL HIGH (ref 0–19)
Microalb/Creat Ratio: <30 mg/g (ref <30)

## 2020-07-20 MED ORDER — PANTOPRAZOLE SODIUM 40 MG PO TBEC: 40.0000 mg | DELAYED_RELEASE_TABLET | Freq: Every day | ORAL | 1 refills | Status: DC

## 2020-07-20 MED ORDER — AMLODIPINE BESYLATE 5 MG PO TABS: 5.0000 mg | ORAL_TABLET | Freq: Every day | ORAL | 1 refills | Status: DC

## 2020-07-20 MED ORDER — BENAZEPRIL HCL 40 MG PO TABS: 40.0000 mg | ORAL_TABLET | Freq: Every day | ORAL | 1 refills | Status: DC

## 2020-07-20 NOTE — Progress Notes (Signed)
BP 100/66   Pulse 74   Temp 97.7 F (36.5 C) (Oral)   Ht 5\' 11"  (1.803 m)   Wt 207 lb 6.4 oz (94.1 kg)   SpO2 98%   BMI 28.93 kg/m    Subjective:    Patient ID: Christopher Henderson, male    DOB: 09/08/1945, 75 y.o.   MRN: [id number]  HPI: MITHCELL Henderson is a 75 y.o. male presenting on 07/20/2020 for comprehensive medical examination. Current medical complaints include:  Was walking a few weeks ago and had some pain in the back of his L leg, but it's gone now.   HYPERTENSION Hypertension status: controlled  Satisfied with current treatment? no Duration of hypertension: chronic BP monitoring frequency:  not checking BP medication side effects:  no Medication compliance: excellent compliance Previous BP meds: amlodipine, benepril Aspirin: yes Recurrent headaches: no Visual changes: no Palpitations: no Dyspnea: no Chest pain: no Lower extremity edema: no Dizzy/lightheaded: no  He currently lives with: wife Interim Problems from his last visit: no  Depression Screen done today and results listed below:  Depression screen [facility] 2/9 07/20/2020 04/06/2020 04/01/2019  Decreased Interest 0 0 0  Down, Depressed, Hopeless 0 0 0  PHQ - 2 Score 0 0 0  Some encounter information is confidential and restricted. Go to Review Flowsheets activity to see all data.    Past Medical History:  Past Medical History:  Diagnosis Date  . Dental bridge present    also some implants  . Hypertension   . Senile purpura ([place]) 01/09/2018    Surgical History:  Past Surgical History:  Procedure Laterality Date  . COLONOSCOPY WITH PROPOFOL N/A 12/31/2018   Procedure: COLONOSCOPY WITH BIOPSY;  Surgeon: Lucilla Lame, MD;  Location: [place];  Service: Endoscopy;  Laterality: N/A;  . ESOPHAGEAL DILATION  06/23/2016   Procedure: ESOPHAGEAL DILATION;  Surgeon: Lucilla Lame, MD;  Location: [place];  Service: Endoscopy;;  . ESOPHAGEAL DILATION  08/12/2016   Procedure: ESOPHAGEAL  DILATION;  Surgeon: Lucilla Lame, MD;  Location: [place];  Service: Endoscopy;;  . ESOPHAGOGASTRODUODENOSCOPY (EGD) WITH PROPOFOL N/A 06/23/2016   Procedure: ESOPHAGOGASTRODUODENOSCOPY (EGD) WITH PROPOFOL;  Surgeon: Lucilla Lame, MD;  Location: [place];  Service: Endoscopy;  Laterality: N/A;  . ESOPHAGOGASTRODUODENOSCOPY (EGD) WITH PROPOFOL N/A 08/12/2016   Procedure: ESOPHAGOGASTRODUODENOSCOPY (EGD) WITH PROPOFOL;  Surgeon: Lucilla Lame, MD;  Location: [place];  Service: Endoscopy;  Laterality: N/A;  . EYE SURGERY  2014   cataract Jan and Feb with implants  . HAND / FINGER TENDON LESION EXCISION Left 2000  . I & D EXTREMITY Left 06/18/2012   Procedure: Irrigation and Debridement with wound exploration Left Hand and percutaneous pinning of left Fifth finger and third metacarpal;  Surgeon: Schuyler Amor, MD;  Location: [place];  Service: Orthopedics;  Laterality: Left;  . KNEE SURGERY Left 1972  . POLYPECTOMY N/A 12/31/2018   Procedure: POLYPECTOMY;  Surgeon: Lucilla Lame, MD;  Location: [place];  Service: Endoscopy;  Laterality: N/A;    Medications:  Current Outpatient Medications on File Prior to Visit  Medication Sig  . Apoaequorin (PREVAGEN) 10 MG CAPS Take 1 capsule by mouth daily.  Marland Kitchen aspirin 81 MG tablet Take 81 mg by mouth daily.  Marland Kitchen dutasteride (AVODART) 0.5 MG capsule Take 0.5 mg by mouth every other day.  . Omega-3 Fatty Acids (FISH OIL) 1000 MG CAPS Take 1,000 mg by mouth 2 (two) times daily.  . sildenafil (VIAGRA) 100  MG tablet Take 100 mg by mouth daily as needed for erectile dysfunction.  . TURMERIC CURCUMIN PO Take 500 mg by mouth daily.  Marland Kitchen UNABLE TO FIND Take 20 mg by mouth daily. Eye Shield Plus Lutein  . latanoprost (XALATAN) 0.005 % ophthalmic solution Place 1 drop into both eyes at bedtime.   No current facility-administered medications on file prior to visit.    Allergies:  Allergies  Allergen Reactions  . Penicillins  Hives  . Shrimp [Shellfish Allergy] Hives    Social History:  Social History   Socioeconomic History  . Marital status: Married    Spouse name: Not on file  . Number of children: Not on file  . Years of education: Not on file  . Highest education level: Not on file  Occupational History  . Occupation: retired  Tobacco Use  . Smoking status: Never Smoker  . Smokeless tobacco: Never Used  Vaping Use  . Vaping Use: Never used  Substance and Sexual Activity  . Alcohol use: No  . Drug use: No  . Sexual activity: Yes  Other Topics Concern  . Not on file  Social History Narrative  . Not on file   Social Determinants of Health   Financial Resource Strain: Low Risk   . Difficulty of Paying Living Expenses: Not hard at all  Food Insecurity: No Food Insecurity  . Worried About Charity fundraiser in the Last Year: Never true  . Ran Out of Food in the Last Year: Never true  Transportation Needs: No Transportation Needs  . Lack of Transportation (Medical): No  . Lack of Transportation (Non-Medical): No  Physical Activity: Sufficiently Active  . Days of Exercise per Week: 5 days  . Minutes of Exercise per Session: 60 min  Stress: No Stress Concern Present  . Feeling of Stress : Not at all  Social Connections: Not on file  Intimate Partner Violence: Not on file   Social History   Tobacco Use  Smoking Status Never Smoker  Smokeless Tobacco Never Used   Social History   Substance and Sexual Activity  Alcohol Use No    Family History:  Family History  Problem Relation Age of Onset  . Hypertension Mother   . Diabetes Mother   . Thyroid disease Mother   . Glaucoma Father   . Cancer Sister        colon and breast  . Alzheimer's disease Sister   . Diabetes Brother   . Heart disease Brother   . Seizures Sister   . Dementia Sister     Past medical history, surgical history, medications, allergies, family history and social history reviewed with patient today and  changes made to appropriate areas of the chart.   Review of Systems  Constitutional: Negative.   HENT: Negative.   Eyes: Negative.   Respiratory: Negative.   Cardiovascular: Negative.   Gastrointestinal: Negative.   Genitourinary: Negative.   Musculoskeletal: Negative.   Skin: Negative.   Neurological: Negative.   Endo/Heme/Allergies: Negative for environmental allergies and polydipsia. Bruises/bleeds easily.  Psychiatric/Behavioral: Negative.    All other ROS negative except what is listed above and in the HPI.      Objective:    BP 100/66   Pulse 74   Temp 97.7 F (36.5 C) (Oral)   Ht 5\' 11"  (1.803 m)   Wt 207 lb 6.4 oz (94.1 kg)   SpO2 98%   BMI 28.93 kg/m   Wt Readings from Last 3 Encounters:  07/20/20 207 lb 6.4 oz (94.1 kg)  04/06/20 208 lb (94.3 kg)  11/07/19 209 lb 6.4 oz (95 kg)    Physical Exam Vitals and nursing note reviewed.  Constitutional:      General: He is not in acute distress.    Appearance: Normal appearance. He is normal weight. He is not ill-appearing, toxic-appearing or diaphoretic.  HENT:     Head: Normocephalic and atraumatic.     Right Ear: Tympanic membrane, ear canal and external ear normal. There is no impacted cerumen.     Left Ear: Tympanic membrane, ear canal and external ear normal. There is no impacted cerumen.     Nose: Nose normal. No congestion or rhinorrhea.     Mouth/Throat:     Mouth: Mucous membranes are moist.     Pharynx: Oropharynx is clear. No oropharyngeal exudate or posterior oropharyngeal erythema.  Eyes:     General: No scleral icterus.       Right eye: No discharge.        Left eye: No discharge.     Extraocular Movements: Extraocular movements intact.     Conjunctiva/sclera: Conjunctivae normal.     Pupils: Pupils are equal, round, and reactive to light.  Neck:     Vascular: No carotid bruit.  Cardiovascular:     Rate and Rhythm: Normal rate and regular rhythm.     Pulses: Normal pulses.     Heart sounds:  No murmur heard. No friction rub. No gallop.   Pulmonary:     Effort: Pulmonary effort is normal. No respiratory distress.     Breath sounds: Normal breath sounds. No stridor. No wheezing, rhonchi or rales.  Chest:     Chest wall: No tenderness.  Abdominal:     General: Abdomen is flat. Bowel sounds are normal. There is no distension.     Palpations: Abdomen is soft. There is no mass.     Tenderness: There is no abdominal tenderness. There is no right CVA tenderness, left CVA tenderness, guarding or rebound.     Hernia: No hernia is present.  Genitourinary:    Comments: Genital exam deferred with shared decision making Musculoskeletal:        General: No swelling, tenderness, deformity or signs of injury.     Cervical back: Normal range of motion and neck supple. No rigidity. No muscular tenderness.     Right lower leg: No edema.     Left lower leg: No edema.  Lymphadenopathy:     Cervical: No cervical adenopathy.  Skin:    General: Skin is warm and dry.     Capillary Refill: Capillary refill takes less than 2 seconds.     Coloration: Skin is not jaundiced or pale.     Findings: No bruising, erythema, lesion or rash.     Comments: Well healing wound on R leg  Neurological:     General: No focal deficit present.     Mental Status: He is alert and oriented to person, place, and time.     Cranial Nerves: No cranial nerve deficit.     Sensory: No sensory deficit.     Motor: No weakness.     Coordination: Coordination normal.     Gait: Gait normal.     Deep Tendon Reflexes: Reflexes normal.  Psychiatric:        Mood and Affect: Mood normal.        Behavior: Behavior normal.        Thought Content: Thought  content normal.        Judgment: Judgment normal.     Results for orders placed or performed in visit on 11/07/19  Basic metabolic panel  Result Value Ref Range   Glucose 119 (H) 65 - 99 mg/dL   BUN 11 8 - 27 mg/dL   Creatinine, Ser 4.03 0.76 - 1.27 mg/dL   GFR calc non  Af Amer 88 >59 mL/min/1.73   GFR calc Af Amer 102 >59 mL/min/1.73   BUN/Creatinine Ratio 14 10 - 24   Sodium 141 134 - 144 mmol/L   Potassium 4.3 3.5 - 5.2 mmol/L   Chloride 104 96 - 106 mmol/L   CO2 24 20 - 29 mmol/L   Calcium 9.5 8.6 - 10.2 mg/dL      Assessment & Plan:   Problem List Items Addressed This Visit      Cardiovascular and Mediastinum   Essential hypertension    Under good control on current regimen. Continue current regimen. Continue to monitor. Call with any concerns. Refills given. Labs drawn today.       Relevant Medications   amLODipine (NORVASC) 5 MG tablet   benazepril (LOTENSIN) 40 MG tablet   Other Relevant Orders   Comprehensive metabolic panel   TSH   Urinalysis, Routine w reflex microscopic   Microalbumin, Urine Waived   Senile purpura ([facility])    Reassured patient. Continue to monitor.       Relevant Medications   amLODipine (NORVASC) 5 MG tablet   benazepril (LOTENSIN) 40 MG tablet   Other Relevant Orders   CBC with Differential/Platelet     Genitourinary   BPH with obstruction/lower urinary tract symptoms    Follows with urology. Continue to follow with him. Call with any concerns.       Relevant Orders   Comprehensive metabolic panel    Other Visit Diagnoses    Routine general medical examination at a health care facility    -  Primary   Vaccines updated. Screening labs checked today. Colonoscopy up to date. Continue diet and exercise. Call with any concerns.    Screening for cholesterol level       Labs drawn today. Await results.    Relevant Orders   Lipid Panel w/o Chol/HDL Ratio   Need for shingles vaccine       Shingrix given today.   Relevant Orders   Varicella-zoster vaccine IM (Shingrix)   Open wound of right lower leg, initial encounter       Healing well. due for Td- ordered today.    Relevant Orders   Td : Tetanus/diphtheria >7yo Preservative  free       Discussed aspirin prophylaxis for myocardial infarction  prevention and decision was made to continue ASA  LABORATORY TESTING:  Health maintenance labs ordered today as discussed above.   The natural history of prostate cancer and ongoing controversy regarding screening and potential treatment outcomes of prostate cancer has been discussed with the patient. The meaning of a false positive PSA and a false negative PSA has been discussed. He indicates understanding of the limitations of this screening test and wishes to proceed with screening PSA testing.   IMMUNIZATIONS:   - Tdap: Tetanus vaccination status reviewed: Td administered today - Influenza: Up to date - Pneumovax: Up to date - Prevnar: Up to date - COVID: Up to date - Shingrix vaccine: Administered today  SCREENING: - Colonoscopy: Up to date  Discussed with patient purpose of the colonoscopy is to detect colon  cancer at curable precancerous or early stages   PATIENT COUNSELING:    Sexuality: Discussed sexually transmitted diseases, partner selection, use of condoms, avoidance of unintended pregnancy  and contraceptive alternatives.   Advised to avoid cigarette smoking.  I discussed with the patient that most people either abstain from alcohol or drink within safe limits (<=14/week and <=4 drinks/occasion for males, <=7/weeks and <= 3 drinks/occasion for females) and that the risk for alcohol disorders and other health effects rises proportionally with the number of drinks per week and how often a drinker exceeds daily limits.  Discussed cessation/primary prevention of drug use and availability of treatment for abuse.   Diet: Encouraged to adjust caloric intake to maintain  or achieve ideal body weight, to reduce intake of dietary saturated fat and total fat, to limit sodium intake by avoiding high sodium foods and not adding table salt, and to maintain adequate dietary potassium and calcium preferably from fresh fruits, vegetables, and low-fat dairy products.    stressed the  importance of regular exercise  Injury prevention: Discussed safety belts, safety helmets, smoke detector, smoking near bedding or upholstery.   Dental health: Discussed importance of regular tooth brushing, flossing, and dental visits.   Follow up plan: NEXT PREVENTATIVE PHYSICAL DUE IN 1 YEAR. Return in about 6 months (around 01/19/2021).

## 2020-07-20 NOTE — Assessment & Plan Note (Signed)
Under good control on current regimen. Continue current regimen. Continue to monitor. Call with any concerns. Refills given. Labs drawn today.   

## 2020-07-20 NOTE — Assessment & Plan Note (Signed)
Reassured patient. Continue to monitor.  

## 2020-07-20 NOTE — Patient Instructions (Addendum)
Recombinant Zoster (Shingles) Vaccine: What You Need to Know 1. Why get vaccinated? Recombinant zoster (shingles) vaccine can prevent shingles. Shingles (also called herpes zoster, or just zoster) is a painful skin rash, usually with blisters. In addition to the rash, shingles can cause fever, headache, chills, or upset stomach. More rarely, shingles can lead to pneumonia, hearing problems, blindness, brain inflammation (encephalitis), or death. The most common complication of shingles is long-term nerve pain called postherpetic neuralgia (PHN). PHN occurs in the areas where the shingles rash was, even after the rash clears up. It can last for months or years after the rash goes away. The pain from PHN can be severe and debilitating. About 10 to 18% of people who get shingles will experience PHN. The risk of PHN increases with age. An older adult with shingles is more likely to develop PHN and have longer lasting and more severe pain than a younger person with shingles. Shingles is caused by the varicella zoster virus, the same virus that causes chickenpox. After you have chickenpox, the virus stays in your body and can cause shingles later in life. Shingles cannot be passed from one person to another, but the virus that causes shingles can spread and cause chickenpox in someone who had never had chickenpox or received chickenpox vaccine. 2. Recombinant shingles vaccine Recombinant shingles vaccine provides strong protection against shingles. By preventing shingles, recombinant shingles vaccine also protects against PHN. Recombinant shingles vaccine is the preferred vaccine for the prevention of shingles. However, a different vaccine, live shingles vaccine, may be used in some circumstances. The recombinant shingles vaccine is recommended for adults 50 years and older without serious immune problems. It is given as a two-dose series. This vaccine is also recommended for people who have already gotten  another type of shingles vaccine, the live shingles vaccine. There is no live virus in this vaccine. Shingles vaccine may be given at the same time as other vaccines. 3. Talk with your health care provider Tell your vaccine provider if the person getting the vaccine:  Has had an allergic reaction after a previous dose of recombinant shingles vaccine, or has any severe, life-threatening allergies.  Is pregnant or breastfeeding.  Is currently experiencing an episode of shingles. In some cases, your health care provider may decide to postpone shingles vaccination to a future visit. People with minor illnesses, such as a cold, may be vaccinated. People who are moderately or severely ill should usually wait until they recover before getting recombinant shingles vaccine. Your health care provider can give you more information. 4. Risks of a vaccine reaction  A sore arm with mild or moderate pain is very common after recombinant shingles vaccine, affecting about 80% of vaccinated people. Redness and swelling can also happen at the site of the injection.  Tiredness, muscle pain, headache, shivering, fever, stomach pain, and nausea happen after vaccination in more than half of people who receive recombinant shingles vaccine. In clinical trials, about 1 out of 6 people who got recombinant zoster vaccine experienced side effects that prevented them from doing regular activities. Symptoms usually went away on their own in 2 to 3 days. You should still get the second dose of recombinant zoster vaccine even if you had one of these reactions after the first dose. People sometimes faint after medical procedures, including vaccination. Tell your provider if you feel dizzy or have vision changes or ringing in the ears. As with any medicine, there is a very remote chance of a vaccine causing  a severe allergic reaction, other serious injury, or death. 5. What if there is a serious problem? An allergic reaction  could occur after the vaccinated person leaves the clinic. If you see signs of a severe allergic reaction (hives, swelling of the face and throat, difficulty breathing, a fast heartbeat, dizziness, or weakness), call 9-1-1 and get the person to the nearest hospital. For other signs that concern you, call your health care provider. Adverse reactions should be reported to the Vaccine Adverse Event Reporting System (VAERS). Your health care provider will usually file this report, or you can do it yourself. Visit the VAERS website at www.vaers.SamedayNews.es or call (351) 461-7351. VAERS is only for reporting reactions, and VAERS staff do not give medical advice. 6. How can I learn more?  Ask your health care provider.  Call your local or state health department.  Contact the Centers for Disease Control and Prevention (CDC): ? Call 4304985969 (1-800-CDC-INFO) or ? Visit CDC's website at http://hunter.com/ Vaccine Information Statement Recombinant Zoster Vaccine (01/24/2018) This information is not intended to replace advice given to you by your health care provider. Make sure you discuss any questions you have with your health care provider. Document Revised: 11/15/2019 Document Reviewed: 11/15/2019 Elsevier Patient Education  2021 [employer]. Td (Tetanus, Diphtheria) Vaccine: What You Need to Know 1. Why get vaccinated? Td vaccine can prevent tetanus and diphtheria. Tetanus enters the body through cuts or wounds. Diphtheria spreads from person to person.  TETANUS (T) causes painful stiffening of the muscles. Tetanus can lead to serious health problems, including being unable to open the mouth, having trouble swallowing and breathing, or death.  DIPHTHERIA (D) can lead to difficulty breathing, heart failure, paralysis, or death. 2. Td vaccine Td is only for children 7 years and older, adolescents, and adults.  Td is usually given as a booster dose every 10 years, or after 5 years in the case  of a severe or dirty wound or burn. Another vaccine, called "Tdap," may be used instead of Td. Tdap protects against pertussis, also known as "whooping cough," in addition to tetanus and diphtheria. Td may be given at the same time as other vaccines. 3. Talk with your health care provider Tell your vaccination provider if the person getting the vaccine:  Has had an allergic reaction after a previous dose of any vaccine that protects against tetanus or diphtheria, or has any severe, life-threatening allergies  Has ever had Guillain-Barr Syndrome (also called "GBS")  Has had severe pain or swelling after a previous dose of any vaccine that protects against tetanus or diphtheria In some cases, your health care provider may decide to postpone Td vaccination until a future visit. People with minor illnesses, such as a cold, may be vaccinated. People who are moderately or severely ill should usually wait until they recover before getting Td vaccine.  Your health care provider can give you more information. 4. Risks of a vaccine reaction  Pain, redness, or swelling where the shot was given, mild fever, headache, feeling tired, and nausea, vomiting, diarrhea, or stomachache sometimes happen after Td vaccination. People sometimes faint after medical procedures, including vaccination. Tell your provider if you feel dizzy or have vision changes or ringing in the ears.  As with any medicine, there is a very remote chance of a vaccine causing a severe allergic reaction, other serious injury, or death. 5. What if there is a serious problem? An allergic reaction could occur after the vaccinated person leaves the clinic. If you  see signs of a severe allergic reaction (hives, swelling of the face and throat, difficulty breathing, a fast heartbeat, dizziness, or weakness), call 9-1-1 and get the person to the nearest hospital.  For other signs that concern you, call your health care provider.  Adverse reactions  should be reported to the Vaccine Adverse Event Reporting System (VAERS). Your health care provider will usually file this report, or you can do it yourself. Visit the VAERS website at www.vaers.SamedayNews.es or call 479-008-9424. VAERS is only for reporting reactions, and VAERS staff members do not give medical advice. 6. The National Vaccine Injury Compensation Program The [employer] Vaccine Injury Compensation Program (VICP) is a federal program that was created to compensate people who may have been injured by certain vaccines. Claims regarding alleged injury or death due to vaccination have a time limit for filing, which may be as short as two years. Visit the VICP website at GoldCloset.com.ee or call 720-689-5171 to learn about the program and about filing a claim. 7. How can I learn more?  Ask your health care provider.  Call your local or state health department.  Visit the website of the Food and Drug Administration (FDA) for vaccine package inserts and additional information at TraderRating.uy.  Contact the Centers for Disease Control and Prevention (CDC): ? Call (661)717-7315 (1-800-CDC-INFO) or ? Visit CDC's website at http://hunter.com/. Vaccine Information Statement Td (Tetanus, Diphtheria) Vaccine (11/01/2019) This information is not intended to replace advice given to you by your health care provider. Make sure you discuss any questions you have with your health care provider. Document Revised: 12/19/2019 Document Reviewed: 12/19/2019 Elsevier Patient Education  [place] Maintenance After Age 44 After age 88, you are at a higher risk for certain long-term diseases and infections as well as injuries from falls. Falls are a major cause of broken bones and head injuries in people who are older than age 17. Getting regular preventive care can help to keep you healthy and well. Preventive care includes getting regular testing  and making lifestyle changes as recommended by your health care provider. Talk with your health care provider about:  Which screenings and tests you should have. A screening is a test that checks for a disease when you have no symptoms.  A diet and exercise plan that is right for you. What should I know about screenings and tests to prevent falls? Screening and testing are the best ways to find a health problem early. Early diagnosis and treatment give you the best chance of managing medical conditions that are common after age 49. Certain conditions and lifestyle choices may make you more likely to have a fall. Your health care provider may recommend:  Regular vision checks. Poor vision and conditions such as cataracts can make you more likely to have a fall. If you wear glasses, make sure to get your prescription updated if your vision changes.  Medicine review. Work with your health care provider to regularly review all of the medicines you are taking, including over-the-counter medicines. Ask your health care provider about any side effects that may make you more likely to have a fall. Tell your health care provider if any medicines that you take make you feel dizzy or sleepy.  Osteoporosis screening. Osteoporosis is a condition that causes the bones to get weaker. This can make the bones weak and cause them to break more easily.  Blood pressure screening. Blood pressure changes and medicines to control blood pressure can make you feel  dizzy.  Strength and balance checks. Your health care provider may recommend certain tests to check your strength and balance while standing, walking, or changing positions.  Foot health exam. Foot pain and numbness, as well as not wearing proper footwear, can make you more likely to have a fall.  Depression screening. You may be more likely to have a fall if you have a fear of falling, feel emotionally low, or feel unable to do activities that you used to  do.  Alcohol use screening. Using too much alcohol can affect your balance and may make you more likely to have a fall. What actions can I take to lower my risk of falls? General instructions  Talk with your health care provider about your risks for falling. Tell your health care provider if: ? You fall. Be sure to tell your health care provider about all falls, even ones that seem minor. ? You feel dizzy, sleepy, or off-balance.  Take over-the-counter and prescription medicines only as told by your health care provider. These include any supplements.  Eat a healthy diet and maintain a healthy weight. A healthy diet includes low-fat dairy products, low-fat (lean) meats, and fiber from whole grains, beans, and lots of fruits and vegetables. Home safety  Remove any tripping hazards, such as rugs, cords, and clutter.  Install safety equipment such as grab bars in bathrooms and safety rails on stairs.  Keep rooms and walkways well-lit. Activity  Follow a regular exercise program to stay fit. This will help you maintain your balance. Ask your health care provider what types of exercise are appropriate for you.  If you need a cane or walker, use it as recommended by your health care provider.  Wear supportive shoes that have nonskid soles.   Lifestyle  Do not drink alcohol if your health care provider tells you not to drink.  If you drink alcohol, limit how much you have: ? 0-1 drink a day for women. ? 0-2 drinks a day for men.  Be aware of how much alcohol is in your drink. In the [place] one drink equals one typical bottle of beer (12 oz), one-half glass of wine (5 oz), or one shot of hard liquor (1 oz).  Do not use any products that contain nicotine or tobacco, such as cigarettes and e-cigarettes. If you need help quitting, ask your health care provider. Summary  Having a healthy lifestyle and getting preventive care can help to protect your health and wellness after age  70.  Screening and testing are the best way to find a health problem early and help you avoid having a fall. Early diagnosis and treatment give you the best chance for managing medical conditions that are more common for people who are older than age 38.  Falls are a major cause of broken bones and head injuries in people who are older than age 50. Take precautions to prevent a fall at home.  Work with your health care provider to learn what changes you can make to improve your health and wellness and to prevent falls. This information is not intended to replace advice given to you by your health care provider. Make sure you discuss any questions you have with your health care provider. Document Revised: 07/05/2018 Document Reviewed: 01/25/2017 Elsevier Patient Education  2021 [employer].

## 2020-07-20 NOTE — Assessment & Plan Note (Signed)
Follows with urology. Continue to follow with him. Call with any concerns.

## 2020-07-21 LAB — CBC WITH DIFFERENTIAL/PLATELET
Basophils Absolute: 0 10*3/uL (ref 0.0–0.2)
Basos: 0 %
EOS (ABSOLUTE): 0.5 10*3/uL — ABNORMAL HIGH (ref 0.0–0.4)
Eos: 8 %
Hematocrit: 47.8 % (ref 37.5–51.0)
Hemoglobin: 16.1 g/dL (ref 13.0–17.7)
Immature Grans (Abs): 0 10*3/uL (ref 0.0–0.1)
Immature Granulocytes: 0 %
Lymphocytes Absolute: 1.7 10*3/uL (ref 0.7–3.1)
Lymphs: 25 %
MCH: 30.7 pg (ref 26.6–33.0)
MCHC: 33.7 g/dL (ref 31.5–35.7)
MCV: 91 fL (ref 79–97)
Monocytes Absolute: 0.4 10*3/uL (ref 0.1–0.9)
Monocytes: 6 %
Neutrophils Absolute: 4.2 10*3/uL (ref 1.4–7.0)
Neutrophils: 61 %
Platelets: 219 10*3/uL (ref 150–450)
RBC: 5.24 x10E6/uL (ref 4.14–5.80)
RDW: 12.2 % (ref 11.6–15.4)
WBC: 6.9 10*3/uL (ref 3.4–10.8)

## 2020-07-21 LAB — LIPID PANEL W/O CHOL/HDL RATIO
Cholesterol, Total: 226 mg/dL — ABNORMAL HIGH (ref 100–199)
HDL: 44 mg/dL (ref >39)
LDL Chol Calc (NIH): 152 mg/dL — ABNORMAL HIGH (ref 0–99)
Triglycerides: 164 mg/dL — ABNORMAL HIGH (ref 0–149)
VLDL Cholesterol Cal: 30 mg/dL (ref 5–40)

## 2020-07-21 LAB — COMPREHENSIVE METABOLIC PANEL
ALT: 19 IU/L (ref 0–44)
AST: 27 IU/L (ref 0–40)
Albumin/Globulin Ratio: 2 (ref 1.2–2.2)
Albumin: 5.1 g/dL — ABNORMAL HIGH (ref 3.7–4.7)
Alkaline Phosphatase: 67 IU/L (ref 44–121)
BUN/Creatinine Ratio: 10 (ref 10–24)
BUN: 11 mg/dL (ref 8–27)
Bilirubin Total: 0.8 mg/dL (ref 0.0–1.2)
CO2: 23 mmol/L (ref 20–29)
Calcium: 9.9 mg/dL (ref 8.6–10.2)
Chloride: 99 mmol/L (ref 96–106)
Creatinine, Ser: 1.05 mg/dL (ref 0.76–1.27)
Globulin, Total: 2.6 g/dL (ref 1.5–4.5)
Glucose: 102 mg/dL — ABNORMAL HIGH (ref 65–99)
Potassium: 4 mmol/L (ref 3.5–5.2)
Sodium: 140 mmol/L (ref 134–144)
Total Protein: 7.7 g/dL (ref 6.0–8.5)
eGFR: 75 mL/min/{1.73_m2} (ref >59)

## 2020-07-21 LAB — TSH: TSH: 2.1 u[IU]/mL (ref 0.450–4.500)

## 2020-07-26 DIAGNOSIS — Z20822 Contact with and (suspected) exposure to covid-19: Secondary | ICD-10-CM | POA: Diagnosis not present

## 2020-08-18 DIAGNOSIS — H26493 Other secondary cataract, bilateral: Secondary | ICD-10-CM | POA: Diagnosis not present

## 2020-08-18 DIAGNOSIS — H26492 Other secondary cataract, left eye: Secondary | ICD-10-CM | POA: Diagnosis not present

## 2020-08-18 DIAGNOSIS — H401131 Primary open-angle glaucoma, bilateral, mild stage: Secondary | ICD-10-CM | POA: Diagnosis not present

## 2020-08-18 DIAGNOSIS — Z961 Presence of intraocular lens: Secondary | ICD-10-CM | POA: Diagnosis not present

## 2020-08-18 DIAGNOSIS — H18413 Arcus senilis, bilateral: Secondary | ICD-10-CM | POA: Diagnosis not present

## 2020-09-15 DIAGNOSIS — N5201 Erectile dysfunction due to arterial insufficiency: Secondary | ICD-10-CM | POA: Diagnosis not present

## 2020-09-15 DIAGNOSIS — R972 Elevated prostate specific antigen [PSA]: Secondary | ICD-10-CM | POA: Diagnosis not present

## 2020-09-15 DIAGNOSIS — N401 Enlarged prostate with lower urinary tract symptoms: Secondary | ICD-10-CM | POA: Diagnosis not present

## 2020-09-15 DIAGNOSIS — Z79899 Other long term (current) drug therapy: Secondary | ICD-10-CM | POA: Diagnosis not present

## 2020-11-26 ENCOUNTER — Other Ambulatory Visit: Payer: Self-pay

## 2020-11-26 ENCOUNTER — Ambulatory Visit: Payer: Medicare HMO | Admitting: Dermatology

## 2020-11-26 DIAGNOSIS — L570 Actinic keratosis: Secondary | ICD-10-CM

## 2020-11-26 DIAGNOSIS — L820 Inflamed seborrheic keratosis: Secondary | ICD-10-CM | POA: Diagnosis not present

## 2020-11-26 DIAGNOSIS — L578 Other skin changes due to chronic exposure to nonionizing radiation: Secondary | ICD-10-CM

## 2020-11-26 DIAGNOSIS — D692 Other nonthrombocytopenic purpura: Secondary | ICD-10-CM

## 2020-11-26 NOTE — Patient Instructions (Addendum)

## 2020-11-26 NOTE — Progress Notes (Signed)
Follow-Up Visit   Subjective  Christopher Henderson is a 75 y.o. male who presents for the following: Skin Problem (Spot check on the face, irritated itchy spots pt would like to have removed today ).  The following portions of the chart were reviewed this encounter and updated as appropriate:   Tobacco  Allergies  Meds  Problems  Med Hx  Surg Hx  Fam Hx     Review of Systems:  No other skin or systemic complaints except as noted in HPI or Assessment and Plan.  Objective  Well appearing patient in no apparent distress; mood and affect are within normal limits.  A focused examination was performed including face. Relevant physical exam findings are noted in the Assessment and Plan.  left preauricular x 1, forehead x 2, right zygoma x 1 (4) Erythematous thin papules/macules with gritty scale.   forehead x 2, right cheek x 1  (3) Erythematous keratotic or waxy stuck-on papule or plaque.    Assessment & Plan  AK (actinic keratosis) (4) left preauricular x 1, forehead x 2, right zygoma x 1  Actinic keratoses are precancerous spots that appear secondary to cumulative UV radiation exposure/sun exposure over time. They are chronic with expected duration over 1 year. A portion of actinic keratoses will progress to squamous cell carcinoma of the skin. It is not possible to reliably predict which spots will progress to skin cancer and so treatment is recommended to prevent development of skin cancer.  Recommend daily broad spectrum sunscreen SPF 30+ to sun-exposed areas, reapply every 2 hours as needed.  Recommend staying in the shade or wearing long sleeves, sun glasses (UVA+UVB protection) and wide brim hats (4-inch brim around the entire circumference of the hat). Call for new or changing lesions.   Destruction of lesion - left preauricular x 1, forehead x 2, right zygoma x 1 Complexity: simple   Destruction method: cryotherapy   Informed consent: discussed and consent obtained    Timeout:  patient name, date of birth, surgical site, and procedure verified Lesion destroyed using liquid nitrogen: Yes   Region frozen until ice ball extended beyond lesion: Yes   Outcome: patient tolerated procedure well with no complications   Post-procedure details: wound care instructions given    Inflamed seborrheic keratosis forehead x 2, right cheek x 1  (3)  Destruction of lesion - forehead x 2, right cheek x 1  (3) Complexity: simple   Destruction method: cryotherapy   Informed consent: discussed and consent obtained   Timeout:  patient name, date of birth, surgical site, and procedure verified Lesion destroyed using liquid nitrogen: Yes   Region frozen until ice ball extended beyond lesion: Yes   Outcome: patient tolerated procedure well with no complications   Post-procedure details: wound care instructions given    Actinic Damage - chronic, secondary to cumulative UV radiation exposure/sun exposure over time - diffuse scaly erythematous macules with underlying dyspigmentation - Recommend daily broad spectrum sunscreen SPF 30+ to sun-exposed areas, reapply every 2 hours as needed.  - Recommend staying in the shade or wearing long sleeves, sun glasses (UVA+UVB protection) and wide brim hats (4-inch brim around the entire circumference of the hat). - Call for new or changing lesions.   Purpura - Chronic; persistent and recurrent.  Treatable, but not curable. - Violaceous macules and patches - Benign - Related to trauma, age, sun damage and/or use of blood thinners, chronic use of topical and/or oral steroids - Observe - Can use  OTC arnica containing moisturizer such as Dermend Bruise Formula if desired - Call for worsening or other concerns   Return in about 6 months (around 05/26/2021) for TBSE, .  I, Marye Round, CMA, am acting as scribe for Sarina Ser, MD .  Documentation: I have reviewed the above documentation for accuracy and completeness, and I agree with  the above.  Sarina Ser, MD

## 2020-12-02 ENCOUNTER — Encounter: Payer: Medicare HMO | Admitting: Dermatology

## 2020-12-03 ENCOUNTER — Encounter: Payer: Self-pay | Admitting: Dermatology

## 2021-01-18 DIAGNOSIS — H40023 Open angle with borderline findings, high risk, bilateral: Secondary | ICD-10-CM | POA: Diagnosis not present

## 2021-01-19 ENCOUNTER — Other Ambulatory Visit: Payer: Self-pay

## 2021-01-19 ENCOUNTER — Ambulatory Visit (INDEPENDENT_AMBULATORY_CARE_PROVIDER_SITE_OTHER): Payer: Medicare HMO | Admitting: Family Medicine

## 2021-01-19 VITALS — BP 118/77 | HR 68 | Wt 202.0 lb

## 2021-01-19 DIAGNOSIS — Z23 Encounter for immunization: Secondary | ICD-10-CM | POA: Diagnosis not present

## 2021-01-19 DIAGNOSIS — I10 Essential (primary) hypertension: Secondary | ICD-10-CM

## 2021-01-19 MED ORDER — AMLODIPINE BESYLATE 5 MG PO TABS: 5.0000 mg | ORAL_TABLET | Freq: Every day | ORAL | 1 refills | Status: DC

## 2021-01-19 MED ORDER — PANTOPRAZOLE SODIUM 40 MG PO TBEC
40.0000 mg | DELAYED_RELEASE_TABLET | Freq: Every day | ORAL | 1 refills | Status: DC
Start: 2021-01-19 — End: 2021-08-11

## 2021-01-19 MED ORDER — BENAZEPRIL HCL 40 MG PO TABS: 40.0000 mg | ORAL_TABLET | Freq: Every day | ORAL | 1 refills | Status: DC

## 2021-01-19 NOTE — Assessment & Plan Note (Signed)
Under good control on current regimen. Continue current regimen. Continue to monitor. Call with any concerns. Refills given. Labs drawn today.   

## 2021-01-19 NOTE — Progress Notes (Signed)
BP 118/77   Pulse 68   Wt 202 lb (91.6 kg)   BMI 28.17 kg/m    Subjective:    Patient ID: Christopher Henderson, male    DOB: 03-May-1945, 75 y.o.   MRN: [id number]  HPI: Christopher Henderson is a 75 y.o. male  Chief Complaint  Patient presents with   Hypertension   HYPERTENSION Hypertension status: controlled  Satisfied with current treatment? yes Duration of hypertension: chronic BP monitoring frequency:  not checking BP range:  BP medication side effects:  no Medication compliance: excellent compliance Previous BP meds:amlodipine, benazepril Aspirin: yes Recurrent headaches: no Visual changes: no Palpitations: no Dyspnea: no Chest pain: no Lower extremity edema: no Dizzy/lightheaded: no  Relevant past medical, surgical, family and social history reviewed and updated as indicated. Interim medical history since our last visit reviewed. Allergies and medications reviewed and updated.  Review of Systems  Constitutional: Negative.   Respiratory: Negative.    Cardiovascular: Negative.   Gastrointestinal: Negative.   Musculoskeletal: Negative.   Neurological: Negative.   Psychiatric/Behavioral: Negative.     Per HPI unless specifically indicated above     Objective:    BP 118/77   Pulse 68   Wt 202 lb (91.6 kg)   BMI 28.17 kg/m   Wt Readings from Last 3 Encounters:  01/19/21 202 lb (91.6 kg)  07/20/20 207 lb 6.4 oz (94.1 kg)  04/06/20 208 lb (94.3 kg)    Physical Exam Vitals and nursing note reviewed.  Constitutional:      General: He is not in acute distress.    Appearance: Normal appearance. He is not ill-appearing, toxic-appearing or diaphoretic.  HENT:     Head: Normocephalic and atraumatic.     Right Ear: External ear normal.     Left Ear: External ear normal.     Nose: Nose normal.     Mouth/Throat:     Mouth: Mucous membranes are moist.     Pharynx: Oropharynx is clear.  Eyes:     General: No scleral icterus.       Right eye: No discharge.         Left eye: No discharge.     Extraocular Movements: Extraocular movements intact.     Conjunctiva/sclera: Conjunctivae normal.     Pupils: Pupils are equal, round, and reactive to light.  Cardiovascular:     Rate and Rhythm: Normal rate and regular rhythm.     Pulses: Normal pulses.     Heart sounds: Normal heart sounds. No murmur heard.   No friction rub. No gallop.  Pulmonary:     Effort: Pulmonary effort is normal. No respiratory distress.     Breath sounds: Normal breath sounds. No stridor. No wheezing, rhonchi or rales.  Chest:     Chest wall: No tenderness.  Musculoskeletal:        General: Normal range of motion.     Cervical back: Normal range of motion and neck supple.  Skin:    General: Skin is warm and dry.     Capillary Refill: Capillary refill takes less than 2 seconds.     Coloration: Skin is not jaundiced or pale.     Findings: No bruising, erythema, lesion or rash.  Neurological:     General: No focal deficit present.     Mental Status: He is alert and oriented to person, place, and time. Mental status is at baseline.  Psychiatric:        Mood and Affect: Mood  normal.        Behavior: Behavior normal.        Thought Content: Thought content normal.        Judgment: Judgment normal.    Results for orders placed or performed in visit on 07/20/20  Comprehensive metabolic panel  Result Value Ref Range   Glucose 102 (H) 65 - 99 mg/dL   BUN 11 8 - 27 mg/dL   Creatinine, Ser 1.05 0.76 - 1.27 mg/dL   eGFR 75 >59 mL/min/1.73   BUN/Creatinine Ratio 10 10 - 24   Sodium 140 134 - 144 mmol/L   Potassium 4.0 3.5 - 5.2 mmol/L   Chloride 99 96 - 106 mmol/L   CO2 23 20 - 29 mmol/L   Calcium 9.9 8.6 - 10.2 mg/dL   Total Protein 7.7 6.0 - 8.5 g/dL   Albumin 5.1 (H) 3.7 - 4.7 g/dL   Globulin, Total 2.6 1.5 - 4.5 g/dL   Albumin/Globulin Ratio 2.0 1.2 - 2.2   Bilirubin Total 0.8 0.0 - 1.2 mg/dL   Alkaline Phosphatase 67 44 - 121 IU/L   AST 27 0 - 40 IU/L   ALT 19 0 - 44  IU/L  CBC with Differential/Platelet  Result Value Ref Range   WBC 6.9 3.4 - 10.8 x10E3/uL   RBC 5.24 4.14 - 5.80 x10E6/uL   Hemoglobin 16.1 13.0 - 17.7 g/dL   Hematocrit 47.8 37.5 - 51.0 %   MCV 91 79 - 97 fL   MCH 30.7 26.6 - 33.0 pg   MCHC 33.7 31.5 - 35.7 g/dL   RDW 12.2 11.6 - 15.4 %   Platelets 219 150 - 450 x10E3/uL   Neutrophils 61 Not Estab. %   Lymphs 25 Not Estab. %   Monocytes 6 Not Estab. %   Eos 8 Not Estab. %   Basos 0 Not Estab. %   Neutrophils Absolute 4.2 1.4 - 7.0 x10E3/uL   Lymphocytes Absolute 1.7 0.7 - 3.1 x10E3/uL   Monocytes Absolute 0.4 0.1 - 0.9 x10E3/uL   EOS (ABSOLUTE) 0.5 (H) 0.0 - 0.4 x10E3/uL   Basophils Absolute 0.0 0.0 - 0.2 x10E3/uL   Immature Granulocytes 0 Not Estab. %   Immature Grans (Abs) 0.0 0.0 - 0.1 x10E3/uL  Lipid Panel w/o Chol/HDL Ratio  Result Value Ref Range   Cholesterol, Total 226 (H) 100 - 199 mg/dL   Triglycerides 164 (H) 0 - 149 mg/dL   HDL 44 >39 mg/dL   VLDL Cholesterol Cal 30 5 - 40 mg/dL   LDL Chol Calc (NIH) 152 (H) 0 - 99 mg/dL  TSH  Result Value Ref Range   TSH 2.100 0.450 - 4.500 uIU/mL  Urinalysis, Routine w reflex microscopic  Result Value Ref Range   Specific Gravity, UA 1.020 1.005 - 1.030   pH, UA 6.0 5.0 - 7.5   Color, UA Yellow Yellow   Appearance Ur Clear Clear   Leukocytes,UA Negative Negative   Protein,UA Negative Negative/Trace   Glucose, UA Negative Negative   Ketones, UA Trace (A) Negative   RBC, UA Negative Negative   Bilirubin, UA Negative Negative   Urobilinogen, Ur 0.2 0.2 - 1.0 mg/dL   Nitrite, UA Negative Negative  Microalbumin, Urine Waived  Result Value Ref Range   Microalb, Ur Waived 30 (H) 0 - 19 mg/L   Creatinine, Urine Waived 300 10 - 300 mg/dL   Microalb/Creat Ratio <30 <30 mg/g      Assessment & Plan:   Problem List Items Addressed This  Visit       Cardiovascular and Mediastinum   Essential hypertension - Primary    Under good control on current regimen. Continue  current regimen. Continue to monitor. Call with any concerns. Refills given. Labs drawn today.       Relevant Medications   amLODipine (NORVASC) 5 MG tablet   benazepril (LOTENSIN) 40 MG tablet   Other Relevant Orders   Basic metabolic panel   Other Visit Diagnoses     Needs flu shot       Flu shot given today.   Relevant Orders   Flu Vaccine QUAD High Dose(Fluad) (Completed)        Follow up plan: Return in about 6 months (around 07/20/2021), or physical.

## 2021-01-20 LAB — BASIC METABOLIC PANEL
BUN/Creatinine Ratio: 11 (ref 10–24)
BUN: 10 mg/dL (ref 8–27)
CO2: 24 mmol/L (ref 20–29)
Calcium: 9.6 mg/dL (ref 8.6–10.2)
Chloride: 103 mmol/L (ref 96–106)
Creatinine, Ser: 0.94 mg/dL (ref 0.76–1.27)
Glucose: 103 mg/dL — ABNORMAL HIGH (ref 70–99)
Potassium: 4.5 mmol/L (ref 3.5–5.2)
Sodium: 140 mmol/L (ref 134–144)
eGFR: 85 mL/min/{1.73_m2} (ref >59)

## 2021-04-09 ENCOUNTER — Ambulatory Visit: Payer: Medicare HMO

## 2021-04-12 ENCOUNTER — Ambulatory Visit (INDEPENDENT_AMBULATORY_CARE_PROVIDER_SITE_OTHER): Payer: Medicare HMO | Admitting: *Deleted

## 2021-04-12 DIAGNOSIS — Z0000 Encounter for general adult medical examination without abnormal findings: Secondary | ICD-10-CM

## 2021-04-12 NOTE — Patient Instructions (Signed)
Christopher Henderson , Thank you for taking time to come for your Medicare Wellness Visit. I appreciate your ongoing commitment to your health goals. Please review the following plan we discussed and let me know if I can assist you in the future.   Screening recommendations/referrals: Colonoscopy: up to date Recommended yearly ophthalmology/optometry visit for glaucoma screening and checkup Recommended yearly dental visit for hygiene and checkup  Vaccinations: Influenza vaccine: up to date Pneumococcal vaccine: up to date Tdap vaccine: up to date Shingles vaccine: 1 of 2    Advanced directives: yes not on file  Conditions/risks identified:   Next appointment: 07-20-2021 8:40  Preventive Care 16 Years and Older, Male Preventive care refers to lifestyle choices and visits with your health care provider that can promote health and wellness. What does preventive care include? A yearly physical exam. This is also called an annual well check. Dental exams once or twice a year. Routine eye exams. Ask your health care provider how often you should have your eyes checked. Personal lifestyle choices, including: Daily care of your teeth and gums. Regular physical activity. Eating a healthy diet. Avoiding tobacco and drug use. Limiting alcohol use. Practicing safe sex. Taking low doses of aspirin every day. Taking vitamin and mineral supplements as recommended by your health care provider. What happens during an annual well check? The services and screenings done by your health care provider during your annual well check will depend on your age, overall health, lifestyle risk factors, and family history of disease. Counseling  Your health care provider may ask you questions about your: Alcohol use. Tobacco use. Drug use. Emotional well-being. Home and relationship well-being. Sexual activity. Eating habits. History of falls. Memory and ability to understand (cognition). Work and work  Statistician. Screening  You may have the following tests or measurements: Height, weight, and BMI. Blood pressure. Lipid and cholesterol levels. These may be checked every 5 years, or more frequently if you are over 79 years old. Skin check. Lung cancer screening. You may have this screening every year starting at age 22 if you have a 30-pack-year history of smoking and currently smoke or have quit within the past 15 years. Fecal occult blood test (FOBT) of the stool. You may have this test every year starting at age 78. Flexible sigmoidoscopy or colonoscopy. You may have a sigmoidoscopy every 5 years or a colonoscopy every 10 years starting at age 99. Prostate cancer screening. Recommendations will vary depending on your family history and other risks. Hepatitis C blood test. Hepatitis B blood test. Sexually transmitted disease (STD) testing. Diabetes screening. This is done by checking your blood sugar (glucose) after you have not eaten for a while (fasting). You may have this done every 1-3 years. Abdominal aortic aneurysm (AAA) screening. You may need this if you are a current or former smoker. Osteoporosis. You may be screened starting at age 37 if you are at high risk. Talk with your health care provider about your test results, treatment options, and if necessary, the need for more tests. Vaccines  Your health care provider may recommend certain vaccines, such as: Influenza vaccine. This is recommended every year. Tetanus, diphtheria, and acellular pertussis (Tdap, Td) vaccine. You may need a Td booster every 10 years. Zoster vaccine. You may need this after age 54. Pneumococcal 13-valent conjugate (PCV13) vaccine. One dose is recommended after age 23. Pneumococcal polysaccharide (PPSV23) vaccine. One dose is recommended after age 6. Talk to your health care provider about which screenings and  vaccines you need and how often you need them. This information is not intended to replace  advice given to you by your health care provider. Make sure you discuss any questions you have with your health care provider. Document Released: 04/10/2015 Document Revised: 12/02/2015 Document Reviewed: 01/13/2015 Elsevier Interactive Patient Education  2017 [employer] Prevention in the Home Falls can cause injuries. They can happen to people of all ages. There are many things you can do to make your home safe and to help prevent falls. What can I do on the outside of my home? Regularly fix the edges of walkways and driveways and fix any cracks. Remove anything that might make you trip as you walk through a door, such as a raised step or threshold. Trim any bushes or trees on the path to your home. Use bright outdoor lighting. Clear any walking paths of anything that might make someone trip, such as rocks or tools. Regularly check to see if handrails are loose or broken. Make sure that both sides of any steps have handrails. Any raised decks and porches should have guardrails on the edges. Have any leaves, snow, or ice cleared regularly. Use sand or salt on walking paths during winter. Clean up any spills in your garage right away. This includes oil or grease spills. What can I do in the bathroom? Use night lights. Install grab bars by the toilet and in the tub and shower. Do not use towel bars as grab bars. Use non-skid mats or decals in the tub or shower. If you need to sit down in the shower, use a plastic, non-slip stool. Keep the floor dry. Clean up any water that spills on the floor as soon as it happens. Remove soap buildup in the tub or shower regularly. Attach bath mats securely with double-sided non-slip rug tape. Do not have throw rugs and other things on the floor that can make you trip. What can I do in the bedroom? Use night lights. Make sure that you have a light by your bed that is easy to reach. Do not use any sheets or blankets that are too big for your bed.  They should not hang down onto the floor. Have a firm chair that has side arms. You can use this for support while you get dressed. Do not have throw rugs and other things on the floor that can make you trip. What can I do in the kitchen? Clean up any spills right away. Avoid walking on wet floors. Keep items that you use a lot in easy-to-reach places. If you need to reach something above you, use a strong step stool that has a grab bar. Keep electrical cords out of the way. Do not use floor polish or wax that makes floors slippery. If you must use wax, use non-skid floor wax. Do not have throw rugs and other things on the floor that can make you trip. What can I do with my stairs? Do not leave any items on the stairs. Make sure that there are handrails on both sides of the stairs and use them. Fix handrails that are broken or loose. Make sure that handrails are as long as the stairways. Check any carpeting to make sure that it is firmly attached to the stairs. Fix any carpet that is loose or worn. Avoid having throw rugs at the top or bottom of the stairs. If you do have throw rugs, attach them to the floor with carpet tape. Make  sure that you have a light switch at the top of the stairs and the bottom of the stairs. If you do not have them, ask someone to add them for you. What else can I do to help prevent falls? Wear shoes that: Do not have high heels. Have rubber bottoms. Are comfortable and fit you well. Are closed at the toe. Do not wear sandals. If you use a stepladder: Make sure that it is fully opened. Do not climb a closed stepladder. Make sure that both sides of the stepladder are locked into place. Ask someone to hold it for you, if possible. Clearly mark and make sure that you can see: Any grab bars or handrails. First and last steps. Where the edge of each step is. Use tools that help you move around (mobility aids) if they are needed. These  include: Canes. Walkers. Scooters. Crutches. Turn on the lights when you go into a dark area. Replace any light bulbs as soon as they burn out. Set up your furniture so you have a clear path. Avoid moving your furniture around. If any of your floors are uneven, fix them. If there are any pets around you, be aware of where they are. Review your medicines with your doctor. Some medicines can make you feel dizzy. This can increase your chance of falling. Ask your doctor what other things that you can do to help prevent falls. This information is not intended to replace advice given to you by your health care provider. Make sure you discuss any questions you have with your health care provider. Document Released: 01/08/2009 Document Revised: 08/20/2015 Document Reviewed: 04/18/2014 Elsevier Interactive Patient Education  2017 [employer].

## 2021-04-12 NOTE — Progress Notes (Signed)
Subjective:   Christopher Henderson is a 76 y.o. male who presents for Medicare Annual/Subsequent preventive examination.  I connected with  Christopher Henderson on 04/12/21 by a telephone enabled telemedicine application and verified that I am speaking with the correct person using two identifiers.   I discussed the limitations of evaluation and management by telemedicine. The patient expressed understanding and agreed to proceed.  Patient location: home  Provider location: Tele-Health  not in clinic    Review of Systems     Cardiac Risk Factors include: advanced age (>60men, >32 women);hypertension;male gender     Objective:    Today's Vitals   There is no height or weight on file to calculate BMI.  Advanced Directives 04/12/2021 04/06/2020 12/31/2018 08/12/2016 06/23/2016 06/19/2012  Does Patient Have a Medical Advance Directive? Yes Yes Yes Yes Yes Patient does not have advance directive  Type of Advance Directive Living will [place];Living will [place];Living will [place];Living will [place];Living will -  Does patient want to make changes to medical advance directive? - - No - Patient declined No - Patient declined - -  Copy of [place] in Chart? No - copy requested No - copy requested No - copy requested No - copy requested No - copy requested -  Pre-existing out of facility DNR order (yellow form or pink MOST form) - - - - - No    Current Medications (verified) Outpatient Encounter Medications as of 04/12/2021  Medication Sig   amLODipine (NORVASC) 5 MG tablet Take 1 tablet (5 mg total) by mouth daily.   Apoaequorin (PREVAGEN) 10 MG CAPS Take 1 capsule by mouth daily.   aspirin 81 MG tablet Take 81 mg by mouth daily.   benazepril (LOTENSIN) 40 MG tablet Take 1 tablet (40 mg total) by mouth daily.   dutasteride (AVODART) 0.5 MG capsule Take 0.5 mg by mouth every other day.    latanoprost (XALATAN) 0.005 % ophthalmic solution Place 1 drop into both eyes at bedtime.   Omega-3 Fatty Acids (FISH OIL) 1000 MG CAPS Take 1,000 mg by mouth 2 (two) times daily.   pantoprazole (PROTONIX) 40 MG tablet Take 1 tablet (40 mg total) by mouth daily.   prednisoLONE acetate (PRED FORTE) 1 % ophthalmic suspension 1 drop 4 (four) times daily.   sildenafil (VIAGRA) 100 MG tablet Take 100 mg by mouth daily as needed for erectile dysfunction.   TURMERIC CURCUMIN PO Take 500 mg by mouth daily.   UNABLE TO FIND Take 20 mg by mouth daily. Eye Shield Plus Lutein   No facility-administered encounter medications on file as of 04/12/2021.    Allergies (verified) Penicillins and Shrimp [shellfish allergy]   History: Past Medical History:  Diagnosis Date   Dental bridge present    also some implants   Hypertension    Senile purpura ([place]) 01/09/2018   Past Surgical History:  Procedure Laterality Date   COLONOSCOPY WITH PROPOFOL N/A 12/31/2018   Procedure: COLONOSCOPY WITH BIOPSY;  Surgeon: Lucilla Lame, MD;  Location: [facility];  Service: Endoscopy;  Laterality: N/A;   ESOPHAGEAL DILATION  06/23/2016   Procedure: ESOPHAGEAL DILATION;  Surgeon: Lucilla Lame, MD;  Location: [place] ;  Service: Endoscopy;;   ESOPHAGEAL DILATION  08/12/2016   Procedure: ESOPHAGEAL DILATION;  Surgeon: Lucilla Lame, MD;  Location: [place];  Service: Endoscopy;;   ESOPHAGOGASTRODUODENOSCOPY (EGD) WITH PROPOFOL N/A 06/23/2016   Procedure: ESOPHAGOGASTRODUODENOSCOPY (  EGD) WITH PROPOFOL;  Surgeon: Lucilla Lame, MD;  Location: [place];  Service: Endoscopy;  Laterality: N/A;   ESOPHAGOGASTRODUODENOSCOPY (EGD) WITH PROPOFOL N/A 08/12/2016   Procedure: ESOPHAGOGASTRODUODENOSCOPY (EGD) WITH PROPOFOL;  Surgeon: Lucilla Lame, MD;  Location: [facility];  Service: Endoscopy;  Laterality: N/A;   EYE SURGERY  2014   cataract Jan and Feb with implants   HAND / FINGER TENDON  LESION EXCISION Left 2000   I & D EXTREMITY Left 06/18/2012   Procedure: Irrigation and Debridement with wound exploration Left Hand and percutaneous pinning of left Fifth finger and third metacarpal;  Surgeon: Schuyler Amor, MD;  Location: [facility];  Service: Orthopedics;  Laterality: Left;   KNEE SURGERY Left 1972   POLYPECTOMY N/A 12/31/2018   Procedure: POLYPECTOMY;  Surgeon: Lucilla Lame, MD;  Location: [facility];  Service: Endoscopy;  Laterality: N/A;   Family History  Problem Relation Age of Onset   Hypertension Mother    Diabetes Mother    Thyroid disease Mother    Glaucoma Father    Cancer Sister        colon and breast   Alzheimer's disease Sister    Diabetes Brother    Heart disease Brother    Seizures Sister    Dementia Sister    Social History   Socioeconomic History   Marital status: Married    Spouse name: Not on file   Number of children: Not on file   Years of education: Not on file   Highest education level: Not on file  Occupational History   Occupation: retired  Tobacco Use   Smoking status: Never   Smokeless tobacco: Never  Vaping Use   Vaping Use: Never used  Substance and Sexual Activity   Alcohol use: No   Drug use: No   Sexual activity: Yes  Other Topics Concern   Not on file  Social History Narrative   Not on file   Social Determinants of Health   Financial Resource Strain: Low Risk    Difficulty of Paying Living Expenses: Not hard at all  Food Insecurity: No Food Insecurity   Worried About Charity fundraiser in the Last Year: Never true   [place] in the Last Year: Never true  Transportation Needs: No Transportation Needs   Lack of Transportation (Medical): No   Lack of Transportation (Non-Medical): No  Physical Activity: Sufficiently Active   Days of Exercise per Week: 5 days   Minutes of Exercise per Session: 60 min  Stress: No Stress Concern Present   Feeling of Stress : Not at all  Social Connections:  Socially Integrated   Frequency of Communication with Friends and Family: More than three times a week   Frequency of Social Gatherings with Friends and Family: More than three times a week   Attends Religious Services: More than 4 times per year   Active Member of [employer] or Organizations: Not on file   Attends Music therapist: More than 4 times per year   Marital Status: Married    Tobacco Counseling Counseling given: Not Answered   Clinical Intake:  Pre-visit preparation completed: Yes  Pain : No/denies pain     Nutritional Risks: None Diabetes: No  How often do you need to have someone help you when you read instructions, pamphlets, or other written materials from your doctor or pharmacy?: 1 - Never  Diabetic?   no  Interpreter Needed?: No  Information entered by ::  Leroy Kennedy LPN   Activities of Daily Living In your present state of health, do you have any difficulty performing the following activities: 04/12/2021 07/20/2020  Hearing? N N  Vision? N N  Difficulty concentrating or making decisions? N N  Walking or climbing stairs? N N  Dressing or bathing? N N  Doing errands, shopping? N N  Preparing Food and eating ? N -  Using the Toilet? N -  In the past six months, have you accidently leaked urine? N -  Do you have problems with loss of bowel control? N -  Managing your Medications? N -  Managing your Finances? N -  Some recent data might be hidden    Patient Care Team: Valerie Roys, DO as PCP - General (Family Medicine)  Indicate any recent Medical Services you may have received from other than [facility] providers in the past year (date may be approximate).     Assessment:   This is a routine wellness examination for [place].  Hearing/Vision screen Hearing Screening - Comments:: Needs hearing aid In both ears  Does not have them Vision Screening - Comments:: Up to date [facility] eye care  Dietary issues and exercise activities  discussed: Current Exercise Habits: Home exercise routine, Type of exercise: walking, Time (Minutes): 60, Frequency (Times/Week): 5, Weekly Exercise (Minutes/Week): 300, Intensity: Moderate   Goals Addressed             This Visit's Progress    Patient Stated   On track    04/06/2020, walk 10,000 steps a day     Patient Stated       Keep current lifestyle       Depression Screen PHQ 2/9 Scores 04/12/2021 01/19/2021 07/20/2020 04/06/2020 04/01/2019  PHQ - 2 Score 0 0 0 0 0  PHQ- 9 Score - 0 - - -  Some encounter information is confidential and restricted. Go to Review Flowsheets activity to see all data.    Fall Risk Fall Risk  04/12/2021 07/20/2020 04/06/2020 04/01/2019  Falls in the past year? 0 0 0 0  Number falls in past yr: 0 0 - 0  Injury with Fall? 0 0 - 0  Risk for fall due to : - No Fall Risks Medication side effect -  Follow up Falls evaluation completed;Falls prevention discussed Falls evaluation completed Falls evaluation completed;Education provided;Falls prevention discussed -  Some encounter information is confidential and restricted. Go to Review Flowsheets activity to see all data.    FALL RISK PREVENTION PERTAINING TO THE HOME:  Any stairs in or around the home? No  If so, are there any without handrails? No  Home free of loose throw rugs in walkways, pet beds, electrical cords, etc? Yes  Adequate lighting in your home to reduce risk of falls? Yes   ASSISTIVE DEVICES UTILIZED TO PREVENT FALLS:  Life alert? No  Use of a cane, walker or w/c? No  Grab bars in the bathroom? Yes  Shower chair or bench in shower? Yes  Elevated toilet seat or a handicapped toilet? Yes   TIMED UP AND GO:  Was the test performed? No .    Cognitive Function:  Normal cognitive status assessed by direct observation by this Nurse Health Advisor. No abnormalities found.       6CIT Screen 04/06/2020 04/01/2019  What Year? 0 points 0 points  What month? 0 points 0 points  What time?  0 points 0 points  Count back from 20 0 points 2 points  Months in reverse 0 points 0 points  Repeat phrase 0 points 0 points  Total Score 0 2    Immunizations Immunization History  Administered Date(s) Administered   Fluad Quad(high Dose 65+) 01/19/2021   Influenza, High Dose Seasonal PF 12/19/2016, 12/28/2017   Influenza,inj,Quad PF,6+ Mos 01/27/2019   Influenza-Unspecified 12/18/2014   PFIZER(Purple Top)SARS-COV-2 Vaccination 05/14/2019, 06/04/2019, 01/09/2020   Pneumococcal Conjugate-13 09/26/2013   Pneumococcal Polysaccharide-23 08/14/2012   Td 03/28/2000, 07/20/2020   Tdap 07/19/2010   Zoster Recombinat (Shingrix) 07/20/2020   Zoster, Live 05/28/2007    TDAP status: Up to date  Flu Vaccine status: Up to date  Pneumococcal vaccine status: Up to date  Covid-19 vaccine status: Information provided on how to obtain vaccines.   Qualifies for Shingles Vaccine? Yes   Zostavax completed Yes   Shingrix Completed?: Yes  Screening Tests Health Maintenance  Topic Date Due   COVID-19 Vaccine (4 - Booster for Pfizer series) 03/05/2020   Zoster Vaccines- Shingrix (2 of 2) 09/14/2020   COLONOSCOPY (Pts 45-58yrs Insurance coverage will need to be confirmed)  12/31/2023   TETANUS/TDAP  07/21/2030   Pneumonia Vaccine 67+ Years old  Completed   INFLUENZA VACCINE  Completed   Hepatitis C Screening  Completed   HPV VACCINES  Aged Out    Health Maintenance  Health Maintenance Due  Topic Date Due   COVID-19 Vaccine (4 - Booster for Pfizer series) 03/05/2020   Zoster Vaccines- Shingrix (2 of 2) 09/14/2020  One of Two for Zoster  Colorectal cancer screening: Type of screening: Colonoscopy. Completed 2020. Repeat every 5 years  Lung Cancer Screening: (Low Dose CT Chest recommended if Age 28-80 years, 30 pack-year currently smoking OR have quit w/in 15years.) does not qualify.   Lung Cancer Screening Referral:   Additional Screening:  Hepatitis C Screening: does not qualify;  Completed 2019  Vision Screening: Recommended annual ophthalmology exams for early detection of glaucoma and other disorders of the eye. Is the patient up to date with their annual eye exam?  Yes  Who is the provider or what is the name of the office in which the patient attends annual eye exams? [facility] If pt is not established with a provider, would they like to be referred to a provider to establish care? No .   Dental Screening: Recommended annual dental exams for proper oral hygiene  Community Resource Referral / Chronic Care Management: CRR required this visit?  No   CCM required this visit?  No      Plan:     I have personally reviewed and noted the following in the patients chart:   Medical and social history Use of alcohol, tobacco or illicit drugs  Current medications and supplements including opioid prescriptions. Patient is not currently taking opioid prescriptions. Functional ability and status Nutritional status Physical activity Advanced directives List of other physicians Hospitalizations, surgeries, and ER visits in previous 12 months Vitals Screenings to include cognitive, depression, and falls Referrals and appointments  In addition, I have reviewed and discussed with patient certain preventive protocols, quality metrics, and best practice recommendations. A written personalized care plan for preventive services as well as general preventive health recommendations were provided to patient.     Leroy Kennedy, LPN   [id number]   Nurse Notes:

## 2021-05-27 ENCOUNTER — Other Ambulatory Visit: Payer: Self-pay

## 2021-05-27 ENCOUNTER — Ambulatory Visit: Payer: Medicare HMO | Admitting: Dermatology

## 2021-05-27 DIAGNOSIS — L814 Other melanin hyperpigmentation: Secondary | ICD-10-CM

## 2021-05-27 DIAGNOSIS — L578 Other skin changes due to chronic exposure to nonionizing radiation: Secondary | ICD-10-CM

## 2021-05-27 DIAGNOSIS — D229 Melanocytic nevi, unspecified: Secondary | ICD-10-CM | POA: Diagnosis not present

## 2021-05-27 DIAGNOSIS — L821 Other seborrheic keratosis: Secondary | ICD-10-CM | POA: Diagnosis not present

## 2021-05-27 DIAGNOSIS — Z1283 Encounter for screening for malignant neoplasm of skin: Secondary | ICD-10-CM | POA: Diagnosis not present

## 2021-05-27 DIAGNOSIS — L905 Scar conditions and fibrosis of skin: Secondary | ICD-10-CM | POA: Diagnosis not present

## 2021-05-27 DIAGNOSIS — L820 Inflamed seborrheic keratosis: Secondary | ICD-10-CM | POA: Diagnosis not present

## 2021-05-27 DIAGNOSIS — L570 Actinic keratosis: Secondary | ICD-10-CM | POA: Diagnosis not present

## 2021-05-27 DIAGNOSIS — D1800 Hemangioma unspecified site: Secondary | ICD-10-CM

## 2021-05-27 DIAGNOSIS — D692 Other nonthrombocytopenic purpura: Secondary | ICD-10-CM

## 2021-05-27 NOTE — Progress Notes (Signed)
? ?Follow-Up Visit ?  ?Subjective  ?Christopher Henderson is a 76 y.o. male who presents for the following: Annual Exam (Mole check ). ?The patient presents for Total-Body Skin Exam (TBSE) for skin cancer screening and mole check.  The patient has spots, moles and lesions to be evaluated, some may be new or changing and the patient has concerns that these could be cancer.  ? ?The following portions of the chart were reviewed this encounter and updated as appropriate:  ? Tobacco  Allergies  Meds  Problems  Med Hx  Surg Hx  Fam Hx   ?  ?Review of Systems:  No other skin or systemic complaints except as noted in HPI or Assessment and Plan. ? ?Objective  ?Well appearing patient in no apparent distress; mood and affect are within normal limits. ? ?A full examination was performed including scalp, head, eyes, ears, nose, lips, neck, chest, axillae, abdomen, back, buttocks, bilateral upper extremities, bilateral lower extremities, hands, feet, fingers, toes, fingernails, and toenails. All findings within normal limits unless otherwise noted below. ? ?left forehead x 2 (2) ?Erythematous thin papules/macules with gritty scale.  ? ?left forehead x 2, crown scalp x 1  (3) (3) ?Stuck-on, waxy, tan-brown papules ? ?R post auricular/mastoid area ?Dyspigmented smooth macule or patch.  ? ? ?Assessment & Plan  ?AK (actinic keratosis) ?left forehead x 2 ?Actinic keratoses are precancerous spots that appear secondary to cumulative UV radiation exposure/sun exposure over time. They are chronic with expected duration over 1 year. A portion of actinic keratoses will progress to squamous cell carcinoma of the skin. It is not possible to reliably predict which spots will progress to skin cancer and so treatment is recommended to prevent development of skin cancer. ? ?Recommend daily broad spectrum sunscreen SPF 30+ to sun-exposed areas, reapply every 2 hours as needed.  ?Recommend staying in the shade or wearing long sleeves, sun glasses  (UVA+UVB protection) and wide brim hats (4-inch brim around the entire circumference of the hat). ?Call for new or changing lesions.  ? ?Destruction of lesion - left forehead x 2 ?Complexity: simple   ?Destruction method: cryotherapy   ?Informed consent: discussed and consent obtained   ?Timeout:  patient name, date of birth, surgical site, and procedure verified ?Lesion destroyed using liquid nitrogen: Yes   ?Region frozen until ice ball extended beyond lesion: Yes   ?Outcome: patient tolerated procedure well with no complications   ?Post-procedure details: wound care instructions given   ? ?Inflamed seborrheic keratosis (3) ?left forehead x 2, crown scalp x 1  (3) ?Irritated and symptomatic ?Reassured benign age-related growth.  Recommend observation.  Discussed cryotherapy if spot(s) become irritated or inflamed.  ? ?Prior to procedure, discussed risks of blister formation, small wound, skin dyspigmentation, or rare scar following cryotherapy. Recommend Vaseline ointment to treated areas while healing.  ? ?Destruction of lesion - left forehead x 2, crown scalp x 1  (3) ?Complexity: simple   ?Destruction method: cryotherapy   ?Informed consent: discussed and consent obtained   ?Timeout:  patient name, date of birth, surgical site, and procedure verified ?Lesion destroyed using liquid nitrogen: Yes   ?Region frozen until ice ball extended beyond lesion: Yes   ?Outcome: patient tolerated procedure well with no complications   ?Post-procedure details: wound care instructions given   ? ?Scar ?R post auricular/mastoid area ?Possible hx skin CA - pathology unavailable. ?Clear. Observe for recurrence. Call clinic for new or changing lesions.  Recommend regular skin exams, daily broad-spectrum  spf 30+ sunscreen use, and photoprotection ? ?Lentigines ?- Scattered tan macules ?- Due to sun exposure ?- Benign-appearing, observe ?- Recommend daily broad spectrum sunscreen SPF 30+ to sun-exposed areas, reapply every 2 hours as  needed. ?- Call for any changes ? ?Seborrheic Keratoses ?- Stuck-on, waxy, tan-brown papules and/or plaques  ?- Benign-appearing ?- Discussed benign etiology and prognosis. ?- Observe ?- Call for any changes ? ?Melanocytic Nevi ?- Tan-brown and/or pink-flesh-colored symmetric macules and papules ?- Benign appearing on exam today ?- Observation ?- Call clinic for new or changing moles ?- Recommend daily use of broad spectrum spf 30+ sunscreen to sun-exposed areas.  ? ?Hemangiomas ?- Red papules ?- Discussed benign nature ?- Observe ?- Call for any changes ? ?Actinic Damage ?- Chronic condition, secondary to cumulative UV/sun exposure ?- diffuse scaly erythematous macules with underlying dyspigmentation ?- Recommend daily broad spectrum sunscreen SPF 30+ to sun-exposed areas, reapply every 2 hours as needed.  ?- Staying in the shade or wearing long sleeves, sun glasses (UVA+UVB protection) and wide brim hats (4-inch brim around the entire circumference of the hat) are also recommended for sun protection.  ?- Call for new or changing lesions. ? ?Purpura - Chronic; persistent and recurrent.  Treatable, but not curable. ?- Violaceous macules and patches ?- Benign ?- Related to trauma, age, sun damage and/or use of blood thinners, chronic use of topical and/or oral steroids ?- Observe ?- Can use OTC arnica containing moisturizer such as Dermend Bruise Formula if desired ?- Call for worsening or other concerns ? ?Skin cancer screening performed today.  ? ?Return in about 1 year (around 05/28/2022) for TBSE, hx of Aks . ? ?I, Marye Round, CMA, am acting as scribe for Sarina Ser, MD .  ?Documentation: I have reviewed the above documentation for accuracy and completeness, and I agree with the above. ? ?Sarina Ser, MD ? ?

## 2021-05-27 NOTE — Patient Instructions (Signed)

## 2021-05-29 ENCOUNTER — Encounter: Payer: Self-pay | Admitting: Dermatology

## 2021-07-19 ENCOUNTER — Other Ambulatory Visit: Payer: Self-pay

## 2021-07-19 DIAGNOSIS — H52223 Regular astigmatism, bilateral: Secondary | ICD-10-CM | POA: Diagnosis not present

## 2021-07-19 DIAGNOSIS — I10 Essential (primary) hypertension: Secondary | ICD-10-CM

## 2021-07-19 MED ORDER — BENAZEPRIL HCL 40 MG PO TABS: 40.0000 mg | ORAL_TABLET | Freq: Every day | ORAL | 0 refills | Status: DC

## 2021-07-19 MED ORDER — AMLODIPINE BESYLATE 5 MG PO TABS: 5.0000 mg | ORAL_TABLET | Freq: Every day | ORAL | 0 refills | Status: DC

## 2021-07-19 NOTE — Telephone Encounter (Signed)
Patient has upcoming appointment on 08/11/21, but will run out of medication before then. Patient asking for refill to make it to appointment.   ?

## 2021-07-20 ENCOUNTER — Encounter: Payer: Medicare HMO | Admitting: Family Medicine

## 2021-08-11 ENCOUNTER — Ambulatory Visit (INDEPENDENT_AMBULATORY_CARE_PROVIDER_SITE_OTHER): Payer: Medicare HMO | Admitting: Family Medicine

## 2021-08-11 ENCOUNTER — Encounter: Payer: Self-pay | Admitting: Family Medicine

## 2021-08-11 VITALS — BP 108/66 | HR 64 | Temp 98.4°F | Wt 206.2 lb

## 2021-08-11 DIAGNOSIS — D692 Other nonthrombocytopenic purpura: Secondary | ICD-10-CM | POA: Diagnosis not present

## 2021-08-11 DIAGNOSIS — N138 Other obstructive and reflux uropathy: Secondary | ICD-10-CM

## 2021-08-11 DIAGNOSIS — K2100 Gastro-esophageal reflux disease with esophagitis, without bleeding: Secondary | ICD-10-CM

## 2021-08-11 DIAGNOSIS — Z136 Encounter for screening for cardiovascular disorders: Secondary | ICD-10-CM

## 2021-08-11 DIAGNOSIS — I10 Essential (primary) hypertension: Secondary | ICD-10-CM

## 2021-08-11 DIAGNOSIS — N401 Enlarged prostate with lower urinary tract symptoms: Secondary | ICD-10-CM | POA: Diagnosis not present

## 2021-08-11 DIAGNOSIS — Z0000 Encounter for general adult medical examination without abnormal findings: Secondary | ICD-10-CM | POA: Diagnosis not present

## 2021-08-11 LAB — MICROALBUMIN, URINE WAIVED
Creatinine, Urine Waived: 200 mg/dL (ref 10–300)
Microalb, Ur Waived: 30 mg/L — ABNORMAL HIGH (ref 0–19)
Microalb/Creat Ratio: <30 mg/g (ref <30)

## 2021-08-11 LAB — URINALYSIS, ROUTINE W REFLEX MICROSCOPIC
Bilirubin, UA: NEGATIVE
Glucose, UA: NEGATIVE
Leukocytes,UA: NEGATIVE
Nitrite, UA: NEGATIVE
Protein,UA: NEGATIVE
RBC, UA: NEGATIVE
Specific Gravity, UA: 1.02 (ref 1.005–1.030)
Urobilinogen, Ur: 1 mg/dL (ref 0.2–1.0)
pH, UA: 7 (ref 5.0–7.5)

## 2021-08-11 MED ORDER — AMLODIPINE BESYLATE 5 MG PO TABS
5.0000 mg | ORAL_TABLET | Freq: Every day | ORAL | 1 refills | Status: DC
Start: 2021-08-11 — End: 2022-02-14

## 2021-08-11 MED ORDER — PANTOPRAZOLE SODIUM 40 MG PO TBEC: 40.0000 mg | DELAYED_RELEASE_TABLET | Freq: Every day | ORAL | 1 refills | Status: DC

## 2021-08-11 MED ORDER — BENAZEPRIL HCL 40 MG PO TABS: 40.0000 mg | ORAL_TABLET | Freq: Every day | ORAL | 1 refills | Status: DC

## 2021-08-11 NOTE — Progress Notes (Signed)
? ?BP 108/66   Pulse 64   Temp 98.4 ?F (36.9 ?C)   Wt 206 lb 3.2 oz (93.5 kg)   SpO2 96%   BMI 28.76 kg/m?   ? ?Subjective:  ? ? Patient ID: Christopher Henderson, male    DOB: 22-Jun-1945, 76 y.o.   MRN: [id number] ? ?HPI: ?Christopher Henderson is a 76 y.o. male presenting on 08/11/2021 for comprehensive medical examination. Current medical complaints include: ? ?HYPERTENSION ?Hypertension status: controlled  ?Satisfied with current treatment? yes ?Duration of hypertension: chronic ?BP monitoring frequency:  not checking ?BP medication side effects:  no ?Medication compliance: excellent compliance ?Previous BP meds: amlodipine, benazepril ?Aspirin: no ?Recurrent headaches: no ?Visual changes: no ?Palpitations: no ?Dyspnea: no ?Chest pain: no ?Lower extremity edema: no ?Dizzy/lightheaded: no ? ?BPH ?BPH status: controlled ?Satisfied with current treatment?: yes ?Medication side effects: no ?Medication compliance: excellent compliance ?Duration: chronic ?Nocturia: no ?Urinary frequency:no ?Incomplete voiding: no ?Urgency: no ?Weak urinary stream: no ?Straining to start stream: no ?Dysuria: no ?Onset: gradual ?Severity: mild ? ?Interim Problems from his last visit: no ? ?Depression Screen done today and results listed below:  ? ?  08/11/2021  ?  9:57 AM 04/12/2021  ? 10:51 AM 01/19/2021  ?  9:16 AM 07/20/2020  ?  9:16 AM 04/06/2020  ?  3:20 PM  ?Depression screen PHQ 2/9  ?Decreased Interest 0 0 0 0 0  ?Down, Depressed, Hopeless 0 0 0 0 0  ?PHQ - 2 Score 0 0 0 0 0  ?Altered sleeping 0  0    ?Tired, decreased energy 0  0    ?Change in appetite 0  0    ?Feeling bad or failure about yourself  0  0    ?Trouble concentrating 0  0    ?Moving slowly or fidgety/restless 0  0    ?Suicidal thoughts 0  0    ?PHQ-9 Score 0  0    ?Difficult doing work/chores Not difficult at all      ? ? ?Past Medical History:  ?Past Medical History:  ?Diagnosis Date  ? Dental bridge present   ? also some implants  ? Hypertension   ? Polyp of ascending colon    ? Senile purpura ([place]) 01/09/2018  ? ? ?Surgical History:  ?Past Surgical History:  ?Procedure Laterality Date  ? COLONOSCOPY WITH PROPOFOL N/A 12/31/2018  ? Procedure: COLONOSCOPY WITH BIOPSY;  Surgeon: Lucilla Lame, MD;  Location: [facility];  Service: Endoscopy;  Laterality: N/A;  ? ESOPHAGEAL DILATION  06/23/2016  ? Procedure: ESOPHAGEAL DILATION;  Surgeon: Lucilla Lame, MD;  Location: [facility];  Service: Endoscopy;;  ? ESOPHAGEAL DILATION  08/12/2016  ? Procedure: ESOPHAGEAL DILATION;  Surgeon: Lucilla Lame, MD;  Location: [facility];  Service: Endoscopy;;  ? ESOPHAGOGASTRODUODENOSCOPY (EGD) WITH PROPOFOL N/A 06/23/2016  ? Procedure: ESOPHAGOGASTRODUODENOSCOPY (EGD) WITH PROPOFOL;  Surgeon: Lucilla Lame, MD;  Location: [facility];  Service: Endoscopy;  Laterality: N/A;  ? ESOPHAGOGASTRODUODENOSCOPY (EGD) WITH PROPOFOL N/A 08/12/2016  ? Procedure: ESOPHAGOGASTRODUODENOSCOPY (EGD) WITH PROPOFOL;  Surgeon: Lucilla Lame, MD;  Location: [facility];  Service: Endoscopy;  Laterality: N/A;  ? EYE SURGERY  2014  ? cataract Jan and Feb with implants  ? HAND / FINGER TENDON LESION EXCISION Left 2000  ? I & D EXTREMITY Left 06/18/2012  ? Procedure: Irrigation and Debridement with wound exploration Left Hand and percutaneous pinning of left Fifth finger and third metacarpal;  Surgeon: Schuyler Amor, MD;  Location:  [facility];  Service: Orthopedics;  Laterality: Left;  ? KNEE SURGERY Left 1972  ? POLYPECTOMY N/A 12/31/2018  ? Procedure: POLYPECTOMY;  Surgeon: Lucilla Lame, MD;  Location: [facility];  Service: Endoscopy;  Laterality: N/A;  ? ? ?Medications:  ?Current Outpatient Medications on File Prior to Visit  ?Medication Sig  ? aspirin 81 MG tablet Take 81 mg by mouth daily.  ? dutasteride (AVODART) 0.5 MG capsule Take 0.5 mg by mouth every other day.  ? latanoprost (XALATAN) 0.005 % ophthalmic solution Place 1 drop into both eyes at bedtime.  ? Omega-3 Fatty Acids (FISH  OIL) 1000 MG CAPS Take 1,000 mg by mouth 2 (two) times daily.  ? prednisoLONE acetate (PRED FORTE) 1 % ophthalmic suspension 1 drop 4 (four) times daily.  ? sildenafil (VIAGRA) 100 MG tablet Take 100 mg by mouth daily as needed for erectile dysfunction.  ? TURMERIC CURCUMIN PO Take 500 mg by mouth daily.  ? UNABLE TO FIND Take 20 mg by mouth daily. Eye Shield Plus Lutein  ? ?No current facility-administered medications on file prior to visit.  ? ? ?Allergies:  ?Allergies  ?Allergen Reactions  ? Penicillins Hives  ? Shrimp [Shellfish Allergy] Hives  ? ? ?Social History:  ?Social History  ? ?Socioeconomic History  ? Marital status: Married  ?  Spouse name: Not on file  ? Number of children: Not on file  ? Years of education: Not on file  ? Highest education level: Not on file  ?Occupational History  ? Occupation: retired  ?Tobacco Use  ? Smoking status: Never  ? Smokeless tobacco: Never  ?Vaping Use  ? Vaping Use: Never used  ?Substance and Sexual Activity  ? Alcohol use: No  ? Drug use: No  ? Sexual activity: Yes  ?Other Topics Concern  ? Not on file  ?Social History Narrative  ? Not on file  ? ?Social Determinants of Health  ? ?Financial Resource Strain: Low Risk   ? Difficulty of Paying Living Expenses: Not hard at all  ?Food Insecurity: No Food Insecurity  ? Worried About Charity fundraiser in the Last Year: Never true  ? Ran Out of Food in the Last Year: Never true  ?Transportation Needs: No Transportation Needs  ? Lack of Transportation (Medical): No  ? Lack of Transportation (Non-Medical): No  ?Physical Activity: Sufficiently Active  ? Days of Exercise per Week: 5 days  ? Minutes of Exercise per Session: 60 min  ?Stress: No Stress Concern Present  ? Feeling of Stress : Not at all  ?Social Connections: Socially Integrated  ? Frequency of Communication with Friends and Family: More than three times a week  ? Frequency of Social Gatherings with Friends and Family: More than three times a week  ? Attends Religious  Services: More than 4 times per year  ? Active Member of Clubs or Organizations: Not on file  ? Attends Archivist Meetings: More than 4 times per year  ? Marital Status: Married  ?Intimate Partner Violence: Not At Risk  ? Fear of Current or Ex-Partner: No  ? Emotionally Abused: No  ? Physically Abused: No  ? Sexually Abused: No  ? ?Social History  ? ?Tobacco Use  ?Smoking Status Never  ?Smokeless Tobacco Never  ? ?Social History  ? ?Substance and Sexual Activity  ?Alcohol Use No  ? ? ?Family History:  ?Family History  ?Problem Relation Age of Onset  ? Hypertension Mother   ? Diabetes Mother   ?  Thyroid disease Mother   ? Glaucoma Father   ? Cancer Sister   ?     colon and breast  ? Alzheimer's disease Sister   ? Diabetes Brother   ? Heart disease Brother   ? Seizures Sister   ? Dementia Sister   ? ? ?Past medical history, surgical history, medications, allergies, family history and social history reviewed with patient today and changes made to appropriate areas of the chart.  ? ?Review of Systems  ?Constitutional: Negative.   ?HENT: Negative.    ?Eyes: Negative.   ?Respiratory: Negative.    ?Cardiovascular: Negative.   ?Gastrointestinal:  Positive for heartburn. Negative for abdominal pain, blood in stool, constipation, diarrhea, melena, nausea and vomiting.  ?Genitourinary: Negative.   ?Musculoskeletal:  Positive for back pain. Negative for falls, joint pain, myalgias and neck pain.  ?Skin: Negative.   ?Neurological: Negative.   ?Endo/Heme/Allergies:  Negative for environmental allergies and polydipsia. Bruises/bleeds easily.  ?Psychiatric/Behavioral: Negative.    ?All other ROS negative except what is listed above and in the HPI.  ? ?   ?Objective:  ?  ?BP 108/66   Pulse 64   Temp 98.4 ?F (36.9 ?C)   Wt 206 lb 3.2 oz (93.5 kg)   SpO2 96%   BMI 28.76 kg/m?   ?Wt Readings from Last 3 Encounters:  ?08/11/21 206 lb 3.2 oz (93.5 kg)  ?01/19/21 202 lb (91.6 kg)  ?07/20/20 207 lb 6.4 oz (94.1 kg)  ?   ?Physical Exam ?Vitals and nursing note reviewed.  ?Constitutional:   ?   General: He is not in acute distress. ?   Appearance: Normal appearance. He is normal weight. He is not ill-appearing, toxic-appearing or diap

## 2021-08-11 NOTE — Assessment & Plan Note (Signed)
Follows with urology. Continue to monitor. Call with any concerns.  ?

## 2021-08-11 NOTE — Assessment & Plan Note (Signed)
Under good control on current regimen. Continue current regimen. Continue to monitor. Call with any concerns. Refills given. Labs drawn today.   

## 2021-08-11 NOTE — Assessment & Plan Note (Signed)
Reassured patient. Continue to monitor.  

## 2021-08-12 LAB — COMPREHENSIVE METABOLIC PANEL
ALT: 17 IU/L (ref 0–44)
AST: 22 IU/L (ref 0–40)
Albumin/Globulin Ratio: 1.9 (ref 1.2–2.2)
Albumin: 4.6 g/dL (ref 3.7–4.7)
Alkaline Phosphatase: 60 IU/L (ref 44–121)
BUN/Creatinine Ratio: 11 (ref 10–24)
BUN: 11 mg/dL (ref 8–27)
Bilirubin Total: 0.7 mg/dL (ref 0.0–1.2)
CO2: 24 mmol/L (ref 20–29)
Calcium: 10.2 mg/dL (ref 8.6–10.2)
Chloride: 102 mmol/L (ref 96–106)
Creatinine, Ser: 1.03 mg/dL (ref 0.76–1.27)
Globulin, Total: 2.4 g/dL (ref 1.5–4.5)
Glucose: 109 mg/dL — ABNORMAL HIGH (ref 70–99)
Potassium: 4.5 mmol/L (ref 3.5–5.2)
Sodium: 140 mmol/L (ref 134–144)
Total Protein: 7 g/dL (ref 6.0–8.5)
eGFR: 76 mL/min/{1.73_m2} (ref >59)

## 2021-08-12 LAB — CBC WITH DIFFERENTIAL/PLATELET
Basophils Absolute: 0 10*3/uL (ref 0.0–0.2)
Basos: 1 %
EOS (ABSOLUTE): 0.8 10*3/uL — ABNORMAL HIGH (ref 0.0–0.4)
Eos: 13 %
Hematocrit: 45.6 % (ref 37.5–51.0)
Hemoglobin: 15.1 g/dL (ref 13.0–17.7)
Immature Grans (Abs): 0 10*3/uL (ref 0.0–0.1)
Immature Granulocytes: 0 %
Lymphocytes Absolute: 1.7 10*3/uL (ref 0.7–3.1)
Lymphs: 27 %
MCH: 30.1 pg (ref 26.6–33.0)
MCHC: 33.1 g/dL (ref 31.5–35.7)
MCV: 91 fL (ref 79–97)
Monocytes Absolute: 0.4 10*3/uL (ref 0.1–0.9)
Monocytes: 7 %
Neutrophils Absolute: 3.3 10*3/uL (ref 1.4–7.0)
Neutrophils: 52 %
Platelets: 223 10*3/uL (ref 150–450)
RBC: 5.01 x10E6/uL (ref 4.14–5.80)
RDW: 12.4 % (ref 11.6–15.4)
WBC: 6.2 10*3/uL (ref 3.4–10.8)

## 2021-08-12 LAB — LIPID PANEL W/O CHOL/HDL RATIO
Cholesterol, Total: 209 mg/dL — ABNORMAL HIGH (ref 100–199)
HDL: 43 mg/dL (ref >39)
LDL Chol Calc (NIH): 147 mg/dL — ABNORMAL HIGH (ref 0–99)
Triglycerides: 105 mg/dL (ref 0–149)
VLDL Cholesterol Cal: 19 mg/dL (ref 5–40)

## 2021-08-12 LAB — TSH: TSH: 2.31 u[IU]/mL (ref 0.450–4.500)

## 2021-09-14 DIAGNOSIS — N401 Enlarged prostate with lower urinary tract symptoms: Secondary | ICD-10-CM | POA: Diagnosis not present

## 2021-09-14 DIAGNOSIS — N5201 Erectile dysfunction due to arterial insufficiency: Secondary | ICD-10-CM | POA: Diagnosis not present

## 2021-09-14 DIAGNOSIS — R972 Elevated prostate specific antigen [PSA]: Secondary | ICD-10-CM | POA: Diagnosis not present

## 2022-01-26 DIAGNOSIS — H401132 Primary open-angle glaucoma, bilateral, moderate stage: Secondary | ICD-10-CM | POA: Diagnosis not present

## 2022-02-14 ENCOUNTER — Ambulatory Visit (INDEPENDENT_AMBULATORY_CARE_PROVIDER_SITE_OTHER): Payer: Medicare HMO | Admitting: Family Medicine

## 2022-02-14 ENCOUNTER — Encounter: Payer: Self-pay | Admitting: Family Medicine

## 2022-02-14 VITALS — BP 123/76 | HR 52 | Temp 98.0°F | Wt 201.5 lb

## 2022-02-14 DIAGNOSIS — I10 Essential (primary) hypertension: Secondary | ICD-10-CM

## 2022-02-14 DIAGNOSIS — K2100 Gastro-esophageal reflux disease with esophagitis, without bleeding: Secondary | ICD-10-CM

## 2022-02-14 DIAGNOSIS — D692 Other nonthrombocytopenic purpura: Secondary | ICD-10-CM | POA: Diagnosis not present

## 2022-02-14 DIAGNOSIS — N401 Enlarged prostate with lower urinary tract symptoms: Secondary | ICD-10-CM | POA: Diagnosis not present

## 2022-02-14 DIAGNOSIS — N138 Other obstructive and reflux uropathy: Secondary | ICD-10-CM | POA: Diagnosis not present

## 2022-02-14 MED ORDER — PANTOPRAZOLE SODIUM 40 MG PO TBEC: 40.0000 mg | DELAYED_RELEASE_TABLET | Freq: Every day | ORAL | 1 refills | Status: DC

## 2022-02-14 MED ORDER — BENAZEPRIL HCL 40 MG PO TABS: 40.0000 mg | ORAL_TABLET | Freq: Every day | ORAL | 1 refills | Status: DC

## 2022-02-14 MED ORDER — AMLODIPINE BESYLATE 5 MG PO TABS: 5.0000 mg | ORAL_TABLET | Freq: Every day | ORAL | 1 refills | Status: DC

## 2022-02-14 NOTE — Assessment & Plan Note (Signed)
Continue to follow with urology. Stable. Continue to monitor.

## 2022-02-14 NOTE — Assessment & Plan Note (Signed)
Under good control on current regimen. Continue current regimen. Continue to monitor. Call with any concerns. Refills given. Labs drawn today.   

## 2022-02-14 NOTE — Assessment & Plan Note (Signed)
Reassured patient. Continue to monitor.  

## 2022-02-14 NOTE — Progress Notes (Signed)
BP 123/76   Pulse (!) 52   Temp 98 F (36.7 C) (Oral)   Wt 201 lb 8 oz (91.4 kg)   SpO2 97%   BMI 28.10 kg/m    Subjective:    Patient ID: Christopher Henderson, male    DOB: 10-May-1945, 76 y.o.   MRN: [id number]  HPI: Christopher Henderson is a 76 y.o. male  Chief Complaint  Patient presents with   Hypertension   Gastroesophageal Reflux   Benign Prostatic Hypertrophy   HYPERTENSION  Hypertension status: controlled  Satisfied with current treatment? yes Duration of hypertension: chronic BP monitoring frequency:  not checking BP medication side effects:  no Medication compliance: excellent compliance Previous BP meds:amlodipine, benazepril Aspirin: yes Recurrent headaches: no Visual changes: no Palpitations: no Dyspnea: no Chest pain: no Lower extremity edema: no Dizzy/lightheaded: no  GERD GERD control status: controlled Satisfied with current treatment? yes Heartburn frequency: rarely Medication side effects: no  Medication compliance: excellent Previous GERD medications: Dysphagia: no Odynophagia:  no Hematemesis: no Blood in stool: no EGD: no  BPH BPH status: controlled Satisfied with current treatment?: yes Medication side effects: yes Medication compliance: excellent compliance Duration: chronic Nocturia: 1/night Urinary frequency:no Incomplete voiding: no Urgency: no Weak urinary stream: no Straining to start stream: no Dysuria: no Onset: gradual Severity: mild   Relevant past medical, surgical, family and social history reviewed and updated as indicated. Interim medical history since our last visit reviewed. Allergies and medications reviewed and updated.  Review of Systems  Constitutional: Negative.   Respiratory: Negative.    Cardiovascular: Negative.   Gastrointestinal: Negative.   Musculoskeletal: Negative.   Neurological: Negative.   Psychiatric/Behavioral: Negative.      Per HPI unless specifically indicated above     Objective:     BP 123/76   Pulse (!) 52   Temp 98 F (36.7 C) (Oral)   Wt 201 lb 8 oz (91.4 kg)   SpO2 97%   BMI 28.10 kg/m   Wt Readings from Last 3 Encounters:  02/14/22 201 lb 8 oz (91.4 kg)  08/11/21 206 lb 3.2 oz (93.5 kg)  01/19/21 202 lb (91.6 kg)    Physical Exam Vitals and nursing note reviewed.  Constitutional:      General: He is not in acute distress.    Appearance: Normal appearance. He is not ill-appearing, toxic-appearing or diaphoretic.  HENT:     Head: Normocephalic and atraumatic.     Right Ear: External ear normal.     Left Ear: External ear normal.     Nose: Nose normal.     Mouth/Throat:     Mouth: Mucous membranes are moist.     Pharynx: Oropharynx is clear.  Eyes:     General: No scleral icterus.       Right eye: No discharge.        Left eye: No discharge.     Extraocular Movements: Extraocular movements intact.     Conjunctiva/sclera: Conjunctivae normal.     Pupils: Pupils are equal, round, and reactive to light.  Cardiovascular:     Rate and Rhythm: Normal rate and regular rhythm.     Pulses: Normal pulses.     Heart sounds: Normal heart sounds. No murmur heard.    No friction rub. No gallop.  Pulmonary:     Effort: Pulmonary effort is normal. No respiratory distress.     Breath sounds: Normal breath sounds. No stridor. No wheezing, rhonchi or rales.  Chest:  Chest wall: No tenderness.  Musculoskeletal:        General: Normal range of motion.     Cervical back: Normal range of motion and neck supple.  Skin:    General: Skin is warm and dry.     Capillary Refill: Capillary refill takes less than 2 seconds.     Coloration: Skin is not jaundiced or pale.     Findings: No bruising, erythema, lesion or rash.  Neurological:     General: No focal deficit present.     Mental Status: He is alert and oriented to person, place, and time. Mental status is at baseline.  Psychiatric:        Mood and Affect: Mood normal.        Behavior: Behavior normal.         Thought Content: Thought content normal.        Judgment: Judgment normal.     Results for orders placed or performed in visit on 08/11/21  Comprehensive metabolic panel  Result Value Ref Range   Glucose 109 (H) 70 - 99 mg/dL   BUN 11 8 - 27 mg/dL   Creatinine, Ser 1.03 0.76 - 1.27 mg/dL   eGFR 76 >59 mL/min/1.73   BUN/Creatinine Ratio 11 10 - 24   Sodium 140 134 - 144 mmol/L   Potassium 4.5 3.5 - 5.2 mmol/L   Chloride 102 96 - 106 mmol/L   CO2 24 20 - 29 mmol/L   Calcium 10.2 8.6 - 10.2 mg/dL   Total Protein 7.0 6.0 - 8.5 g/dL   Albumin 4.6 3.7 - 4.7 g/dL   Globulin, Total 2.4 1.5 - 4.5 g/dL   Albumin/Globulin Ratio 1.9 1.2 - 2.2   Bilirubin Total 0.7 0.0 - 1.2 mg/dL   Alkaline Phosphatase 60 44 - 121 IU/L   AST 22 0 - 40 IU/L   ALT 17 0 - 44 IU/L  CBC with Differential/Platelet  Result Value Ref Range   WBC 6.2 3.4 - 10.8 x10E3/uL   RBC 5.01 4.14 - 5.80 x10E6/uL   Hemoglobin 15.1 13.0 - 17.7 g/dL   Hematocrit 45.6 37.5 - 51.0 %   MCV 91 79 - 97 fL   MCH 30.1 26.6 - 33.0 pg   MCHC 33.1 31.5 - 35.7 g/dL   RDW 12.4 11.6 - 15.4 %   Platelets 223 150 - 450 x10E3/uL   Neutrophils 52 Not Estab. %   Lymphs 27 Not Estab. %   Monocytes 7 Not Estab. %   Eos 13 Not Estab. %   Basos 1 Not Estab. %   Neutrophils Absolute 3.3 1.4 - 7.0 x10E3/uL   Lymphocytes Absolute 1.7 0.7 - 3.1 x10E3/uL   Monocytes Absolute 0.4 0.1 - 0.9 x10E3/uL   EOS (ABSOLUTE) 0.8 (H) 0.0 - 0.4 x10E3/uL   Basophils Absolute 0.0 0.0 - 0.2 x10E3/uL   Immature Granulocytes 0 Not Estab. %   Immature Grans (Abs) 0.0 0.0 - 0.1 x10E3/uL  Lipid Panel w/o Chol/HDL Ratio  Result Value Ref Range   Cholesterol, Total 209 (H) 100 - 199 mg/dL   Triglycerides 105 0 - 149 mg/dL   HDL 43 >39 mg/dL   VLDL Cholesterol Cal 19 5 - 40 mg/dL   LDL Chol Calc (NIH) 147 (H) 0 - 99 mg/dL  TSH  Result Value Ref Range   TSH 2.310 0.450 - 4.500 uIU/mL  Urinalysis, Routine w reflex microscopic  Result Value Ref Range    Specific Gravity, UA 1.020 1.005 - 1.030  pH, UA 7.0 5.0 - 7.5   Color, UA Yellow Yellow   Appearance Ur Clear Clear   Leukocytes,UA Negative Negative   Protein,UA Negative Negative/Trace   Glucose, UA Negative Negative   Ketones, UA Trace (A) Negative   RBC, UA Negative Negative   Bilirubin, UA Negative Negative   Urobilinogen, Ur 1.0 0.2 - 1.0 mg/dL   Nitrite, UA Negative Negative  Microalbumin, Urine Waived  Result Value Ref Range   Microalb, Ur Waived 30 (H) 0 - 19 mg/L   Creatinine, Urine Waived 200 10 - 300 mg/dL   Microalb/Creat Ratio <30 <30 mg/g      Assessment & Plan:   Problem List Items Addressed This Visit       Cardiovascular and Mediastinum   Essential hypertension - Primary    Under good control on current regimen. Continue current regimen. Continue to monitor. Call with any concerns. Refills given. Labs drawn today.        Relevant Medications   amLODipine (NORVASC) 5 MG tablet   benazepril (LOTENSIN) 40 MG tablet   Other Relevant Orders   Basic metabolic panel   Senile purpura ([place])    Reassured patient. Continue to monitor.       Relevant Medications   amLODipine (NORVASC) 5 MG tablet   benazepril (LOTENSIN) 40 MG tablet   Other Relevant Orders   CBC with Differential/Platelet     Digestive   Reflux esophagitis    Under good control on current regimen. Continue current regimen. Continue to monitor. Call with any concerns. Refills given. Labs drawn today.        Relevant Orders   CBC with Differential/Platelet     Genitourinary   BPH with obstruction/lower urinary tract symptoms    Continue to follow with urology. Stable. Continue to monitor.         Follow up plan: Return in about 6 months (around 08/15/2022) for physical.

## 2022-02-15 LAB — CBC WITH DIFFERENTIAL/PLATELET
Basophils Absolute: 0 10*3/uL (ref 0.0–0.2)
Basos: 1 %
EOS (ABSOLUTE): 0.3 10*3/uL (ref 0.0–0.4)
Eos: 6 %
Hematocrit: 44.6 % (ref 37.5–51.0)
Hemoglobin: 15 g/dL (ref 13.0–17.7)
Immature Grans (Abs): 0 10*3/uL (ref 0.0–0.1)
Immature Granulocytes: 0 %
Lymphocytes Absolute: 1.7 10*3/uL (ref 0.7–3.1)
Lymphs: 35 %
MCH: 30.4 pg (ref 26.6–33.0)
MCHC: 33.6 g/dL (ref 31.5–35.7)
MCV: 91 fL (ref 79–97)
Monocytes Absolute: 0.4 10*3/uL (ref 0.1–0.9)
Monocytes: 8 %
Neutrophils Absolute: 2.3 10*3/uL (ref 1.4–7.0)
Neutrophils: 50 %
Platelets: 198 10*3/uL (ref 150–450)
RBC: 4.93 x10E6/uL (ref 4.14–5.80)
RDW: 12 % (ref 11.6–15.4)
WBC: 4.7 10*3/uL (ref 3.4–10.8)

## 2022-02-15 LAB — BASIC METABOLIC PANEL
BUN/Creatinine Ratio: 12 (ref 10–24)
BUN: 12 mg/dL (ref 8–27)
CO2: 25 mmol/L (ref 20–29)
Calcium: 9.6 mg/dL (ref 8.6–10.2)
Chloride: 102 mmol/L (ref 96–106)
Creatinine, Ser: 0.97 mg/dL (ref 0.76–1.27)
Glucose: 99 mg/dL (ref 70–99)
Potassium: 4.3 mmol/L (ref 3.5–5.2)
Sodium: 140 mmol/L (ref 134–144)
eGFR: 81 mL/min/{1.73_m2} (ref >59)

## 2022-03-18 DIAGNOSIS — Z91013 Allergy to seafood: Secondary | ICD-10-CM | POA: Diagnosis not present

## 2022-03-18 DIAGNOSIS — I10 Essential (primary) hypertension: Secondary | ICD-10-CM | POA: Diagnosis not present

## 2022-03-18 DIAGNOSIS — Z8249 Family history of ischemic heart disease and other diseases of the circulatory system: Secondary | ICD-10-CM | POA: Diagnosis not present

## 2022-03-18 DIAGNOSIS — N400 Enlarged prostate without lower urinary tract symptoms: Secondary | ICD-10-CM | POA: Diagnosis not present

## 2022-03-18 DIAGNOSIS — K219 Gastro-esophageal reflux disease without esophagitis: Secondary | ICD-10-CM | POA: Diagnosis not present

## 2022-03-18 DIAGNOSIS — Z809 Family history of malignant neoplasm, unspecified: Secondary | ICD-10-CM | POA: Diagnosis not present

## 2022-03-18 DIAGNOSIS — Z833 Family history of diabetes mellitus: Secondary | ICD-10-CM | POA: Diagnosis not present

## 2022-03-18 DIAGNOSIS — Z880 Allergy status to penicillin: Secondary | ICD-10-CM | POA: Diagnosis not present

## 2022-03-18 DIAGNOSIS — H409 Unspecified glaucoma: Secondary | ICD-10-CM | POA: Diagnosis not present

## 2022-03-18 DIAGNOSIS — Z008 Encounter for other general examination: Secondary | ICD-10-CM | POA: Diagnosis not present

## 2022-03-18 DIAGNOSIS — R69 Illness, unspecified: Secondary | ICD-10-CM | POA: Diagnosis not present

## 2022-04-18 DIAGNOSIS — H903 Sensorineural hearing loss, bilateral: Secondary | ICD-10-CM | POA: Diagnosis not present

## 2022-04-19 DIAGNOSIS — H903 Sensorineural hearing loss, bilateral: Secondary | ICD-10-CM | POA: Diagnosis not present

## 2022-05-06 DIAGNOSIS — H903 Sensorineural hearing loss, bilateral: Secondary | ICD-10-CM | POA: Diagnosis not present

## 2022-05-12 ENCOUNTER — Telehealth: Payer: Self-pay | Admitting: Family Medicine

## 2022-05-12 NOTE — Telephone Encounter (Signed)
Spoke with patient to sched AWV he req CB in 07/2022 he work out of town

## 2022-06-02 ENCOUNTER — Ambulatory Visit: Payer: Medicare HMO | Admitting: Dermatology

## 2022-06-02 ENCOUNTER — Encounter: Payer: Self-pay | Admitting: Dermatology

## 2022-06-02 VITALS — BP 140/82 | HR 70

## 2022-06-02 DIAGNOSIS — Z1283 Encounter for screening for malignant neoplasm of skin: Secondary | ICD-10-CM | POA: Diagnosis not present

## 2022-06-02 DIAGNOSIS — L821 Other seborrheic keratosis: Secondary | ICD-10-CM

## 2022-06-02 DIAGNOSIS — D229 Melanocytic nevi, unspecified: Secondary | ICD-10-CM

## 2022-06-02 DIAGNOSIS — L720 Epidermal cyst: Secondary | ICD-10-CM

## 2022-06-02 DIAGNOSIS — D1801 Hemangioma of skin and subcutaneous tissue: Secondary | ICD-10-CM

## 2022-06-02 DIAGNOSIS — L578 Other skin changes due to chronic exposure to nonionizing radiation: Secondary | ICD-10-CM

## 2022-06-02 DIAGNOSIS — Z872 Personal history of diseases of the skin and subcutaneous tissue: Secondary | ICD-10-CM | POA: Diagnosis not present

## 2022-06-02 DIAGNOSIS — D692 Other nonthrombocytopenic purpura: Secondary | ICD-10-CM | POA: Diagnosis not present

## 2022-06-02 DIAGNOSIS — L814 Other melanin hyperpigmentation: Secondary | ICD-10-CM | POA: Diagnosis not present

## 2022-06-02 NOTE — Patient Instructions (Signed)
Due to recent changes in healthcare laws, you may see results of your pathology and/or laboratory studies on MyChart before the doctors have had a chance to review them. We understand that in some cases there may be results that are confusing or concerning to you. Please understand that not all results are received at the same time and often the doctors may need to interpret multiple results in order to provide you with the best plan of care or course of treatment. Therefore, we ask that you please give us 2 business days to thoroughly review all your results before contacting the office for clarification. Should we see a critical lab result, you will be contacted sooner.   If You Need Anything After Your Visit  If you have any questions or concerns for your doctor, please call our main line at [phone] and press option 4 to reach your doctor's medical assistant. If no one answers, please leave a voicemail as directed and we will return your call as soon as possible. Messages left after 4 pm will be answered the following business day.   You may also send us a message via MyChart. We typically respond to MyChart messages within 1-2 business days.  For prescription refills, please ask your pharmacy to contact our office. Our fax number is [phone].  If you have an urgent issue when the clinic is closed that cannot wait until the next business day, you can page your doctor at the number below.    Please note that while we do our best to be available for urgent issues outside of office hours, we are not available 24/7.   If you have an urgent issue and are unable to reach us, you may choose to seek medical care at your doctor's office, retail clinic, urgent care center, or emergency room.  If you have a medical emergency, please immediately call 911 or go to the emergency department.  Pager Numbers  - Dr. Kowalski: [phone]  - Dr. Moye: [phone]  - Dr. Stewart:  [phone]  In the event of inclement weather, please call our main line at [phone] for an update on the status of any delays or closures.  Dermatology Medication Tips: Please keep the boxes that topical medications come in in order to help keep track of the instructions about where and how to use these. Pharmacies typically print the medication instructions only on the boxes and not directly on the medication tubes.   If your medication is too expensive, please contact our office at [phone] option 4 or send us a message through MyChart.   We are unable to tell what your co-pay for medications will be in advance as this is different depending on your insurance coverage. However, we may be able to find a substitute medication at lower cost or fill out paperwork to get insurance to cover a needed medication.   If a prior authorization is required to get your medication covered by your insurance company, please allow us 1-2 business days to complete this process.  Drug prices often vary depending on where the prescription is filled and some pharmacies may offer cheaper prices.  The website www.goodrx.com contains coupons for medications through different pharmacies. The prices here do not account for what the cost may be with help from insurance (it may be cheaper with your insurance), but the website can give you the price if you did not use any insurance.  - You can print the associated coupon and take it with   your prescription to the pharmacy.  - You may also stop by our office during regular business hours and pick up a GoodRx coupon card.  - If you need your prescription sent electronically to a different pharmacy, notify our office through [facility] MyChart or by phone at [phone] option 4.     Si Usted Necesita Algo Despus de Su Visita  Tambin puede enviarnos un mensaje a travs de MyChart. Por lo general respondemos a los mensajes de MyChart en el transcurso de 1 a 2  das hbiles.  Para renovar recetas, por favor pida a su farmacia que se ponga en contacto con nuestra oficina. Nuestro nmero de fax es el [phone].  Si tiene un asunto urgente cuando la clnica est cerrada y que no puede esperar hasta el siguiente da hbil, puede llamar/localizar a su doctor(a) al nmero que aparece a continuacin.   Por favor, tenga en cuenta que aunque hacemos todo lo posible para estar disponibles para asuntos urgentes fuera del horario de oficina, no estamos disponibles las 24 horas del da, los 7 das de la semana.   Si tiene un problema urgente y no puede comunicarse con nosotros, puede optar por buscar atencin mdica  en el consultorio de su doctor(a), en una clnica privada, en un centro de atencin urgente o en una sala de emergencias.  Si tiene una emergencia mdica, por favor llame inmediatamente al 911 o vaya a la sala de emergencias.  Nmeros de bper  - Dr. Kowalski: [phone]  - Dra. Moye: [phone]  - Dra. Stewart: [phone]  En caso de inclemencias del tiempo, por favor llame a nuestra lnea principal al [phone] para una actualizacin sobre el estado de cualquier retraso o cierre.  Consejos para la medicacin en dermatologa: Por favor, guarde las cajas en las que vienen los medicamentos de uso tpico para ayudarle a seguir las instrucciones sobre dnde y cmo usarlos. Las farmacias generalmente imprimen las instrucciones del medicamento slo en las cajas y no directamente en los tubos del medicamento.   Si su medicamento es muy caro, por favor, pngase en contacto con nuestra oficina llamando al [phone] y presione la opcin 4 o envenos un mensaje a travs de MyChart.   No podemos decirle cul ser su copago por los medicamentos por adelantado ya que esto es diferente dependiendo de la cobertura de su seguro. Sin embargo, es posible que podamos encontrar un medicamento sustituto a menor costo o llenar un formulario para que el  seguro cubra el medicamento que se considera necesario.   Si se requiere una autorizacin previa para que su compaa de seguros cubra su medicamento, por favor permtanos de 1 a 2 das hbiles para completar este proceso.  Los precios de los medicamentos varan con frecuencia dependiendo del lugar de dnde se surte la receta y alguna farmacias pueden ofrecer precios ms baratos.  El sitio web www.goodrx.com tiene cupones para medicamentos de diferentes farmacias. Los precios aqu no tienen en cuenta lo que podra costar con la ayuda del seguro (puede ser ms barato con su seguro), pero el sitio web puede darle el precio si no utiliz ningn seguro.  - Puede imprimir el cupn correspondiente y llevarlo con su receta a la farmacia.  - Tambin puede pasar por nuestra oficina durante el horario de atencin regular y recoger una tarjeta de cupones de GoodRx.  - Si necesita que su receta se enve electrnicamente a una farmacia diferente, informe a nuestra oficina a travs de MyChart de [facility]   o por telfono llamando al [phone] y presione la opcin 4.  

## 2022-06-02 NOTE — Progress Notes (Signed)
Follow-Up Visit   Subjective  Christopher Henderson is a 77 y.o. male who presents for the following: Total body skin exam (Hx of AKs) and check spots (Pimple L upper eyelid, L post ear, ~67m. The patient presents for Total-Body Skin Exam (TBSE) for skin cancer screening and mole check.  The patient has spots, moles and lesions to be evaluated, some may be new or changing and the patient has concerns that these could be cancer.   The following portions of the chart were reviewed this encounter and updated as appropriate:   Tobacco  Allergies  Meds  Problems  Med Hx  Surg Hx  Fam Hx     Review of Systems:  No other skin or systemic complaints except as noted in HPI or Assessment and Plan.  Objective  Well appearing patient in no apparent distress; mood and affect are within normal limits.  A full examination was performed including scalp, head, eyes, ears, nose, lips, neck, chest, axillae, abdomen, back, buttocks, bilateral upper extremities, bilateral lower extremities, hands, feet, fingers, toes, fingernails, and toenails. All findings within normal limits unless otherwise noted below.  L upper eyelid, L post ear White paps   Assessment & Plan   Lentigines - Scattered tan macules - Due to sun exposure - Benign-appearing, observe - Recommend daily broad spectrum sunscreen SPF 30+ to sun-exposed areas, reapply every 2 hours as needed. - Call for any changes - back  Seborrheic Keratoses - Stuck-on, waxy, tan-brown papules and/or plaques  - Benign-appearing - Discussed benign etiology and prognosis. - Observe - Call for any changes - arms, back  Melanocytic Nevi - Tan-brown and/or pink-flesh-colored symmetric macules and papules - Benign appearing on exam today - Observation - Call clinic for new or changing moles - Recommend daily use of broad spectrum spf 30+ sunscreen to sun-exposed areas.  - trunk  Hemangiomas - Red papules - Discussed benign nature - Observe -  Call for any changes - trunk  Actinic Damage - Chronic condition, secondary to cumulative UV/sun exposure - diffuse scaly erythematous macules with underlying dyspigmentation - Recommend daily broad spectrum sunscreen SPF 30+ to sun-exposed areas, reapply every 2 hours as needed.  - Staying in the shade or wearing long sleeves, sun glasses (UVA+UVB protection) and wide brim hats (4-inch brim around the entire circumference of the hat) are also recommended for sun protection.  - Call for new or changing lesions.  History of PreCancerous Actinic Keratosis  - site(s) of PreCancerous Actinic Keratosis clear today. - these may recur and new lesions may form requiring treatment to prevent transformation into skin cancer - observe for new or changing spots and contact AGuthriefor appointment if occur - photoprotection with sun protective clothing; sunglasses and broad spectrum sunscreen with SPF of at least 30 + and frequent self skin exams recommended - yearly exams by a dermatologist recommended for persons with history of PreCancerous Actinic Keratoses   Skin cancer screening performed today.   Milia L upper eyelid, L post ear Benign, observe Discussed if gets larger and irritating can treat  Purpura - Chronic; persistent and recurrent.  Treatable, but not curable. - Violaceous macules and patches - Benign - Related to trauma, age, sun damage and/or use of blood thinners, chronic use of topical and/or oral steroids - Observe - Can use OTC arnica containing moisturizer such as Dermend Bruise Formula if desired - Call for worsening or other concerns   Varicose Veins/Spider Veins - Dilated blue, purple or  red veins at the lower extremities - Reassured - Smaller vessels can be treated by sclerotherapy (a procedure to inject a medicine into the veins to make them disappear) if desired, but the treatment is not covered by insurance. Larger vessels may be covered if symptomatic  and we would refer to vascular surgeon if treatment desired.  - legs  Return in about 1 year (around 06/02/2023) for TBSE, Hx of AKs.  I, Othelia Pulling, RMA, am acting as scribe for Sarina Ser, MD . Documentation: I have reviewed the above documentation for accuracy and completeness, and I agree with the above.  Sarina Ser, MD

## 2022-06-05 ENCOUNTER — Encounter: Payer: Self-pay | Admitting: Dermatology

## 2022-06-29 DIAGNOSIS — H401132 Primary open-angle glaucoma, bilateral, moderate stage: Secondary | ICD-10-CM | POA: Diagnosis not present

## 2022-06-29 DIAGNOSIS — H524 Presbyopia: Secondary | ICD-10-CM | POA: Diagnosis not present

## 2022-07-28 ENCOUNTER — Telehealth: Payer: Self-pay | Admitting: Family Medicine

## 2022-07-28 NOTE — Telephone Encounter (Signed)
Contacted Phill Myron to schedule their annual wellness visit. Patient declined to schedule AWV at this time.  Verlee Rossetti; Care Guide Ambulatory Clinical Support [place] [facility] Direct Dial: [phone]

## 2022-08-16 ENCOUNTER — Ambulatory Visit (INDEPENDENT_AMBULATORY_CARE_PROVIDER_SITE_OTHER): Payer: Medicare HMO | Admitting: Family Medicine

## 2022-08-16 ENCOUNTER — Encounter: Payer: Self-pay | Admitting: Family Medicine

## 2022-08-16 VITALS — BP 115/75 | HR 66 | Temp 97.7°F | Ht 72.5 in | Wt 205.0 lb

## 2022-08-16 DIAGNOSIS — N138 Other obstructive and reflux uropathy: Secondary | ICD-10-CM

## 2022-08-16 DIAGNOSIS — I10 Essential (primary) hypertension: Secondary | ICD-10-CM

## 2022-08-16 DIAGNOSIS — R739 Hyperglycemia, unspecified: Secondary | ICD-10-CM

## 2022-08-16 DIAGNOSIS — D692 Other nonthrombocytopenic purpura: Secondary | ICD-10-CM

## 2022-08-16 DIAGNOSIS — Z13220 Encounter for screening for lipoid disorders: Secondary | ICD-10-CM | POA: Diagnosis not present

## 2022-08-16 DIAGNOSIS — Z0000 Encounter for general adult medical examination without abnormal findings: Secondary | ICD-10-CM | POA: Diagnosis not present

## 2022-08-16 DIAGNOSIS — N401 Enlarged prostate with lower urinary tract symptoms: Secondary | ICD-10-CM | POA: Diagnosis not present

## 2022-08-16 LAB — URINALYSIS, ROUTINE W REFLEX MICROSCOPIC
Bilirubin, UA: NEGATIVE
Glucose, UA: NEGATIVE
Leukocytes,UA: NEGATIVE
Nitrite, UA: NEGATIVE
RBC, UA: NEGATIVE
Specific Gravity, UA: 1.025 (ref 1.005–1.030)
Urobilinogen, Ur: 1 mg/dL (ref 0.2–1.0)
pH, UA: 5 (ref 5.0–7.5)

## 2022-08-16 LAB — MICROALBUMIN, URINE WAIVED
Creatinine, Urine Waived: 200 mg/dL (ref 10–300)
Microalb, Ur Waived: 30 mg/L — ABNORMAL HIGH (ref 0–19)
Microalb/Creat Ratio: <30 mg/g (ref <30)

## 2022-08-16 LAB — BAYER DCA HB A1C WAIVED: HB A1C (BAYER DCA - WAIVED): 6.4 % — ABNORMAL HIGH (ref 4.8–5.6)

## 2022-08-16 MED ORDER — PANTOPRAZOLE SODIUM 40 MG PO TBEC: 40.0000 mg | DELAYED_RELEASE_TABLET | Freq: Every day | ORAL | 1 refills | Status: DC

## 2022-08-16 MED ORDER — AMLODIPINE BESYLATE 5 MG PO TABS: 5.0000 mg | ORAL_TABLET | Freq: Every day | ORAL | 1 refills | Status: DC

## 2022-08-16 MED ORDER — BENAZEPRIL HCL 40 MG PO TABS: 40.0000 mg | ORAL_TABLET | Freq: Every day | ORAL | 1 refills | Status: DC

## 2022-08-16 NOTE — Patient Instructions (Signed)
Preventative Services:  Health Risk Assessment and Personalized Prevention Plan: Done today Bone Mass Measurements: N/A CVD Screening: Done today Colon Cancer Screening: Up to date Depression Screening: Done today Diabetes Screening: Done today Glaucoma Screening: See your eye doctor Hepatitis B vaccine: N/A Hepatitis C screening: up to date HIV Screening: up to date Flu Vaccine: get in the fall Lung cancer Screening: N/A Obesity Screening: Done today Pneumonia Vaccines (2): up to date STI Screening: N/A PSA screening: gets through urology

## 2022-08-16 NOTE — Progress Notes (Signed)
BP 115/75   Pulse 66   Temp 97.7 F (36.5 C) (Oral)   Ht 6' 0.5" (1.842 m)   Wt 205 lb (93 kg)   SpO2 97%   BMI 27.42 kg/m    Subjective:    Patient ID: Christopher Henderson, male    DOB: 02/26/1946, 77 y.o.   MRN: [id number]  HPI: Christopher Henderson is a 77 y.o. male presenting on 08/16/2022 for comprehensive medical examination. Current medical complaints include:  HYPERTENSION  Hypertension status: controlled  Satisfied with current treatment? yes Duration of hypertension: chronic BP medication side effects:  no Medication compliance: excellent compliance Previous BP meds:amlodipine, benazepril Aspirin: yes Recurrent headaches: no Visual changes: no Palpitations: no Dyspnea: no Chest pain: no Lower extremity edema: no Dizzy/lightheaded: no  BPH BPH status: controlled Satisfied with current treatment?: yes Medication side effects: no Medication compliance: excellent compliance Duration: chronic Nocturia: 1/night Urinary frequency:no Incomplete voiding: no Urgency: no Weak urinary stream: no Straining to start stream: no Dysuria: no Onset: gradual Severity: mild  He currently lives with: wife Interim Problems from his last visit: no  Functional Status Survey: Is the patient deaf or have difficulty hearing?: No Does the patient have difficulty seeing, even when wearing glasses/contacts?: No Does the patient have difficulty concentrating, remembering, or making decisions?: No Does the patient have difficulty walking or climbing stairs?: No Does the patient have difficulty dressing or bathing?: No Does the patient have difficulty doing errands alone such as visiting a doctor's office or shopping?: No  FALL RISK:    08/16/2022    8:11 AM 02/14/2022    9:02 AM 08/11/2021    9:57 AM 04/12/2021   10:41 AM 07/20/2020    9:16 AM  Fall Risk   Falls in the past year? 0 0 0 0 0  Number falls in past yr: 0 0 0 0 0  Injury with Fall? 0 0 0 0 0  Risk for fall due to :  No Fall Risks No Fall Risks No Fall Risks  No Fall Risks  Follow up Falls evaluation completed Falls evaluation completed Falls evaluation completed Falls evaluation completed;Falls prevention discussed Falls evaluation completed    Depression Screen    08/16/2022    8:11 AM 02/14/2022    9:02 AM 08/11/2021    9:57 AM 04/12/2021   10:51 AM 01/19/2021    9:16 AM  Depression screen PHQ 2/9  Decreased Interest 0 0 0 0 0  Down, Depressed, Hopeless 0 0 0 0 0  PHQ - 2 Score 0 0 0 0 0  Altered sleeping 0 0 0  0  Tired, decreased energy 0 0 0  0  Change in appetite 0 0 0  0  Feeling bad or failure about yourself  0 0 0  0  Trouble concentrating 0 0 0  0  Moving slowly or fidgety/restless 0 0 0  0  Suicidal thoughts 0 0 0  0  PHQ-9 Score 0 0 0  0  Difficult doing work/chores Not difficult at all Not difficult at all Not difficult at all      Past Medical History:  Past Medical History:  Diagnosis Date   Actinic keratosis    Dental bridge present    also some implants   Hypertension    Polyp of ascending colon    Senile purpura (HCC) 01/09/2018    Surgical History:  Past Surgical History:  Procedure Laterality Date   COLONOSCOPY WITH PROPOFOL N/A 12/31/2018  Procedure: COLONOSCOPY WITH BIOPSY;  Surgeon: Midge Minium, MD;  Location: [facility];  Service: Endoscopy;  Laterality: N/A;   ESOPHAGEAL DILATION  06/23/2016   Procedure: ESOPHAGEAL DILATION;  Surgeon: Midge Minium, MD;  Location: [facility];  Service: Endoscopy;;   ESOPHAGEAL DILATION  08/12/2016   Procedure: ESOPHAGEAL DILATION;  Surgeon: Midge Minium, MD;  Location: [facility];  Service: Endoscopy;;   ESOPHAGOGASTRODUODENOSCOPY (EGD) WITH PROPOFOL N/A 06/23/2016   Procedure: ESOPHAGOGASTRODUODENOSCOPY (EGD) WITH PROPOFOL;  Surgeon: Midge Minium, MD;  Location: [facility];  Service: Endoscopy;  Laterality: N/A;   ESOPHAGOGASTRODUODENOSCOPY (EGD) WITH PROPOFOL N/A 08/12/2016   Procedure:  ESOPHAGOGASTRODUODENOSCOPY (EGD) WITH PROPOFOL;  Surgeon: Midge Minium, MD;  Location: [facility];  Service: Endoscopy;  Laterality: N/A;   EYE SURGERY  2014   cataract Jan and Feb with implants   HAND / FINGER TENDON LESION EXCISION Left 2000   I & D EXTREMITY Left 06/18/2012   Procedure: Irrigation and Debridement with wound exploration Left Hand and percutaneous pinning of left Fifth finger and third metacarpal;  Surgeon: Marlowe Shores, MD;  Location: [facility] OR;  Service: Orthopedics;  Laterality: Left;   KNEE SURGERY Left 1972   POLYPECTOMY N/A 12/31/2018   Procedure: POLYPECTOMY;  Surgeon: Midge Minium, MD;  Location: [facility];  Service: Endoscopy;  Laterality: N/A;    Medications:  Current Outpatient Medications on File Prior to Visit  Medication Sig   aspirin 81 MG tablet Take 81 mg by mouth daily.   dutasteride (AVODART) 0.5 MG capsule Take 0.5 mg by mouth every other day.   latanoprost (XALATAN) 0.005 % ophthalmic solution Place 1 drop into both eyes at bedtime.   Omega-3 Fatty Acids (FISH OIL) 1000 MG CAPS Take 1,000 mg by mouth 2 (two) times daily.   prednisoLONE acetate (PRED FORTE) 1 % ophthalmic suspension 1 drop 4 (four) times daily.   sildenafil (VIAGRA) 100 MG tablet Take 100 mg by mouth daily as needed for erectile dysfunction.   TURMERIC CURCUMIN PO Take 500 mg by mouth daily.   UNABLE TO FIND Take 20 mg by mouth daily. Eye Shield Plus Lutein   No current facility-administered medications on file prior to visit.    Allergies:  Allergies  Allergen Reactions   Penicillins Hives   Shrimp [Shellfish Allergy] Hives    Social History:  Social History   Socioeconomic History   Marital status: Married    Spouse name: Not on file   Number of children: Not on file   Years of education: Not on file   Highest education level: Not on file  Occupational History   Occupation: retired  Tobacco Use   Smoking status: Never   Smokeless tobacco: Never   Vaping Use   Vaping Use: Never used  Substance and Sexual Activity   Alcohol use: No   Drug use: No   Sexual activity: Yes  Other Topics Concern   Not on file  Social History Narrative   Not on file   Social Determinants of Health   Financial Resource Strain: Low Risk  (04/12/2021)   Overall Financial Resource Strain (CARDIA)    Difficulty of Paying Living Expenses: Not hard at all  Food Insecurity: No Food Insecurity (04/12/2021)   Hunger Vital Sign    Worried About Running Out of Food in the Last Year: Never true    Ran Out of Food in the Last Year: Never true  Transportation Needs: No Transportation Needs (04/12/2021)   PRAPARE -  Administrator, Civil Service (Medical): No    Lack of Transportation (Non-Medical): No  Physical Activity: Sufficiently Active (04/12/2021)   Exercise Vital Sign    Days of Exercise per Week: 5 days    Minutes of Exercise per Session: 60 min  Stress: No Stress Concern Present (04/12/2021)   [employer] of Occupational Health - Occupational Stress Questionnaire    Feeling of Stress : Not at all  Social Connections: Socially Integrated (04/12/2021)   Social Connection and Isolation Panel [NHANES]    Frequency of Communication with Friends and Family: More than three times a week    Frequency of Social Gatherings with Friends and Family: More than three times a week    Attends Religious Services: More than 4 times per year    Active Member of [employer] or Organizations: Not on file    Attends Banker Meetings: More than 4 times per year    Marital Status: Married  Catering manager Violence: Not At Risk (04/12/2021)   Humiliation, Afraid, Rape, and Kick questionnaire    Fear of Current or Ex-Partner: No    Emotionally Abused: No    Physically Abused: No    Sexually Abused: No   Social History   Tobacco Use  Smoking Status Never  Smokeless Tobacco Never   Social History   Substance and Sexual Activity  Alcohol Use No     Family History:  Family History  Problem Relation Age of Onset   Hypertension Mother    Diabetes Mother    Thyroid disease Mother    Glaucoma Father    Cancer Sister        colon and breast   Alzheimer's disease Sister    Diabetes Brother    Heart disease Brother    Seizures Sister    Dementia Sister     Past medical history, surgical history, medications, allergies, family history and social history reviewed with patient today and changes made to appropriate areas of the chart.   Review of Systems  Constitutional: Negative.   HENT: Negative.    Eyes: Negative.   Respiratory: Negative.    Cardiovascular: Negative.   Gastrointestinal: Negative.   Genitourinary: Negative.   Musculoskeletal:  Positive for joint pain (L knee pain). Negative for back pain, falls, myalgias and neck pain.  Skin: Negative.   Neurological: Negative.   Endo/Heme/Allergies:  Negative for environmental allergies and polydipsia. Bruises/bleeds easily.  Psychiatric/Behavioral: Negative.     All other ROS negative except what is listed above and in the HPI.      Objective:    BP 115/75   Pulse 66   Temp 97.7 F (36.5 C) (Oral)   Ht 6' 0.5" (1.842 m)   Wt 205 lb (93 kg)   SpO2 97%   BMI 27.42 kg/m   Wt Readings from Last 3 Encounters:  08/16/22 205 lb (93 kg)  02/14/22 201 lb 8 oz (91.4 kg)  08/11/21 206 lb 3.2 oz (93.5 kg)     Physical Exam Vitals and nursing note reviewed.  Constitutional:      General: He is not in acute distress.    Appearance: Normal appearance. He is normal weight. He is not ill-appearing, toxic-appearing or diaphoretic.  HENT:     Head: Normocephalic and atraumatic.     Right Ear: Tympanic membrane, ear canal and external ear normal. There is no impacted cerumen.     Left Ear: Tympanic membrane, ear canal and external ear normal. There  is no impacted cerumen.     Nose: Nose normal. No congestion or rhinorrhea.     Mouth/Throat:     Mouth: Mucous membranes  are moist.     Pharynx: Oropharynx is clear. No oropharyngeal exudate or posterior oropharyngeal erythema.  Eyes:     General: No scleral icterus.       Right eye: No discharge.        Left eye: No discharge.     Extraocular Movements: Extraocular movements intact.     Conjunctiva/sclera: Conjunctivae normal.     Pupils: Pupils are equal, round, and reactive to light.  Neck:     Vascular: No carotid bruit.  Cardiovascular:     Rate and Rhythm: Normal rate and regular rhythm.     Pulses: Normal pulses.     Heart sounds: No murmur heard.    No friction rub. No gallop.  Pulmonary:     Effort: Pulmonary effort is normal. No respiratory distress.     Breath sounds: Normal breath sounds. No stridor. No wheezing, rhonchi or rales.  Chest:     Chest wall: No tenderness.  Abdominal:     General: Abdomen is flat. Bowel sounds are normal. There is no distension.     Palpations: Abdomen is soft. There is no mass.     Tenderness: There is no abdominal tenderness. There is no right CVA tenderness, left CVA tenderness, guarding or rebound.     Hernia: No hernia is present.  Genitourinary:    Comments: Genital exam deferred with shared decision making Musculoskeletal:        General: No swelling, tenderness, deformity or signs of injury.     Cervical back: Normal range of motion and neck supple. No rigidity. No muscular tenderness.     Right lower leg: No edema.     Left lower leg: No edema.  Lymphadenopathy:     Cervical: No cervical adenopathy.  Skin:    General: Skin is warm and dry.     Capillary Refill: Capillary refill takes less than 2 seconds.     Coloration: Skin is not jaundiced or pale.     Findings: No bruising, erythema, lesion or rash.  Neurological:     General: No focal deficit present.     Mental Status: He is alert and oriented to person, place, and time.     Cranial Nerves: No cranial nerve deficit.     Sensory: No sensory deficit.     Motor: No weakness.      Coordination: Coordination normal.     Gait: Gait normal.     Deep Tendon Reflexes: Reflexes normal.  Psychiatric:        Mood and Affect: Mood normal.        Behavior: Behavior normal.        Thought Content: Thought content normal.        Judgment: Judgment normal.        08/16/2022    8:41 AM 04/06/2020    3:22 PM 04/01/2019   10:33 AM  6CIT Screen  What Year? 0 points 0 points 0 points  What month? 0 points 0 points 0 points  What time? 0 points 0 points 0 points  Count back from 20 0 points 0 points 2 points  Months in reverse 0 points 0 points 0 points  Repeat phrase 8 points 0 points 0 points  Total Score 8 points 0 points 2 points     Results for orders placed or  performed in visit on 02/14/22  CBC with Differential/Platelet  Result Value Ref Range   WBC 4.7 3.4 - 10.8 x10E3/uL   RBC 4.93 4.14 - 5.80 x10E6/uL   Hemoglobin 15.0 13.0 - 17.7 g/dL   Hematocrit 81.1 91.4 - 51.0 %   MCV 91 79 - 97 fL   MCH 30.4 26.6 - 33.0 pg   MCHC 33.6 31.5 - 35.7 g/dL   RDW 78.2 95.6 - 21.3 %   Platelets 198 150 - 450 x10E3/uL   Neutrophils 50 Not Estab. %   Lymphs 35 Not Estab. %   Monocytes 8 Not Estab. %   Eos 6 Not Estab. %   Basos 1 Not Estab. %   Neutrophils Absolute 2.3 1.4 - 7.0 x10E3/uL   Lymphocytes Absolute 1.7 0.7 - 3.1 x10E3/uL   Monocytes Absolute 0.4 0.1 - 0.9 x10E3/uL   EOS (ABSOLUTE) 0.3 0.0 - 0.4 x10E3/uL   Basophils Absolute 0.0 0.0 - 0.2 x10E3/uL   Immature Granulocytes 0 Not Estab. %   Immature Grans (Abs) 0.0 0.0 - 0.1 x10E3/uL  Basic metabolic panel  Result Value Ref Range   Glucose 99 70 - 99 mg/dL   BUN 12 8 - 27 mg/dL   Creatinine, Ser 0.86 0.76 - 1.27 mg/dL   eGFR 81 >57 QI/ONG/2.95   BUN/Creatinine Ratio 12 10 - 24   Sodium 140 134 - 144 mmol/L   Potassium 4.3 3.5 - 5.2 mmol/L   Chloride 102 96 - 106 mmol/L   CO2 25 20 - 29 mmol/L   Calcium 9.6 8.6 - 10.2 mg/dL      Assessment & Plan:   Problem List Items Addressed This Visit        Cardiovascular and Mediastinum   Essential hypertension    Under good control on current regimen. Continue current regimen. Continue to monitor. Call with any concerns. Refills given. Labs drawn today.       Relevant Medications   amLODipine (NORVASC) 5 MG tablet   benazepril (LOTENSIN) 40 MG tablet   Other Relevant Orders   Comprehensive metabolic panel   TSH   Urinalysis, Routine w reflex microscopic   Microalbumin, Urine Waived   Senile purpura (HCC)    Reassured patient. Continue to monitor.       Relevant Medications   amLODipine (NORVASC) 5 MG tablet   benazepril (LOTENSIN) 40 MG tablet   Other Relevant Orders   CBC with Differential/Platelet     Genitourinary   BPH with obstruction/lower urinary tract symptoms    Would like to get back into see urology. Referral generated today.      Relevant Orders   Urinalysis, Routine w reflex microscopic   Ambulatory referral to Urology   Other Visit Diagnoses     Encounter for Medicare annual wellness exam    -  Primary   Preventative care discussed today as below.   Routine general medical examination at a health care facility       Vaccines up to date. Screening labs checked today. Colonoscopy up to date. Continue diet and exercise. Call with any concerns.   Hyperglycemia       Will check A1c. Await results.   Relevant Orders   Bayer DCA Hb A1c Waived   Screening for cholesterol level       Checking labs today. Await results. Treat as needed.   Relevant Orders   Lipid Panel w/o Chol/HDL Ratio        Preventative Services:  Health Risk Assessment  and Personalized Prevention Plan: Done today Bone Mass Measurements: N/A CVD Screening: Done today Colon Cancer Screening: Up to date Depression Screening: Done today Diabetes Screening: Done today Glaucoma Screening: See your eye doctor Hepatitis B vaccine: N/A Hepatitis C screening: up to date HIV Screening: up to date Flu Vaccine: get in the fall Lung cancer  Screening: N/A Obesity Screening: Done today Pneumonia Vaccines (2): up to date STI Screening: N/A PSA screening: gets through urology  Discussed aspirin prophylaxis for myocardial infarction prevention and decision was made to continue ASA  LABORATORY TESTING:  Health maintenance labs ordered today as discussed above.   IMMUNIZATIONS:   - Tdap: Tetanus vaccination status reviewed: last tetanus booster within 10 years. - Influenza: Up to date - Pneumovax: Up to date - Prevnar: Up to date - Zostavax vaccine: Up to date  SCREENING: - Colonoscopy: Up to date  Discussed with patient purpose of the colonoscopy is to detect colon cancer at curable precancerous or early stages   PATIENT COUNSELING:    Sexuality: Discussed sexually transmitted diseases, partner selection, use of condoms, avoidance of unintended pregnancy  and contraceptive alternatives.   Advised to avoid cigarette smoking.  I discussed with the patient that most people either abstain from alcohol or drink within safe limits (<=14/week and <=4 drinks/occasion for males, <=7/weeks and <= 3 drinks/occasion for females) and that the risk for alcohol disorders and other health effects rises proportionally with the number of drinks per week and how often a drinker exceeds daily limits.  Discussed cessation/primary prevention of drug use and availability of treatment for abuse.   Diet: Encouraged to adjust caloric intake to maintain  or achieve ideal body weight, to reduce intake of dietary saturated fat and total fat, to limit sodium intake by avoiding high sodium foods and not adding table salt, and to maintain adequate dietary potassium and calcium preferably from fresh fruits, vegetables, and low-fat dairy products.    stressed the importance of regular exercise  Injury prevention: Discussed safety belts, safety helmets, smoke detector, smoking near bedding or upholstery.   Dental health: Discussed importance of regular  tooth brushing, flossing, and dental visits.   Follow up plan: NEXT PREVENTATIVE PHYSICAL DUE IN 1 YEAR. Return in about 6 months (around 02/16/2023).

## 2022-08-16 NOTE — Assessment & Plan Note (Signed)
Under good control on current regimen. Continue current regimen. Continue to monitor. Call with any concerns. Refills given. Labs drawn today.   

## 2022-08-16 NOTE — Assessment & Plan Note (Signed)
Would like to get back into see urology. Referral generated today.

## 2022-08-16 NOTE — Progress Notes (Deleted)
BP 115/75   Pulse 66   Temp 97.7 F (36.5 C) (Oral)   Ht 6' 0.5" (1.842 m)   Wt 205 lb (93 kg)   SpO2 97%   BMI 27.42 kg/m    Subjective:    Patient ID: Christopher Henderson, male    DOB: 03-17-1946, 77 y.o.   MRN: [id number]  HPI: Christopher Henderson is a 77 y.o. male  Chief Complaint  Patient presents with   Annual Exam    Relevant past medical, surgical, family and social history reviewed and updated as indicated. Interim medical history since our last visit reviewed. Allergies and medications reviewed and updated.  Review of Systems  Per HPI unless specifically indicated above     Objective:    BP 115/75   Pulse 66   Temp 97.7 F (36.5 C) (Oral)   Ht 6' 0.5" (1.842 m)   Wt 205 lb (93 kg)   SpO2 97%   BMI 27.42 kg/m   Wt Readings from Last 3 Encounters:  08/16/22 205 lb (93 kg)  02/14/22 201 lb 8 oz (91.4 kg)  08/11/21 206 lb 3.2 oz (93.5 kg)    Physical Exam  Results for orders placed or performed in visit on 02/14/22  CBC with Differential/Platelet  Result Value Ref Range   WBC 4.7 3.4 - 10.8 x10E3/uL   RBC 4.93 4.14 - 5.80 x10E6/uL   Hemoglobin 15.0 13.0 - 17.7 g/dL   Hematocrit 40.9 81.1 - 51.0 %   MCV 91 79 - 97 fL   MCH 30.4 26.6 - 33.0 pg   MCHC 33.6 31.5 - 35.7 g/dL   RDW 91.4 78.2 - 95.6 %   Platelets 198 150 - 450 x10E3/uL   Neutrophils 50 Not Estab. %   Lymphs 35 Not Estab. %   Monocytes 8 Not Estab. %   Eos 6 Not Estab. %   Basos 1 Not Estab. %   Neutrophils Absolute 2.3 1.4 - 7.0 x10E3/uL   Lymphocytes Absolute 1.7 0.7 - 3.1 x10E3/uL   Monocytes Absolute 0.4 0.1 - 0.9 x10E3/uL   EOS (ABSOLUTE) 0.3 0.0 - 0.4 x10E3/uL   Basophils Absolute 0.0 0.0 - 0.2 x10E3/uL   Immature Granulocytes 0 Not Estab. %   Immature Grans (Abs) 0.0 0.0 - 0.1 x10E3/uL  Basic metabolic panel  Result Value Ref Range   Glucose 99 70 - 99 mg/dL   BUN 12 8 - 27 mg/dL   Creatinine, Ser 2.13 0.76 - 1.27 mg/dL   eGFR 81 >08 MV/HQI/6.96   BUN/Creatinine Ratio 12 10  - 24   Sodium 140 134 - 144 mmol/L   Potassium 4.3 3.5 - 5.2 mmol/L   Chloride 102 96 - 106 mmol/L   CO2 25 20 - 29 mmol/L   Calcium 9.6 8.6 - 10.2 mg/dL      Assessment & Plan:   Problem List Items Addressed This Visit       Cardiovascular and Mediastinum   Essential hypertension   Relevant Orders   Comprehensive metabolic panel   TSH   Urinalysis, Routine w reflex microscopic   Microalbumin, Urine Waived   Senile purpura (HCC)   Relevant Orders   CBC with Differential/Platelet     Genitourinary   BPH with obstruction/lower urinary tract symptoms   Relevant Orders   Urinalysis, Routine w reflex microscopic   Ambulatory referral to Urology   Other Visit Diagnoses     Hyperglycemia    -  Primary   Relevant Orders  Bayer DCA Hb A1c Waived   Screening for cholesterol level       Relevant Orders   Lipid Panel w/o Chol/HDL Ratio        Follow up plan: No follow-ups on file.

## 2022-08-16 NOTE — Assessment & Plan Note (Signed)
Reassured patient. Continue to monitor.  

## 2022-08-17 LAB — CBC WITH DIFFERENTIAL/PLATELET
Basophils Absolute: 0 10*3/uL (ref 0.0–0.2)
Basos: 1 %
EOS (ABSOLUTE): 0.3 10*3/uL (ref 0.0–0.4)
Eos: 7 %
Hematocrit: 44.8 % (ref 37.5–51.0)
Hemoglobin: 14.3 g/dL (ref 13.0–17.7)
Immature Grans (Abs): 0 10*3/uL (ref 0.0–0.1)
Immature Granulocytes: 0 %
Lymphocytes Absolute: 1.7 10*3/uL (ref 0.7–3.1)
Lymphs: 33 %
MCH: 29.7 pg (ref 26.6–33.0)
MCHC: 31.9 g/dL (ref 31.5–35.7)
MCV: 93 fL (ref 79–97)
Monocytes Absolute: 0.4 10*3/uL (ref 0.1–0.9)
Monocytes: 8 %
Neutrophils Absolute: 2.6 10*3/uL (ref 1.4–7.0)
Neutrophils: 51 %
Platelets: 232 10*3/uL (ref 150–450)
RBC: 4.82 x10E6/uL (ref 4.14–5.80)
RDW: 12.3 % (ref 11.6–15.4)
WBC: 5.1 10*3/uL (ref 3.4–10.8)

## 2022-08-17 LAB — COMPREHENSIVE METABOLIC PANEL
ALT: 17 IU/L (ref 0–44)
AST: 24 IU/L (ref 0–40)
Albumin/Globulin Ratio: 1.7 (ref 1.2–2.2)
Albumin: 4.5 g/dL (ref 3.8–4.8)
Alkaline Phosphatase: 66 IU/L (ref 44–121)
BUN/Creatinine Ratio: 13 (ref 10–24)
BUN: 13 mg/dL (ref 8–27)
Bilirubin Total: 0.6 mg/dL (ref 0.0–1.2)
CO2: 21 mmol/L (ref 20–29)
Calcium: 9.6 mg/dL (ref 8.6–10.2)
Chloride: 102 mmol/L (ref 96–106)
Creatinine, Ser: 1.01 mg/dL (ref 0.76–1.27)
Globulin, Total: 2.6 g/dL (ref 1.5–4.5)
Glucose: 105 mg/dL — ABNORMAL HIGH (ref 70–99)
Potassium: 4.8 mmol/L (ref 3.5–5.2)
Sodium: 137 mmol/L (ref 134–144)
Total Protein: 7.1 g/dL (ref 6.0–8.5)
eGFR: 78 mL/min/{1.73_m2} (ref >59)

## 2022-08-17 LAB — LIPID PANEL W/O CHOL/HDL RATIO
Cholesterol, Total: 193 mg/dL (ref 100–199)
HDL: 42 mg/dL (ref >39)
LDL Chol Calc (NIH): 135 mg/dL — ABNORMAL HIGH (ref 0–99)
Triglycerides: 90 mg/dL (ref 0–149)
VLDL Cholesterol Cal: 16 mg/dL (ref 5–40)

## 2022-08-17 LAB — TSH: TSH: 2.02 u[IU]/mL (ref 0.450–4.500)

## 2022-11-22 ENCOUNTER — Ambulatory Visit: Payer: Medicare HMO | Admitting: Dermatology

## 2022-11-22 ENCOUNTER — Encounter: Payer: Self-pay | Admitting: Dermatology

## 2022-11-22 DIAGNOSIS — W908XXA Exposure to other nonionizing radiation, initial encounter: Secondary | ICD-10-CM

## 2022-11-22 DIAGNOSIS — L570 Actinic keratosis: Secondary | ICD-10-CM | POA: Diagnosis not present

## 2022-11-22 DIAGNOSIS — L578 Other skin changes due to chronic exposure to nonionizing radiation: Secondary | ICD-10-CM

## 2022-11-22 NOTE — Patient Instructions (Signed)
 Cryotherapy Aftercare  Wash gently with soap and water everyday.   Apply Vaseline and Band-Aid daily until healed.   Due to recent changes in healthcare laws, you may see results of your pathology and/or laboratory studies on MyChart before the doctors have had a chance to review them. We understand that in some cases there may be results that are confusing or concerning to you. Please understand that not all results are received at the same time and often the doctors may need to interpret multiple results in order to provide you with the best plan of care or course of treatment. Therefore, we ask that you please give [place] 2 business days to thoroughly review all your results before contacting the office for clarification. Should we see a critical lab result, you will be contacted sooner.   If You Need Anything After Your Visit  If you have any questions or concerns for your doctor, please call our main line at [phone] and press option 4 to reach your doctor's medical assistant. If no one answers, please leave a voicemail as directed and we will return your call as soon as possible. Messages left after 4 pm will be answered the following business day.   You may also send [place] a message via MyChart. We typically respond to MyChart messages within 1-2 business days.  For prescription refills, please ask your pharmacy to contact our office. Our fax number is [phone].  If you have an urgent issue when the clinic is closed that cannot wait until the next business day, you can page your doctor at the number below.    Please note that while we do our best to be available for urgent issues outside of office hours, we are not available 24/7.   If you have an urgent issue and are unable to reach [place], you may choose to seek medical care at your doctor's office, retail clinic, urgent care center, or emergency room.  If you have a medical emergency, please immediately call 911 or go to the emergency  department.  Pager Numbers  - Dr. Gwen Pounds: [phone]  - Dr. Roseanne Reno: [phone]  - Dr. Katrinka Blazing: [phone]   In the event of inclement weather, please call our main line at [phone] for an update on the status of any delays or closures.  Dermatology Medication Tips: Please keep the boxes that topical medications come in in order to help keep track of the instructions about where and how to use these. Pharmacies typically print the medication instructions only on the boxes and not directly on the medication tubes.   If your medication is too expensive, please contact our office at [phone] option 4 or send [place] a message through MyChart.   We are unable to tell what your co-pay for medications will be in advance as this is different depending on your insurance coverage. However, we may be able to find a substitute medication at lower cost or fill out paperwork to get insurance to cover a needed medication.   If a prior authorization is required to get your medication covered by your insurance company, please allow [place] 1-2 business days to complete this process.  Drug prices often vary depending on where the prescription is filled and some pharmacies may offer cheaper prices.  The website www.goodrx.com contains coupons for medications through different pharmacies. The prices here do not account for what the cost may be with help from insurance (it may be cheaper with your insurance), but the website can give  you the price if you did not use any insurance.  - You can print the associated coupon and take it with your prescription to the pharmacy.  - You may also stop by our office during regular business hours and pick up a GoodRx coupon card.  - If you need your prescription sent electronically to a different pharmacy, notify our office through [facility] or by phone at [phone] option 4.

## 2022-11-22 NOTE — Progress Notes (Signed)
   Follow-Up Visit   Subjective  Christopher Henderson is a 77 y.o. male who presents for the following: scaly spots at face. Patient has had AK's treated by Dr. Gwen Pounds in the past.  The patient has spots, moles and lesions to be evaluated, some may be new or changing and the patient may have concern these could be cancer.   The following portions of the chart were reviewed this encounter and updated as appropriate: medications, allergies, medical history  Review of Systems:  No other skin or systemic complaints except as noted in HPI or Assessment and Plan.  Objective  Well appearing patient in no apparent distress; mood and affect are within normal limits.   A focused examination was performed of the following areas: face  Relevant exam findings are noted in the Assessment and Plan.  R temple x 3, R antihelix x 1, L temple x 1 (5) Erythematous thin papules/macules with gritty scale.     Assessment & Plan     AK (actinic keratosis) (5) R temple x 3, R antihelix x 1, L temple x 1  Actinic keratoses are precancerous spots that appear secondary to cumulative UV radiation exposure/sun exposure over time. They are chronic with expected duration over 1 year. A portion of actinic keratoses will progress to squamous cell carcinoma of the skin. It is not possible to reliably predict which spots will progress to skin cancer and so treatment is recommended to prevent development of skin cancer.  Recommend daily broad spectrum sunscreen SPF 30+ to sun-exposed areas, reapply every 2 hours as needed.  Recommend staying in the shade or wearing long sleeves, sun glasses (UVA+UVB protection) and wide brim hats (4-inch brim around the entire circumference of the hat). Call for new or changing lesions.   Destruction of lesion - R temple x 3, R antihelix x 1, L temple x 1 (5)  Destruction method: cryotherapy   Informed consent: discussed and consent obtained   Lesion destroyed using liquid  nitrogen: Yes   Cryotherapy cycles:  2 Outcome: patient tolerated procedure well with no complications   Post-procedure details: wound care instructions given    ACTINIC DAMAGE - chronic, secondary to cumulative UV radiation exposure/sun exposure over time - diffuse scaly erythematous macules with underlying dyspigmentation - Recommend daily broad spectrum sunscreen SPF 30+ to sun-exposed areas, reapply every 2 hours as needed.  - Recommend staying in the shade or wearing long sleeves, sun glasses (UVA+UVB protection) and wide brim hats (4-inch brim around the entire circumference of the hat). - Call for new or changing lesions.   Return for as scheduled.  Anise Salvo, RMA, am acting as scribe for Elie Goody, MD .   Documentation: I have reviewed the above documentation for accuracy and completeness, and I agree with the above.  Elie Goody, MD

## 2023-02-16 ENCOUNTER — Ambulatory Visit (INDEPENDENT_AMBULATORY_CARE_PROVIDER_SITE_OTHER): Payer: Medicare HMO | Admitting: Family Medicine

## 2023-02-16 ENCOUNTER — Encounter: Payer: Self-pay | Admitting: Family Medicine

## 2023-02-16 VITALS — BP 139/79 | HR 53 | Temp 98.3°F | Ht 72.0 in | Wt 195.4 lb

## 2023-02-16 DIAGNOSIS — H40013 Open angle with borderline findings, low risk, bilateral: Secondary | ICD-10-CM | POA: Diagnosis not present

## 2023-02-16 DIAGNOSIS — I10 Essential (primary) hypertension: Secondary | ICD-10-CM

## 2023-02-16 DIAGNOSIS — N401 Enlarged prostate with lower urinary tract symptoms: Secondary | ICD-10-CM | POA: Diagnosis not present

## 2023-02-16 DIAGNOSIS — N138 Other obstructive and reflux uropathy: Secondary | ICD-10-CM | POA: Diagnosis not present

## 2023-02-16 DIAGNOSIS — H26493 Other secondary cataract, bilateral: Secondary | ICD-10-CM | POA: Diagnosis not present

## 2023-02-16 MED ORDER — BENAZEPRIL HCL 40 MG PO TABS: 40.0000 mg | ORAL_TABLET | Freq: Every day | ORAL | 1 refills | Status: DC

## 2023-02-16 MED ORDER — AMLODIPINE BESYLATE 5 MG PO TABS: 5.0000 mg | ORAL_TABLET | Freq: Every day | ORAL | 1 refills | Status: DC

## 2023-02-16 MED ORDER — PANTOPRAZOLE SODIUM 40 MG PO TBEC: 40.0000 mg | DELAYED_RELEASE_TABLET | Freq: Every day | ORAL | 1 refills | Status: DC

## 2023-02-16 NOTE — Assessment & Plan Note (Signed)
Was following with urology, but his urologist retired. Feeling well. Rechecking PSA today.

## 2023-02-16 NOTE — Assessment & Plan Note (Signed)
Under good control on current regimen. Continue current regimen. Continue to monitor. Call with any concerns. Refills given. Labs drawn today.   

## 2023-02-16 NOTE — Progress Notes (Signed)
Pt has an appointment already set up on 08/17/2023 @ 8:00 am.

## 2023-02-16 NOTE — Progress Notes (Signed)
BP 139/79 (BP Location: Right Arm, Patient Position: Sitting, Cuff Size: Normal)   Pulse (!) 53   Temp 98.3 F (36.8 C) (Oral)   Ht 6' (1.829 m)   Wt 195 lb 6.4 oz (88.6 kg)   SpO2 96%   BMI 26.50 kg/m    Subjective:    Patient ID: Christopher Henderson, male    DOB: 11-25-1945, 77 y.o.   MRN: [id number]  HPI: Christopher Henderson is a 77 y.o. male  Chief Complaint  Patient presents with   6 month follow up   Blood Pressure Check   Medication Refill    Amlodepine and benazepril    HYPERTENSION  Hypertension status: controlled  Satisfied with current treatment? yes Duration of hypertension: chronic BP monitoring frequency:  not checking BP medication side effects:  no Medication compliance: excellent compliance Previous BP meds: benazepril, amlodipine Aspirin: yes Recurrent headaches: no Visual changes: no Palpitations: no Dyspnea: no Chest pain: no Lower extremity edema: no Dizzy/lightheaded: no  BPH BPH status: controlled Satisfied with current treatment?: yes Duration: chronic Nocturia: 1-2x per night Urinary frequency:no Incomplete voiding: no Urgency: no Weak urinary stream: no Straining to start stream: no Dysuria: no Onset: gradual Severity: mild   Relevant past medical, surgical, family and social history reviewed and updated as indicated. Interim medical history since our last visit reviewed. Allergies and medications reviewed and updated.  Review of Systems  Constitutional: Negative.   Respiratory: Negative.    Cardiovascular: Negative.   Musculoskeletal: Negative.   Psychiatric/Behavioral: Negative.      Per HPI unless specifically indicated above     Objective:    BP 139/79 (BP Location: Right Arm, Patient Position: Sitting, Cuff Size: Normal)   Pulse (!) 53   Temp 98.3 F (36.8 C) (Oral)   Ht 6' (1.829 m)   Wt 195 lb 6.4 oz (88.6 kg)   SpO2 96%   BMI 26.50 kg/m   Wt Readings from Last 3 Encounters:  02/16/23 195 lb 6.4 oz (88.6 kg)   08/16/22 205 lb (93 kg)  02/14/22 201 lb 8 oz (91.4 kg)    Physical Exam Vitals and nursing note reviewed.  Constitutional:      General: He is not in acute distress.    Appearance: Normal appearance. He is not ill-appearing, toxic-appearing or diaphoretic.  HENT:     Head: Normocephalic and atraumatic.     Right Ear: External ear normal.     Left Ear: External ear normal.     Nose: Nose normal.     Mouth/Throat:     Mouth: Mucous membranes are moist.     Pharynx: Oropharynx is clear.  Eyes:     General: No scleral icterus.       Right eye: No discharge.        Left eye: No discharge.     Extraocular Movements: Extraocular movements intact.     Conjunctiva/sclera: Conjunctivae normal.     Pupils: Pupils are equal, round, and reactive to light.  Cardiovascular:     Rate and Rhythm: Normal rate and regular rhythm.     Pulses: Normal pulses.     Heart sounds: Normal heart sounds. No murmur heard.    No friction rub. No gallop.  Pulmonary:     Effort: Pulmonary effort is normal. No respiratory distress.     Breath sounds: Normal breath sounds. No stridor. No wheezing, rhonchi or rales.  Chest:     Chest wall: No tenderness.  Musculoskeletal:  General: Normal range of motion.     Cervical back: Normal range of motion and neck supple.  Skin:    General: Skin is warm and dry.     Capillary Refill: Capillary refill takes less than 2 seconds.     Coloration: Skin is not jaundiced or pale.     Findings: No bruising, erythema, lesion or rash.  Neurological:     General: No focal deficit present.     Mental Status: He is alert and oriented to person, place, and time. Mental status is at baseline.  Psychiatric:        Mood and Affect: Mood normal.        Behavior: Behavior normal.        Thought Content: Thought content normal.        Judgment: Judgment normal.     Results for orders placed or performed in visit on 08/16/22  Bayer DCA Hb A1c Waived  Result Value Ref  Range   HB A1C (BAYER DCA - WAIVED) 6.4 (H) 4.8 - 5.6 %  Comprehensive metabolic panel  Result Value Ref Range   Glucose 105 (H) 70 - 99 mg/dL   BUN 13 8 - 27 mg/dL   Creatinine, Ser 1.61 0.76 - 1.27 mg/dL   eGFR 78 >09 UE/AVW/0.98   BUN/Creatinine Ratio 13 10 - 24   Sodium 137 134 - 144 mmol/L   Potassium 4.8 3.5 - 5.2 mmol/L   Chloride 102 96 - 106 mmol/L   CO2 21 20 - 29 mmol/L   Calcium 9.6 8.6 - 10.2 mg/dL   Total Protein 7.1 6.0 - 8.5 g/dL   Albumin 4.5 3.8 - 4.8 g/dL   Globulin, Total 2.6 1.5 - 4.5 g/dL   Albumin/Globulin Ratio 1.7 1.2 - 2.2   Bilirubin Total 0.6 0.0 - 1.2 mg/dL   Alkaline Phosphatase 66 44 - 121 IU/L   AST 24 0 - 40 IU/L   ALT 17 0 - 44 IU/L  CBC with Differential/Platelet  Result Value Ref Range   WBC 5.1 3.4 - 10.8 x10E3/uL   RBC 4.82 4.14 - 5.80 x10E6/uL   Hemoglobin 14.3 13.0 - 17.7 g/dL   Hematocrit 11.9 14.7 - 51.0 %   MCV 93 79 - 97 fL   MCH 29.7 26.6 - 33.0 pg   MCHC 31.9 31.5 - 35.7 g/dL   RDW 82.9 56.2 - 13.0 %   Platelets 232 150 - 450 x10E3/uL   Neutrophils 51 Not Estab. %   Lymphs 33 Not Estab. %   Monocytes 8 Not Estab. %   Eos 7 Not Estab. %   Basos 1 Not Estab. %   Neutrophils Absolute 2.6 1.4 - 7.0 x10E3/uL   Lymphocytes Absolute 1.7 0.7 - 3.1 x10E3/uL   Monocytes Absolute 0.4 0.1 - 0.9 x10E3/uL   EOS (ABSOLUTE) 0.3 0.0 - 0.4 x10E3/uL   Basophils Absolute 0.0 0.0 - 0.2 x10E3/uL   Immature Granulocytes 0 Not Estab. %   Immature Grans (Abs) 0.0 0.0 - 0.1 x10E3/uL  Lipid Panel w/o Chol/HDL Ratio  Result Value Ref Range   Cholesterol, Total 193 100 - 199 mg/dL   Triglycerides 90 0 - 149 mg/dL   HDL 42 >86 mg/dL   VLDL Cholesterol Cal 16 5 - 40 mg/dL   LDL Chol Calc (NIH) 578 (H) 0 - 99 mg/dL  TSH  Result Value Ref Range   TSH 2.020 0.450 - 4.500 uIU/mL  Urinalysis, Routine w reflex microscopic  Result Value Ref Range  Specific Gravity, UA 1.025 1.005 - 1.030   pH, UA 5.0 5.0 - 7.5   Color, UA Yellow Yellow   Appearance  Ur Clear Clear   Leukocytes,UA Negative Negative   Protein,UA Trace (A) Negative/Trace   Glucose, UA Negative Negative   Ketones, UA 1+ (A) Negative   RBC, UA Negative Negative   Bilirubin, UA Negative Negative   Urobilinogen, Ur 1.0 0.2 - 1.0 mg/dL   Nitrite, UA Negative Negative   Microscopic Examination Comment   Microalbumin, Urine Waived  Result Value Ref Range   Microalb, Ur Waived 30 (H) 0 - 19 mg/L   Creatinine, Urine Waived 200 10 - 300 mg/dL   Microalb/Creat Ratio <30 <30 mg/g      Assessment & Plan:   Problem List Items Addressed This Visit       Cardiovascular and Mediastinum   Essential hypertension - Primary    Under good control on current regimen. Continue current regimen. Continue to monitor. Call with any concerns. Refills given. Labs drawn today.        Relevant Medications   amLODipine (NORVASC) 5 MG tablet   benazepril (LOTENSIN) 40 MG tablet   Other Relevant Orders   Basic metabolic panel     Genitourinary   BPH with obstruction/lower urinary tract symptoms    Was following with urology, but his urologist retired. Feeling well. Rechecking PSA today.      Relevant Orders   PSA     Follow up plan: Return in about 6 months (around 08/16/2023) for physical.

## 2023-02-17 LAB — BASIC METABOLIC PANEL
BUN/Creatinine Ratio: 11 (ref 10–24)
BUN: 10 mg/dL (ref 8–27)
CO2: 24 mmol/L (ref 20–29)
Calcium: 9.8 mg/dL (ref 8.6–10.2)
Chloride: 102 mmol/L (ref 96–106)
Creatinine, Ser: 0.88 mg/dL (ref 0.76–1.27)
Glucose: 101 mg/dL — ABNORMAL HIGH (ref 70–99)
Potassium: 4.5 mmol/L (ref 3.5–5.2)
Sodium: 141 mmol/L (ref 134–144)
eGFR: 89 mL/min/{1.73_m2} (ref >59)

## 2023-02-17 LAB — PSA: Prostate Specific Ag, Serum: 1.5 ng/mL (ref 0.0–4.0)

## 2023-02-20 ENCOUNTER — Encounter: Payer: Self-pay | Admitting: Dermatology

## 2023-02-20 ENCOUNTER — Ambulatory Visit: Payer: Medicare HMO | Admitting: Dermatology

## 2023-02-20 DIAGNOSIS — W908XXA Exposure to other nonionizing radiation, initial encounter: Secondary | ICD-10-CM

## 2023-02-20 DIAGNOSIS — L821 Other seborrheic keratosis: Secondary | ICD-10-CM

## 2023-02-20 DIAGNOSIS — L570 Actinic keratosis: Secondary | ICD-10-CM | POA: Diagnosis not present

## 2023-02-20 NOTE — Patient Instructions (Addendum)
Cryotherapy Aftercare  Wash gently with soap and water everyday.   Apply Vaseline Jelly daily until healed.    Recommend daily broad spectrum sunscreen SPF 30+ to sun-exposed areas, reapply every 2 hours as needed. Call for new or changing lesions.  Staying in the shade or wearing long sleeves, sun glasses (UVA+UVB protection) and wide brim hats (4-inch brim around the entire circumference of the hat) are also recommended for sun protection.    Due to recent changes in healthcare laws, you may see results of your pathology and/or laboratory studies on MyChart before the doctors have had a chance to review them. We understand that in some cases there may be results that are confusing or concerning to you. Please understand that not all results are received at the same time and often the doctors may need to interpret multiple results in order to provide you with the best plan of care or course of treatment. Therefore, we ask that you please give [place] 2 business days to thoroughly review all your results before contacting the office for clarification. Should we see a critical lab result, you will be contacted sooner.   If You Need Anything After Your Visit  If you have any questions or concerns for your doctor, please call our main line at [phone] and press option 4 to reach your doctor's medical assistant. If no one answers, please leave a voicemail as directed and we will return your call as soon as possible. Messages left after 4 pm will be answered the following business day.   You may also send [place] a message via MyChart. We typically respond to MyChart messages within 1-2 business days.  For prescription refills, please ask your pharmacy to contact our office. Our fax number is [phone].  If you have an urgent issue when the clinic is closed that cannot wait until the next business day, you can page your doctor at the number below.    Please note that while we do our best to be  available for urgent issues outside of office hours, we are not available 24/7.   If you have an urgent issue and are unable to reach [place], you may choose to seek medical care at your doctor's office, retail clinic, urgent care center, or emergency room.  If you have a medical emergency, please immediately call 911 or go to the emergency department.  Pager Numbers  - Dr. Gwen Pounds: [phone]  - Dr. Roseanne Reno: [phone]  - Dr. Katrinka Blazing: [phone]   In the event of inclement weather, please call our main line at [phone] for an update on the status of any delays or closures.  Dermatology Medication Tips: Please keep the boxes that topical medications come in in order to help keep track of the instructions about where and how to use these. Pharmacies typically print the medication instructions only on the boxes and not directly on the medication tubes.   If your medication is too expensive, please contact our office at [phone] option 4 or send [place] a message through MyChart.   We are unable to tell what your co-pay for medications will be in advance as this is different depending on your insurance coverage. However, we may be able to find a substitute medication at lower cost or fill out paperwork to get insurance to cover a needed medication.   If a prior authorization is required to get your medication covered by your insurance company, please allow [place] 1-2 business days to complete this process.  Drug prices often vary depending on where the prescription is filled and some pharmacies may offer cheaper prices.  The website www.goodrx.com contains coupons for medications through different pharmacies. The prices here do not account for what the cost may be with help from insurance (it may be cheaper with your insurance), but the website can give you the price if you did not use any insurance.  - You can print the associated coupon and take it with your prescription to the pharmacy.   - You may also stop by our office during regular business hours and pick up a GoodRx coupon card.  - If you need your prescription sent electronically to a different pharmacy, notify our office through [facility] or by phone at [phone] option 4.     Si Usted Necesita Algo Despus de Su Visita  Tambin puede enviarnos un mensaje a travs de Clinical cytogeneticist. Por lo general respondemos a los mensajes de MyChart en el transcurso de 1 a 2 das hbiles.  Para renovar recetas, por favor pida a su farmacia que se ponga en contacto con nuestra oficina. Annie Sable de fax es [place] [phone].  Si tiene un asunto urgente cuando la clnica est cerrada y que no puede esperar hasta el siguiente da hbil, puede llamar/localizar a su doctor(a) al nmero que aparece a continuacin.   Por favor, tenga en cuenta que aunque hacemos todo lo posible para estar disponibles para asuntos urgentes fuera del horario de [place], no estamos disponibles las 24 horas del da, los 7 [address]  [address] de la [place].   Si tiene un problema urgente y no puede comunicarse con nosotros, puede optar por buscar atencin mdica  en el consultorio de su doctor(a), en una clnica privada, en un centro de atencin urgente o en una sala de emergencias.  Si tiene Engineer, drilling, por favor llame inmediatamente al 911 o vaya a la sala de emergencias.  Nmeros de bper  - Dr. Gwen Pounds: [phone]  - Dra. Roseanne Reno: [phone]  - Dr. Katrinka Blazing: [phone]   En caso de inclemencias del tiempo, por favor llame a Lacy Duverney principal al [phone] para una actualizacin sobre el [place] de cualquier retraso o cierre.  Consejos para la medicacin en dermatologa: Por favor, guarde las cajas en las que vienen los medicamentos de uso tpico para ayudarle a seguir las instrucciones sobre dnde y cmo usarlos. Las farmacias generalmente imprimen las instrucciones del medicamento slo en las cajas y no directamente en los tubos del  [place].   Si su medicamento es muy caro, por favor, pngase en contacto con Rolm Gala llamando al [phone] y presione la opcin 4 o envenos un mensaje a travs de Clinical cytogeneticist.   No podemos decirle cul ser su copago por los medicamentos por adelantado ya que esto es diferente dependiendo de la cobertura de su seguro. Sin embargo, es posible que podamos encontrar un medicamento sustituto a Audiological scientist un formulario para que el seguro cubra el medicamento que se considera necesario.   Si se requiere una autorizacin previa para que su compaa de seguros [place] su medicamento, por favor permtanos de 1 a 2 das hbiles para completar [address]  Los precios de los medicamentos varan con frecuencia dependiendo del Environmental consultant de dnde se surte la receta y alguna farmacias pueden ofrecer precios ms baratos.  El sitio web www.goodrx.com tiene cupones para medicamentos de Health and safety inspector. Los precios aqu no tienen en cuenta lo que podra costar con la ayuda del seguro (puede ser  ms barato con su seguro), pero el sitio web puede darle el precio si no Visual merchandiser.  - Puede imprimir el cupn correspondiente y llevarlo con su receta a la farmacia.  - Tambin puede pasar por nuestra oficina durante el horario de atencin regular y Education officer, museum una tarjeta de cupones de GoodRx.  - Si necesita que su receta se enve electrnicamente a una farmacia diferente, informe a nuestra oficina a travs de MyChart de [place] o por telfono llamando al [phone] y presione la opcin 4.

## 2023-02-20 NOTE — Progress Notes (Signed)
   Follow-Up Visit   Subjective  Christopher Henderson is a 76 y.o. male who presents for the following: Spot on end of nose. Recheck AKs on face that were treated with LN2 11/22/2022. Right temple, R antihelix, left temple.   The patient has spots, moles and lesions to be evaluated, some may be new or changing and the patient may have concern these could be cancer.    The following portions of the chart were reviewed this encounter and updated as appropriate: medications, allergies, medical history  Review of Systems:  No other skin or systemic complaints except as noted in HPI or Assessment and Plan.  Objective  Well appearing patient in no apparent distress; mood and affect are within normal limits.  A focused examination was performed of the following areas: Face, ears, hands  Relevant physical exam findings are noted in the Assessment and Plan.  right zygoma x1, left nasal tip x1, right lateral eyebrow x1, right temple x1, right antihelix x1 Erythematous thin papules/macules with gritty scale.     Assessment & Plan   AK (actinic keratosis) right zygoma x1, left nasal tip x1, right lateral eyebrow x1, right temple x1, right antihelix x1  Actinic keratoses are precancerous spots that appear secondary to cumulative UV radiation exposure/sun exposure over time. They are chronic with expected duration over 1 year. A portion of actinic keratoses will progress to squamous cell carcinoma of the skin. It is not possible to reliably predict which spots will progress to skin cancer and so treatment is recommended to prevent development of skin cancer.  Recommend daily broad spectrum sunscreen SPF 30+ to sun-exposed areas, reapply every 2 hours as needed.  Recommend staying in the shade or wearing long sleeves, sun glasses (UVA+UVB protection) and wide brim hats (4-inch brim around the entire circumference of the hat). Call for new or changing lesions.  Destruction of lesion - right zygoma  x1, left nasal tip x1, right lateral eyebrow x1, right temple x1, right antihelix x1 Complexity: simple   Destruction method: cryotherapy   Informed consent: discussed and consent obtained   Timeout:  patient name, date of birth, surgical site, and procedure verified Lesion destroyed using liquid nitrogen: Yes   Region frozen until ice ball extended beyond lesion: Yes   Cryo cycles: 1 or 2. Outcome: patient tolerated procedure well with no complications   Post-procedure details: wound care instructions given    Seborrheic keratoses  SEBORRHEIC KERATOSIS - Stuck-on, waxy, tan-brown papules and/or plaques at dorsal hands - Benign-appearing - Discussed benign etiology and prognosis. - Observe - Call for any changes   Return for TBSE As Scheduled.  I, Lawson Radar, CMA, am acting as scribe for Elie Goody, MD.   Documentation: I have reviewed the above documentation for accuracy and completeness, and I agree with the above.  Elie Goody, MD

## 2023-04-18 DIAGNOSIS — H26493 Other secondary cataract, bilateral: Secondary | ICD-10-CM | POA: Diagnosis not present

## 2023-06-05 ENCOUNTER — Encounter: Payer: Self-pay | Admitting: Dermatology

## 2023-06-05 ENCOUNTER — Ambulatory Visit: Admitting: Dermatology

## 2023-06-05 DIAGNOSIS — L821 Other seborrheic keratosis: Secondary | ICD-10-CM | POA: Diagnosis not present

## 2023-06-05 DIAGNOSIS — Z1283 Encounter for screening for malignant neoplasm of skin: Secondary | ICD-10-CM

## 2023-06-05 DIAGNOSIS — D692 Other nonthrombocytopenic purpura: Secondary | ICD-10-CM

## 2023-06-05 DIAGNOSIS — D229 Melanocytic nevi, unspecified: Secondary | ICD-10-CM

## 2023-06-05 DIAGNOSIS — L570 Actinic keratosis: Secondary | ICD-10-CM | POA: Diagnosis not present

## 2023-06-05 DIAGNOSIS — B353 Tinea pedis: Secondary | ICD-10-CM | POA: Diagnosis not present

## 2023-06-05 DIAGNOSIS — D1801 Hemangioma of skin and subcutaneous tissue: Secondary | ICD-10-CM

## 2023-06-05 DIAGNOSIS — W908XXA Exposure to other nonionizing radiation, initial encounter: Secondary | ICD-10-CM

## 2023-06-05 DIAGNOSIS — I781 Nevus, non-neoplastic: Secondary | ICD-10-CM | POA: Diagnosis not present

## 2023-06-05 DIAGNOSIS — L578 Other skin changes due to chronic exposure to nonionizing radiation: Secondary | ICD-10-CM

## 2023-06-05 DIAGNOSIS — Z79899 Other long term (current) drug therapy: Secondary | ICD-10-CM

## 2023-06-05 DIAGNOSIS — L814 Other melanin hyperpigmentation: Secondary | ICD-10-CM

## 2023-06-05 DIAGNOSIS — I8393 Asymptomatic varicose veins of bilateral lower extremities: Secondary | ICD-10-CM

## 2023-06-05 DIAGNOSIS — Z7189 Other specified counseling: Secondary | ICD-10-CM

## 2023-06-05 DIAGNOSIS — L820 Inflamed seborrheic keratosis: Secondary | ICD-10-CM

## 2023-06-05 MED ORDER — KETOCONAZOLE 2 % EX CREA: TOPICAL_CREAM | CUTANEOUS | 11 refills | Status: AC

## 2023-06-05 NOTE — Progress Notes (Signed)
 Follow-Up Visit   Subjective  Christopher Henderson is a 78 y.o. male who presents for the following: Skin Cancer Screening and Full Body Skin Exam Hx of aks, hx of isk  Spots at face,  Spot at left cheek  The patient presents for Total-Body Skin Exam (TBSE) for skin cancer screening and mole check. The patient has spots, moles and lesions to be evaluated, some may be new or changing and the patient may have concern these could be cancer.  The following portions of the chart were reviewed this encounter and updated as appropriate: medications, allergies, medical history  Review of Systems:  No other skin or systemic complaints except as noted in HPI or Assessment and Plan.  Objective  Well appearing patient in no apparent distress; mood and affect are within normal limits.  A full examination was performed including scalp, head, eyes, ears, nose, lips, neck, chest, axillae, abdomen, back, buttocks, bilateral upper extremities, bilateral lower extremities, hands, feet, fingers, toes, fingernails, and toenails. All findings within normal limits unless otherwise noted below.   Relevant physical exam findings are noted in the Assessment and Plan.  left medial cheek zygoma x 1, left superior forehead x 1, right forearm x 1, right thigh x 1 (4) Erythematous stuck-on, waxy papule or plaque right tragus x 1 Erythematous thin papules/macules with gritty scale.   Assessment & Plan   SKIN CANCER SCREENING PERFORMED TODAY.  ACTINIC DAMAGE - Chronic condition, secondary to cumulative UV/sun exposure - diffuse scaly erythematous macules with underlying dyspigmentation - Recommend daily broad spectrum sunscreen SPF 30+ to sun-exposed areas, reapply every 2 hours as needed.  - Staying in the shade or wearing long sleeves, sun glasses (UVA+UVB protection) and wide brim hats (4-inch brim around the entire circumference of the hat) are also recommended for sun protection.  - Call for new or changing  lesions.  LENTIGINES, SEBORRHEIC KERATOSES, HEMANGIOMAS - Benign normal skin lesions - Benign-appearing - Call for any changes  MELANOCYTIC NEVI - Tan-brown and/or pink-flesh-colored symmetric macules and papules - Benign appearing on exam today - Observation - Call clinic for new or changing moles - Recommend daily use of broad spectrum spf 30+ sunscreen to sun-exposed areas.   Purpura - Chronic; persistent and recurrent.  Treatable, but not curable. At arms - Violaceous macules and patches - Benign - Related to trauma, age, sun damage and/or use of blood thinners, chronic use of topical and/or oral steroids - Observe - Can use OTC arnica containing moisturizer such as Dermend Bruise Formula if desired - Call for worsening or other concerns   Varicose Veins/Spider Veins - Dilated blue, purple or red veins at the lower extremities - Reassured - Smaller vessels can be treated by sclerotherapy (a procedure to inject a medicine into the veins to make them disappear) if desired, but the treatment is not covered by insurance. Larger vessels may be covered if symptomatic and we would refer to vascular surgeon if treatment desired.  TINEA PEDIS Exam: Scaling and maceration web spaces and over distal and lateral soles. Chronic and persistent condition with duration or expected duration over one year. Condition is symptomatic / bothersome to patient. Not to goal. Treatment Plan: Ketoconazole cream apply topically to feet at bedtime to entire foot TINEA PEDIS OF BOTH FEET   Related Medications ketoconazole (NIZORAL) 2 % cream Apply topically to both feet nightly for tinea pedis INFLAMED SEBORRHEIC KERATOSIS (4) left medial cheek zygoma x 1, left superior forehead x 1, right forearm x  1, right thigh x 1 (4) Symptomatic, irritating, patient would like treated. Destruction of lesion - left medial cheek zygoma x 1, left superior forehead x 1, right forearm x 1, right thigh x 1  (4) Complexity: simple   Destruction method: cryotherapy   Informed consent: discussed and consent obtained   Timeout:  patient name, date of birth, surgical site, and procedure verified Lesion destroyed using liquid nitrogen: Yes   Region frozen until ice ball extended beyond lesion: Yes   Outcome: patient tolerated procedure well with no complications   Post-procedure details: wound care instructions given   ACTINIC KERATOSIS right tragus x 1 Actinic keratoses are precancerous spots that appear secondary to cumulative UV radiation exposure/sun exposure over time. They are chronic with expected duration over 1 year. A portion of actinic keratoses will progress to squamous cell carcinoma of the skin. It is not possible to reliably predict which spots will progress to skin cancer and so treatment is recommended to prevent development of skin cancer.  Recommend daily broad spectrum sunscreen SPF 30+ to sun-exposed areas, reapply every 2 hours as needed.  Recommend staying in the shade or wearing long sleeves, sun glasses (UVA+UVB protection) and wide brim hats (4-inch brim around the entire circumference of the hat). Call for new or changing lesions. Destruction of lesion - right tragus x 1 Complexity: simple   Destruction method: cryotherapy   Informed consent: discussed and consent obtained   Timeout:  patient name, date of birth, surgical site, and procedure verified Lesion destroyed using liquid nitrogen: Yes   Region frozen until ice ball extended beyond lesion: Yes   Outcome: patient tolerated procedure well with no complications   Post-procedure details: wound care instructions given   Return in about 1 year (around 06/04/2024) for TBSE.  IAsher Muir, CMA, am acting as scribe for Armida Sans, MD.   Documentation: I have reviewed the above documentation for accuracy and completeness, and I agree with the above.  Armida Sans, MD

## 2023-06-05 NOTE — Patient Instructions (Addendum)

## 2023-06-07 ENCOUNTER — Ambulatory Visit: Payer: Medicare HMO | Admitting: Dermatology

## 2023-06-14 DIAGNOSIS — M170 Bilateral primary osteoarthritis of knee: Secondary | ICD-10-CM | POA: Diagnosis not present

## 2023-07-07 DIAGNOSIS — M170 Bilateral primary osteoarthritis of knee: Secondary | ICD-10-CM | POA: Diagnosis not present

## 2023-08-17 ENCOUNTER — Encounter: Payer: Self-pay | Admitting: Family Medicine

## 2023-08-17 ENCOUNTER — Ambulatory Visit: Payer: Self-pay | Admitting: Family Medicine

## 2023-08-17 VITALS — BP 123/80 | HR 70 | Temp 98.0°F | Ht 72.0 in | Wt 202.0 lb

## 2023-08-17 DIAGNOSIS — N138 Other obstructive and reflux uropathy: Secondary | ICD-10-CM

## 2023-08-17 DIAGNOSIS — D692 Other nonthrombocytopenic purpura: Secondary | ICD-10-CM | POA: Diagnosis not present

## 2023-08-17 DIAGNOSIS — I10 Essential (primary) hypertension: Secondary | ICD-10-CM | POA: Diagnosis not present

## 2023-08-17 DIAGNOSIS — Z0000 Encounter for general adult medical examination without abnormal findings: Secondary | ICD-10-CM | POA: Diagnosis not present

## 2023-08-17 DIAGNOSIS — N401 Enlarged prostate with lower urinary tract symptoms: Secondary | ICD-10-CM | POA: Diagnosis not present

## 2023-08-17 DIAGNOSIS — Z136 Encounter for screening for cardiovascular disorders: Secondary | ICD-10-CM | POA: Diagnosis not present

## 2023-08-17 DIAGNOSIS — Z1211 Encounter for screening for malignant neoplasm of colon: Secondary | ICD-10-CM | POA: Diagnosis not present

## 2023-08-17 LAB — MICROALBUMIN, URINE WAIVED
Creatinine, Urine Waived: 200 mg/dL (ref 10–300)
Microalb, Ur Waived: 80 mg/L — ABNORMAL HIGH (ref 0–19)
Microalb/Creat Ratio: <30 mg/g (ref <30)

## 2023-08-17 MED ORDER — PANTOPRAZOLE SODIUM 40 MG PO TBEC: 40.0000 mg | DELAYED_RELEASE_TABLET | Freq: Every day | ORAL | 1 refills | Status: DC

## 2023-08-17 MED ORDER — AMLODIPINE BESYLATE 5 MG PO TABS: 5.0000 mg | ORAL_TABLET | Freq: Every day | ORAL | 1 refills | Status: DC

## 2023-08-17 MED ORDER — BENAZEPRIL HCL 40 MG PO TABS: 40.0000 mg | ORAL_TABLET | Freq: Every day | ORAL | 1 refills | Status: DC

## 2023-08-17 NOTE — Progress Notes (Signed)
 BP 123/80 (BP Location: Left Arm, Patient Position: Sitting)   Pulse 70   Temp 98 F (36.7 C) (Oral)   Ht 6' (1.829 m)   Wt 202 lb (91.6 kg)   SpO2 97%   BMI 27.40 kg/m    Subjective:    Patient ID: Christopher Henderson, male    DOB: Jun 26, 1945, 78 y.o.   MRN: [id number]  HPI: Christopher Henderson is a 78 y.o. male presenting on 08/17/2023 for comprehensive medical examination. Current medical complaints include:  Has been having some problems with his L knee and was seeing Ortho. He has been just taking meloxicam.  HYPERTENSION  Hypertension status: controlled  Satisfied with current treatment? yes Duration of hypertension: chronic BP monitoring frequency:  not checking BP medication side effects:  no Medication compliance: excellent compliance Previous BP meds: benazepril , amlodipine  Aspirin: yes Recurrent headaches: no Visual changes: no Palpitations: no Dyspnea: no Chest pain: no Lower extremity edema: no Dizzy/lightheaded: no   He currently lives with: wife Interim Problems from his last visit: no  Functional Status Survey: Is the patient deaf or have difficulty hearing?: Yes Does the patient have difficulty seeing, even when wearing glasses/contacts?: Yes (Sometimes) Does the patient have difficulty concentrating, remembering, or making decisions?: No Does the patient have difficulty walking or climbing stairs?: No Does the patient have difficulty dressing or bathing?: No Does the patient have difficulty doing errands alone such as visiting a doctor's office or shopping?: No  FALL RISK:    08/16/2022    8:11 AM 02/14/2022    9:02 AM 08/11/2021    9:57 AM 04/12/2021   10:41 AM 07/20/2020    9:16 AM  Fall Risk   Falls in the past year? 0 0 0 0 0  Number falls in past yr: 0 0 0 0 0  Injury with Fall? 0 0 0 0 0  Risk for fall due to : No Fall Risks No Fall Risks No Fall Risks  No Fall Risks  Follow up Falls evaluation completed Falls evaluation completed Falls  evaluation completed Falls evaluation completed;Falls prevention discussed Falls evaluation completed    Depression Screen    08/17/2023    8:08 AM 08/16/2022    8:11 AM 02/14/2022    9:02 AM 08/11/2021    9:57 AM 04/12/2021   10:51 AM  Depression screen PHQ 2/9  Decreased Interest 0 0 0 0 0  Down, Depressed, Hopeless 0 0 0 0 0  PHQ - 2 Score 0 0 0 0 0  Altered sleeping  0 0 0   Tired, decreased energy  0 0 0   Change in appetite  0 0 0   Feeling bad or failure about yourself   0 0 0   Trouble concentrating  0 0 0   Moving slowly or fidgety/restless  0 0 0   Suicidal thoughts  0 0 0   PHQ-9 Score  0 0 0   Difficult doing work/chores  Not difficult at all Not difficult at all Not difficult at all     Advanced Directives Does patient have a HCPOA?    yes If yes, name and contact information:  Does patient have a living will or MOST form?  yes  Past Medical History:  Past Medical History:  Diagnosis Date   Actinic keratosis    Dental bridge present    also some implants   Hypertension    Polyp of ascending colon    Senile purpura (HCC) 01/09/2018  Surgical History:  Past Surgical History:  Procedure Laterality Date   COLONOSCOPY WITH PROPOFOL  N/A 12/31/2018   Procedure: COLONOSCOPY WITH BIOPSY;  Surgeon: Marnee Sink, MD;  Location: [facility];  Service: Endoscopy;  Laterality: N/A;   ESOPHAGEAL DILATION  06/23/2016   Procedure: ESOPHAGEAL DILATION;  Surgeon: Marnee Sink, MD;  Location: [facility];  Service: Endoscopy;;   ESOPHAGEAL DILATION  08/12/2016   Procedure: ESOPHAGEAL DILATION;  Surgeon: Marnee Sink, MD;  Location: [facility];  Service: Endoscopy;;   ESOPHAGOGASTRODUODENOSCOPY (EGD) WITH PROPOFOL  N/A 06/23/2016   Procedure: ESOPHAGOGASTRODUODENOSCOPY (EGD) WITH PROPOFOL ;  Surgeon: Marnee Sink, MD;  Location: [facility];  Service: Endoscopy;  Laterality: N/A;   ESOPHAGOGASTRODUODENOSCOPY (EGD) WITH PROPOFOL  N/A 08/12/2016    Procedure: ESOPHAGOGASTRODUODENOSCOPY (EGD) WITH PROPOFOL ;  Surgeon: Marnee Sink, MD;  Location: [facility];  Service: Endoscopy;  Laterality: N/A;   EYE SURGERY  2014   cataract Jan and Feb with implants   HAND / FINGER TENDON LESION EXCISION Left 2000   I & D EXTREMITY Left 06/18/2012   Procedure: Irrigation and Debridement with wound exploration Left Hand and percutaneous pinning of left Fifth finger and third metacarpal;  Surgeon: Hedy Living, MD;  Location: [facility] OR;  Service: Orthopedics;  Laterality: Left;   KNEE SURGERY Left 1972   POLYPECTOMY N/A 12/31/2018   Procedure: POLYPECTOMY;  Surgeon: Marnee Sink, MD;  Location: [facility];  Service: Endoscopy;  Laterality: N/A;    Medications:  Current Outpatient Medications on File Prior to Visit  Medication Sig   aspirin 81 MG tablet Take 81 mg by mouth daily.   dutasteride (AVODART) 0.5 MG capsule Take 0.5 mg by mouth every other day.   ketoconazole  (NIZORAL ) 2 % cream Apply topically to both feet nightly for tinea pedis   latanoprost (XALATAN) 0.005 % ophthalmic solution Place 1 drop into both eyes at bedtime.   meloxicam (MOBIC) 15 MG tablet TAKE 1 TABLET EVERY DAY BY ORAL ROUTE WITH MEAL(S).   Omega-3 Fatty Acids (FISH OIL) 1000 MG CAPS Take 1,000 mg by mouth 2 (two) times daily.   prednisoLONE acetate (PRED FORTE) 1 % ophthalmic suspension 1 drop 4 (four) times daily.   sildenafil (VIAGRA) 100 MG tablet Take 100 mg by mouth daily as needed for erectile dysfunction.   TURMERIC CURCUMIN PO Take 500 mg by mouth daily.   UNABLE TO FIND Take 20 mg by mouth daily. Eye Shield Plus Lutein   No current facility-administered medications on file prior to visit.    Allergies:  Allergies  Allergen Reactions   Penicillins Hives   Shrimp [Shellfish Allergy] Hives    Social History:  Social History   Socioeconomic History   Marital status: Married    Spouse name: Not on file   Number of children: Not on file    Years of education: Not on file   Highest education level: Associate degree: occupational, Scientist, product/process development, or vocational program  Occupational History   Occupation: retired  Tobacco Use   Smoking status: Never   Smokeless tobacco: Never  Vaping Use   Vaping status: Never Used  Substance and Sexual Activity   Alcohol use: No   Drug use: No   Sexual activity: Yes  Other Topics Concern   Not on file  Social History Narrative   Not on file   Social Drivers of Health   Financial Resource Strain: Low Risk  (08/17/2023)   Overall Financial Resource Strain (CARDIA)    Difficulty of Paying  Living Expenses: Not hard at all  Food Insecurity: No Food Insecurity (08/17/2023)   Hunger Vital Sign    Worried About Running Out of Food in the Last Year: Never true    Ran Out of Food in the Last Year: Never true  Transportation Needs: No Transportation Needs (08/17/2023)   PRAPARE - Administrator, Civil Service (Medical): No    Lack of Transportation (Non-Medical): No  Physical Activity: Sufficiently Active (08/17/2023)   Exercise Vital Sign    Days of Exercise per Week: 6 days    Minutes of Exercise per Session: 40 min  Stress: No Stress Concern Present (08/17/2023)   [employer] of Occupational Health - Occupational Stress Questionnaire    Feeling of Stress : Not at all  Social Connections: Socially Integrated (08/17/2023)   Social Connection and Isolation Panel [NHANES]    Frequency of Communication with Friends and Family: Three times a week    Frequency of Social Gatherings with Friends and Family: More than three times a week    Attends Religious Services: More than 4 times per year    Active Member of [employer] or Organizations: Yes    Attends Engineer, structural: More than 4 times per year    Marital Status: Married  Catering manager Violence: Not At Risk (08/17/2023)   Humiliation, Afraid, Rape, and Kick questionnaire    Fear of Current or Ex-Partner: No     Emotionally Abused: No    Physically Abused: No    Sexually Abused: No   Social History   Tobacco Use  Smoking Status Never  Smokeless Tobacco Never   Social History   Substance and Sexual Activity  Alcohol Use No    Family History:  Family History  Problem Relation Age of Onset   Hypertension Mother    Diabetes Mother    Thyroid  disease Mother    Glaucoma Father    Cancer Sister        colon and breast   Alzheimer's disease Sister    Diabetes Brother    Heart disease Brother    Seizures Sister    Dementia Sister     Past medical history, surgical history, medications, allergies, family history and social history reviewed with patient today and changes made to appropriate areas of the chart.   Review of Systems  Constitutional: Negative.   HENT: Negative.    Eyes: Negative.   Respiratory: Negative.    Cardiovascular: Negative.   Gastrointestinal: Negative.   Genitourinary: Negative.   Musculoskeletal:  Positive for joint pain. Negative for back pain, falls, myalgias and neck pain.  Skin: Negative.   Neurological: Negative.   Endo/Heme/Allergies:  Negative for environmental allergies and polydipsia. Bruises/bleeds easily.  Psychiatric/Behavioral: Negative.     All other ROS negative except what is listed above and in the HPI.      Objective:     BP 123/80 (BP Location: Left Arm, Patient Position: Sitting)   Pulse 70   Temp 98 F (36.7 C) (Oral)   Ht 6' (1.829 m)   Wt 202 lb (91.6 kg)   SpO2 97%   BMI 27.40 kg/m   Wt Readings from Last 3 Encounters:  08/17/23 202 lb (91.6 kg)  02/16/23 195 lb 6.4 oz (88.6 kg)  08/16/22 205 lb (93 kg)    No results found.  Physical Exam Vitals and nursing note reviewed.  Constitutional:      General: He is not in acute distress.  Appearance: Normal appearance. He is not ill-appearing, toxic-appearing or diaphoretic.  HENT:     Head: Normocephalic and atraumatic.     Right Ear: Tympanic membrane, ear canal  and external ear normal. There is no impacted cerumen.     Left Ear: Tympanic membrane, ear canal and external ear normal. There is no impacted cerumen.     Nose: Nose normal. No congestion or rhinorrhea.     Mouth/Throat:     Mouth: Mucous membranes are moist.     Pharynx: Oropharynx is clear. No oropharyngeal exudate or posterior oropharyngeal erythema.  Eyes:     General: No scleral icterus.       Right eye: No discharge.        Left eye: No discharge.     Extraocular Movements: Extraocular movements intact.     Conjunctiva/sclera: Conjunctivae normal.     Pupils: Pupils are equal, round, and reactive to light.  Neck:     Vascular: No carotid bruit.  Cardiovascular:     Rate and Rhythm: Normal rate and regular rhythm.     Pulses: Normal pulses.     Heart sounds: No murmur heard.    No friction rub. No gallop.  Pulmonary:     Effort: Pulmonary effort is normal. No respiratory distress.     Breath sounds: Normal breath sounds. No stridor. No wheezing, rhonchi or rales.  Chest:     Chest wall: No tenderness.  Abdominal:     General: Abdomen is flat. Bowel sounds are normal. There is no distension.     Palpations: Abdomen is soft. There is no mass.     Tenderness: There is no abdominal tenderness. There is no right CVA tenderness, left CVA tenderness, guarding or rebound.     Hernia: No hernia is present.  Genitourinary:    Comments: Genital exam deferred with shared decision making Musculoskeletal:        General: No swelling, tenderness, deformity or signs of injury.     Cervical back: Normal range of motion and neck supple. No rigidity. No muscular tenderness.     Right lower leg: No edema.     Left lower leg: No edema.  Lymphadenopathy:     Cervical: No cervical adenopathy.  Skin:    General: Skin is warm and dry.     Capillary Refill: Capillary refill takes less than 2 seconds.     Coloration: Skin is not jaundiced or pale.     Findings: No bruising, erythema, lesion or  rash.  Neurological:     General: No focal deficit present.     Mental Status: He is alert and oriented to person, place, and time.     Cranial Nerves: No cranial nerve deficit.     Sensory: No sensory deficit.     Motor: No weakness.     Coordination: Coordination normal.     Gait: Gait normal.     Deep Tendon Reflexes: Reflexes normal.  Psychiatric:        Mood and Affect: Mood normal.        Behavior: Behavior normal.        Thought Content: Thought content normal.        Judgment: Judgment normal.        08/17/2023    8:08 AM 08/17/2023    8:07 AM 08/16/2022    8:41 AM 04/06/2020    3:22 PM 04/01/2019   10:33 AM  6CIT Screen  What Year?   0 points 0 points  0 points  What month?   0 points 0 points 0 points  What time?  0 points 0 points 0 points 0 points  Count back from 20 0 points  0 points 0 points 2 points  Months in reverse 0 points  0 points 0 points 0 points  Repeat phrase 2 points  8 points 0 points 0 points  Total Score   8 points 0 points 2 points                              Results for orders placed or performed in visit on 08/17/23  Microalbumin, Urine Waived   Collection Time: 08/17/23  8:32 AM  Result Value Ref Range   Microalb, Ur Waived 80 (H) 0 - 19 mg/L   Creatinine, Urine Waived 200 10 - 300 mg/dL   Microalb/Creat Ratio <30 <30 mg/g  Comprehensive metabolic panel with GFR   Collection Time: 08/17/23  8:33 AM  Result Value Ref Range   Glucose 97 70 - 99 mg/dL   BUN 19 8 - 27 mg/dL   Creatinine, Ser 4.09 0.76 - 1.27 mg/dL   eGFR 60 >81 XB/JYN/8.29   BUN/Creatinine Ratio 15 10 - 24   Sodium 140 134 - 144 mmol/L   Potassium 4.7 3.5 - 5.2 mmol/L   Chloride 102 96 - 106 mmol/L   CO2 22 20 - 29 mmol/L   Calcium 9.8 8.6 - 10.2 mg/dL   Total Protein 7.0 6.0 - 8.5 g/dL   Albumin 4.6 3.8 - 4.8 g/dL   Globulin, Total 2.4 1.5 - 4.5 g/dL   Bilirubin Total 0.8 0.0 - 1.2 mg/dL   Alkaline Phosphatase 68 44 - 121 IU/L   AST 21 0 - 40 IU/L    ALT 19 0 - 44 IU/L  CBC with Differential/Platelet   Collection Time: 08/17/23  8:33 AM  Result Value Ref Range   WBC 5.0 3.4 - 10.8 x10E3/uL   RBC 4.94 4.14 - 5.80 x10E6/uL   Hemoglobin 15.0 13.0 - 17.7 g/dL   Hematocrit 56.2 13.0 - 51.0 %   MCV 94 79 - 97 fL   MCH 30.4 26.6 - 33.0 pg   MCHC 32.2 31.5 - 35.7 g/dL   RDW 86.5 78.4 - 69.6 %   Platelets 203 150 - 450 x10E3/uL   Neutrophils 47 Not Estab. %   Lymphs 36 Not Estab. %   Monocytes 8 Not Estab. %   Eos 8 Not Estab. %   Basos 1 Not Estab. %   Neutrophils Absolute 2.4 1.4 - 7.0 x10E3/uL   Lymphocytes Absolute 1.8 0.7 - 3.1 x10E3/uL   Monocytes Absolute 0.4 0.1 - 0.9 x10E3/uL   EOS (ABSOLUTE) 0.4 0.0 - 0.4 x10E3/uL   Basophils Absolute 0.0 0.0 - 0.2 x10E3/uL   Immature Granulocytes 0 Not Estab. %   Immature Grans (Abs) 0.0 0.0 - 0.1 x10E3/uL  Lipid Panel w/o Chol/HDL Ratio   Collection Time: 08/17/23  8:33 AM  Result Value Ref Range   Cholesterol, Total 196 100 - 199 mg/dL   Triglycerides 97 0 - 149 mg/dL   HDL 44 >29 mg/dL   VLDL Cholesterol Cal 18 5 - 40 mg/dL   LDL Chol Calc (NIH) 528 (H) 0 - 99 mg/dL  PSA   Collection Time: 08/17/23  8:33 AM  Result Value Ref Range   Prostate Specific Ag, Serum 3.5 0.0 - 4.0 ng/mL  TSH   Collection  Time: 08/17/23  8:33 AM  Result Value Ref Range   TSH 2.040 0.450 - 4.500 uIU/mL      Assessment & Plan:   Problem List Items Addressed This Visit       Cardiovascular and Mediastinum   Essential hypertension   Under good control on current regimen. Continue current regimen. Continue to monitor. Call with any concerns. Refills given. Labs drawn today.        Relevant Medications   amLODipine  (NORVASC ) 5 MG tablet   benazepril  (LOTENSIN ) 40 MG tablet   Other Relevant Orders   Comprehensive metabolic panel with GFR (Completed)   TSH (Completed)   Microalbumin, Urine Waived (Completed)   Senile purpura (HCC)   Reassured patient. Continue to monitor.       Relevant  Medications   amLODipine  (NORVASC ) 5 MG tablet   benazepril  (LOTENSIN ) 40 MG tablet   Other Relevant Orders   CBC with Differential/Platelet (Completed)     Genitourinary   BPH with obstruction/lower urinary tract symptoms   Under good control on current regimen. Continue current regimen. Continue to monitor. Call with any concerns. Refills given. Labs drawn today.       Relevant Orders   PSA (Completed)   Other Visit Diagnoses       Encounter for Medicare annual wellness exam    -  Primary   Preventative care discussed today as below.     Routine general medical examination at a health care facility       Vaccines up to date. Screening labs checked today. Colonoscopy due in October. Continue diet and exercise. Call with any concerns.     Screening for cardiovascular condition       Labs drawn today. Await results. Treat as needed.   Relevant Orders   Lipid Panel w/o Chol/HDL Ratio (Completed)     Screening for colon cancer       Referral placed today- due in the fall.   Relevant Orders   Ambulatory referral to Gastroenterology        Preventative Services:  Health Risk Assessment and Personalized Prevention Plan: Done today Bone Mass Measurements: N/A CVD Screening: Done today Colon Cancer Screening: Ordered today Depression Screening: Done today Diabetes Screening: Done today Glaucoma Screening: See your eye doctor Hepatitis B vaccine: N/A Hepatitis C screening: Up to date HIV Screening: up to date Flu Vaccine: get in the fall Lung cancer Screening: N/A Obesity Screening: Done today Pneumonia Vaccines: Up to date STI Screening: N/A PSA screening: N/A  Discussed aspirin prophylaxis for myocardial infarction prevention and decision was made to continue ASA  LABORATORY TESTING:  Health maintenance labs ordered today as discussed above.   IMMUNIZATIONS:   - Tdap: Tetanus vaccination status reviewed: last tetanus booster within 10 years. - Influenza: Up to  date - Pneumovax: Up to date - Prevnar: Up to date - Zostavax vaccine: Up to date  SCREENING: - Colonoscopy: Up to date  Discussed with patient purpose of the colonoscopy is to detect colon cancer at curable precancerous or early stages   PATIENT COUNSELING:    Sexuality: Discussed sexually transmitted diseases, partner selection, use of condoms, avoidance of unintended pregnancy  and contraceptive alternatives.   Advised to avoid cigarette smoking.  I discussed with the patient that most people either abstain from alcohol or drink within safe limits (<=14/week and <=4 drinks/occasion for males, <=7/weeks and <= 3 drinks/occasion for females) and that the risk for alcohol disorders and other health effects rises proportionally with  the number of drinks per week and how often a drinker exceeds daily limits.  Discussed cessation/primary prevention of drug use and availability of treatment for abuse.   Diet: Encouraged to adjust caloric intake to maintain  or achieve ideal body weight, to reduce intake of dietary saturated fat and total fat, to limit sodium intake by avoiding high sodium foods and not adding table salt, and to maintain adequate dietary potassium and calcium preferably from fresh fruits, vegetables, and low-fat dairy products.    stressed the importance of regular exercise  Injury prevention: Discussed safety belts, safety helmets, smoke detector, smoking near bedding or upholstery.   Dental health: Discussed importance of regular tooth brushing, flossing, and dental visits.   Follow up plan: NEXT PREVENTATIVE PHYSICAL DUE IN 1 YEAR. Return in about 6 months (around 02/17/2024).

## 2023-08-17 NOTE — Progress Notes (Signed)
 Subjective:   Christopher Henderson is a 78 y.o. male who presents for Medicare Annual/Subsequent preventive examination.  Visit Complete: In person  Patient Medicare AWV questionnaire was completed by the patient on 08/17/23; I have confirmed that all information answered by patient is correct and no changes since this date.  Cardiac Risk Factors include: advanced age (>62men, >21 women);hypertension;male gender     Objective:    Today's Vitals   08/17/23 0836  BP: 123/80  Pulse: 70  Temp: 98 F (36.7 C)  TempSrc: Oral  SpO2: 97%  Weight: 202 lb (91.6 kg)  Height: 6' (1.829 m)  PainSc: 0-No pain   Body mass index is 27.4 kg/m.     04/12/2021   10:46 AM 04/06/2020    3:19 PM 12/31/2018   11:01 AM 08/12/2016    7:44 AM 06/23/2016   10:02 AM 06/19/2012    1:00 PM  Advanced Directives  Does Patient Have a Medical Advance Directive? Yes Yes Yes Yes Yes Patient does not have advance directive  Type of Advance Directive Living will Healthcare Power of [place];Living will Healthcare Power of [place];Living will Healthcare Power of [place];Living will Healthcare Power of [place];Living will   Does patient want to make changes to medical advance directive?   No - Patient declined No - Patient declined    Copy of Healthcare Power of Attorney in Chart? No - copy requested No - copy requested No - copy requested No - copy requested No - copy requested   Pre-existing out of facility DNR order (yellow form or pink MOST form)      No    Current Medications (verified) Outpatient Encounter Medications as of 08/17/2023  Medication Sig   aspirin 81 MG tablet Take 81 mg by mouth daily.   dutasteride (AVODART) 0.5 MG capsule Take 0.5 mg by mouth every other day.   ketoconazole  (NIZORAL ) 2 % cream Apply topically to both feet nightly for tinea pedis   latanoprost (XALATAN) 0.005 % ophthalmic solution Place 1 drop into both eyes at bedtime.   meloxicam (MOBIC) 15 MG tablet TAKE 1 TABLET EVERY  DAY BY ORAL ROUTE WITH MEAL(S).   Omega-3 Fatty Acids (FISH OIL) 1000 MG CAPS Take 1,000 mg by mouth 2 (two) times daily.   prednisoLONE acetate (PRED FORTE) 1 % ophthalmic suspension 1 drop 4 (four) times daily.   sildenafil (VIAGRA) 100 MG tablet Take 100 mg by mouth daily as needed for erectile dysfunction.   TURMERIC CURCUMIN PO Take 500 mg by mouth daily.   UNABLE TO FIND Take 20 mg by mouth daily. Eye Shield Plus Lutein   [DISCONTINUED] amLODipine  (NORVASC ) 5 MG tablet Take 1 tablet (5 mg total) by mouth daily.   [DISCONTINUED] benazepril  (LOTENSIN ) 40 MG tablet Take 1 tablet (40 mg total) by mouth daily.   [DISCONTINUED] pantoprazole  (PROTONIX ) 40 MG tablet Take 1 tablet (40 mg total) by mouth daily.   amLODipine  (NORVASC ) 5 MG tablet Take 1 tablet (5 mg total) by mouth daily.   benazepril  (LOTENSIN ) 40 MG tablet Take 1 tablet (40 mg total) by mouth daily.   pantoprazole  (PROTONIX ) 40 MG tablet Take 1 tablet (40 mg total) by mouth daily.   No facility-administered encounter medications on file as of 08/17/2023.    Allergies (verified) Penicillins and Shrimp [shellfish allergy]   History: Past Medical History:  Diagnosis Date   Actinic keratosis    Dental bridge present    also some implants   Hypertension  Polyp of ascending colon    Senile purpura (HCC) 01/09/2018   Past Surgical History:  Procedure Laterality Date   COLONOSCOPY WITH PROPOFOL  N/A 12/31/2018   Procedure: COLONOSCOPY WITH BIOPSY;  Surgeon: Marnee Sink, MD;  Location: [facility];  Service: Endoscopy;  Laterality: N/A;   ESOPHAGEAL DILATION  06/23/2016   Procedure: ESOPHAGEAL DILATION;  Surgeon: Marnee Sink, MD;  Location: [facility];  Service: Endoscopy;;   ESOPHAGEAL DILATION  08/12/2016   Procedure: ESOPHAGEAL DILATION;  Surgeon: Marnee Sink, MD;  Location: [facility];  Service: Endoscopy;;   ESOPHAGOGASTRODUODENOSCOPY (EGD) WITH PROPOFOL  N/A 06/23/2016   Procedure:  ESOPHAGOGASTRODUODENOSCOPY (EGD) WITH PROPOFOL ;  Surgeon: Marnee Sink, MD;  Location: [facility];  Service: Endoscopy;  Laterality: N/A;   ESOPHAGOGASTRODUODENOSCOPY (EGD) WITH PROPOFOL  N/A 08/12/2016   Procedure: ESOPHAGOGASTRODUODENOSCOPY (EGD) WITH PROPOFOL ;  Surgeon: Marnee Sink, MD;  Location: [facility];  Service: Endoscopy;  Laterality: N/A;   EYE SURGERY  2014   cataract Jan and Feb with implants   HAND / FINGER TENDON LESION EXCISION Left 2000   I & D EXTREMITY Left 06/18/2012   Procedure: Irrigation and Debridement with wound exploration Left Hand and percutaneous pinning of left Fifth finger and third metacarpal;  Surgeon: Hedy Living, MD;  Location: [facility] OR;  Service: Orthopedics;  Laterality: Left;   KNEE SURGERY Left 1972   POLYPECTOMY N/A 12/31/2018   Procedure: POLYPECTOMY;  Surgeon: Marnee Sink, MD;  Location: [facility];  Service: Endoscopy;  Laterality: N/A;   Family History  Problem Relation Age of Onset   Hypertension Mother    Diabetes Mother    Thyroid  disease Mother    Glaucoma Father    Cancer Sister        colon and breast   Alzheimer's disease Sister    Diabetes Brother    Heart disease Brother    Seizures Sister    Dementia Sister    Social History   Socioeconomic History   Marital status: Married    Spouse name: Not on file   Number of children: Not on file   Years of education: Not on file   Highest education level: Associate degree: occupational, Scientist, product/process development, or vocational program  Occupational History   Occupation: retired  Tobacco Use   Smoking status: Never   Smokeless tobacco: Never  Vaping Use   Vaping status: Never Used  Substance and Sexual Activity   Alcohol use: No   Drug use: No   Sexual activity: Yes  Other Topics Concern   Not on file  Social History Narrative   Not on file   Social Drivers of Health   Financial Resource Strain: Low Risk  (08/17/2023)   Overall Financial Resource Strain  (CARDIA)    Difficulty of Paying Living Expenses: Not hard at all  Food Insecurity: No Food Insecurity (08/17/2023)   Hunger Vital Sign    Worried About Running Out of Food in the Last Year: Never true    Ran Out of Food in the Last Year: Never true  Transportation Needs: No Transportation Needs (08/17/2023)   PRAPARE - Administrator, Civil Service (Medical): No    Lack of Transportation (Non-Medical): No  Physical Activity: Sufficiently Active (08/17/2023)   Exercise Vital Sign    Days of Exercise per Week: 6 days    Minutes of Exercise per Session: 40 min  Stress: No Stress Concern Present (08/17/2023)   [employer] of Occupational Health - Occupational Stress Questionnaire  Feeling of Stress : Not at all  Social Connections: Socially Integrated (08/17/2023)   Social Connection and Isolation Panel [NHANES]    Frequency of Communication with Friends and Family: Three times a week    Frequency of Social Gatherings with Friends and Family: More than three times a week    Attends Religious Services: More than 4 times per year    Active Member of Clubs or Organizations: Yes    Attends Engineer, structural: More than 4 times per year    Marital Status: Married    Tobacco Counseling Counseling given: Not Answered   Clinical Intake:     Pain Score: 0-No pain                  Activities of Daily Living    08/17/2023    8:05 AM  In your present state of health, do you have any difficulty performing the following activities:  Hearing? 1  Vision? 1  Comment Sometimes  Difficulty concentrating or making decisions? 0  Walking or climbing stairs? 0  Dressing or bathing? 0  Doing errands, shopping? 0  Preparing Food and eating ? N  Using the Toilet? N  In the past six months, have you accidently leaked urine? N  Do you have problems with loss of bowel control? N  Managing your Medications? N  Managing your Finances? N  Housekeeping or  managing your Housekeeping? N    Patient Care Team: Solomon Dupre, DO as PCP - General (Family Medicine)  Indicate any recent Medical Services you may have received from other than [facility] providers in the past year (date may be approximate).     Assessment:   This is a routine wellness examination for [place].  Hearing/Vision screen No results found.   Goals Addressed   None   Depression Screen    08/17/2023    8:08 AM 08/16/2022    8:11 AM 02/14/2022    9:02 AM 08/11/2021    9:57 AM 04/12/2021   10:51 AM 01/19/2021    9:16 AM 07/20/2020    9:16 AM  PHQ 2/9 Scores  PHQ - 2 Score 0 0 0 0 0 0 0  PHQ- 9 Score  0 0 0  0     Fall Risk    08/16/2022    8:11 AM 02/14/2022    9:02 AM 08/11/2021    9:57 AM 04/12/2021   10:41 AM 07/20/2020    9:16 AM  Fall Risk   Falls in the past year? 0 0 0 0 0  Number falls in past yr: 0 0 0 0 0  Injury with Fall? 0 0 0 0 0  Risk for fall due to : No Fall Risks No Fall Risks No Fall Risks  No Fall Risks  Follow up Falls evaluation completed Falls evaluation completed Falls evaluation completed Falls evaluation completed;Falls prevention discussed Falls evaluation completed    MEDICARE RISK AT HOME: Medicare Risk at Home Any stairs in or around the home?: Yes If so, are there any without handrails?: Yes Home free of loose throw rugs in walkways, pet beds, electrical cords, etc?: Yes Adequate lighting in your home to reduce risk of falls?: Yes Life alert?: No Use of a cane, walker or w/c?: No Grab bars in the bathroom?: No Shower chair or bench in shower?: No Elevated toilet seat or a handicapped toilet?: No  TIMED UP AND GO:  Was the test performed?  Yes  Length of time  to ambulate 10 feet: 15 sec Gait steady and fast without use of assistive device    Cognitive Function:        08/17/2023    8:08 AM 08/17/2023    8:07 AM 08/16/2022    8:41 AM 04/06/2020    3:22 PM 04/01/2019   10:33 AM  6CIT Screen  What Year?   0 points 0  points 0 points  What month?   0 points 0 points 0 points  What time?  0 points 0 points 0 points 0 points  Count back from 20 0 points  0 points 0 points 2 points  Months in reverse 0 points  0 points 0 points 0 points  Repeat phrase 2 points  8 points 0 points 0 points  Total Score   8 points 0 points 2 points    Immunizations Immunization History  Administered Date(s) Administered   Fluad Quad(high Dose 65+) 01/19/2021   Influenza, High Dose Seasonal PF 12/19/2016, 12/28/2017   Influenza,inj,Quad PF,6+ Mos 01/27/2019   Influenza-Unspecified 12/18/2014, 12/21/2021, 12/27/2022   PFIZER(Purple Top)SARS-COV-2 Vaccination 05/14/2019, 06/04/2019, 01/09/2020   Pneumococcal Conjugate-13 09/26/2013   Pneumococcal Polysaccharide-23 08/14/2012   Td 03/28/2000, 07/20/2020   Tdap 07/19/2010   Zoster Recombinant(Shingrix) 07/20/2020, 04/25/2021   Zoster, Live 05/28/2007    TDAP status: Up to date  Flu Vaccine status: Up to date  Pneumococcal vaccine status: Up to date  Covid-19 vaccine status: Declined, Education has been provided regarding the importance of this vaccine but patient still declined. Advised may receive this vaccine at local pharmacy or Health Dept.or vaccine clinic. Aware to provide a copy of the vaccination record if obtained from local pharmacy or Health Dept. Verbalized acceptance and understanding.  Qualifies for Shingles Vaccine? Yes   Zostavax completed Yes     Screening Tests Health Maintenance  Topic Date Due   COVID-19 Vaccine (4 - 2024-25 season) 11/27/2022   Medicare Annual Wellness (AWV)  08/16/2023   INFLUENZA VACCINE  10/27/2023   Colonoscopy  12/31/2023   DTaP/Tdap/Td (4 - Td or Tdap) 07/21/2030   Pneumonia Vaccine 37+ Years old  Completed   Hepatitis C Screening  Completed   Zoster Vaccines- Shingrix  Completed   HPV VACCINES  Aged Out   Meningococcal B Vaccine  Aged Out    Health Maintenance  Health Maintenance Due  Topic Date Due    COVID-19 Vaccine (4 - 2024-25 season) 11/27/2022   Medicare Annual Wellness (AWV)  08/16/2023    Colorectal cancer screening: Type of screening: Colonoscopy. Completed 12/31/18. Repeat every 5 years  Lung Cancer Screening: (Low Dose CT Chest recommended if Age 86-80 years, 20 pack-year currently smoking OR have quit w/in 15years.) does not qualify.    Additional Screening:  Hepatitis C Screening: does qualify; Completed 06/19/17  Dental Screening: Recommended annual dental exams for proper oral hygiene  Community Resource Referral / Chronic Care Management: CRR required this visit?  No   CCM required this visit?  No     Plan:     I have personally reviewed and noted the following in the patient's chart:   Medical and social history Use of alcohol, tobacco or illicit drugs  Current medications and supplements including opioid prescriptions. Patient is not currently taking opioid prescriptions. Functional ability and status Nutritional status Physical activity Advanced directives List of other physicians Hospitalizations, surgeries, and ER visits in previous 12 months Vitals Screenings to include cognitive, depression, and falls Referrals and appointments  In addition, I have reviewed  and discussed with patient certain preventive protocols, quality metrics, and best practice recommendations. A written personalized care plan for preventive services as well as general preventive health recommendations were provided to patient.     Terre Ferri, DO   08/18/2023   After Visit Summary: (In Person-Printed) AVS printed and given to the patient

## 2023-08-18 ENCOUNTER — Ambulatory Visit: Payer: Self-pay | Admitting: Family Medicine

## 2023-08-18 DIAGNOSIS — R972 Elevated prostate specific antigen [PSA]: Secondary | ICD-10-CM

## 2023-08-18 LAB — CBC WITH DIFFERENTIAL/PLATELET
Basophils Absolute: 0 10*3/uL (ref 0.0–0.2)
Basos: 1 %
EOS (ABSOLUTE): 0.4 10*3/uL (ref 0.0–0.4)
Eos: 8 %
Hematocrit: 46.6 % (ref 37.5–51.0)
Hemoglobin: 15 g/dL (ref 13.0–17.7)
Immature Grans (Abs): 0 10*3/uL (ref 0.0–0.1)
Immature Granulocytes: 0 %
Lymphocytes Absolute: 1.8 10*3/uL (ref 0.7–3.1)
Lymphs: 36 %
MCH: 30.4 pg (ref 26.6–33.0)
MCHC: 32.2 g/dL (ref 31.5–35.7)
MCV: 94 fL (ref 79–97)
Monocytes Absolute: 0.4 10*3/uL (ref 0.1–0.9)
Monocytes: 8 %
Neutrophils Absolute: 2.4 10*3/uL (ref 1.4–7.0)
Neutrophils: 47 %
Platelets: 203 10*3/uL (ref 150–450)
RBC: 4.94 x10E6/uL (ref 4.14–5.80)
RDW: 12.8 % (ref 11.6–15.4)
WBC: 5 10*3/uL (ref 3.4–10.8)

## 2023-08-18 LAB — PSA: Prostate Specific Ag, Serum: 3.5 ng/mL (ref 0.0–4.0)

## 2023-08-18 LAB — COMPREHENSIVE METABOLIC PANEL WITH GFR
ALT: 19 IU/L (ref 0–44)
AST: 21 IU/L (ref 0–40)
Albumin: 4.6 g/dL (ref 3.8–4.8)
Alkaline Phosphatase: 68 IU/L (ref 44–121)
BUN/Creatinine Ratio: 15 (ref 10–24)
BUN: 19 mg/dL (ref 8–27)
Bilirubin Total: 0.8 mg/dL (ref 0.0–1.2)
CO2: 22 mmol/L (ref 20–29)
Calcium: 9.8 mg/dL (ref 8.6–10.2)
Chloride: 102 mmol/L (ref 96–106)
Creatinine, Ser: 1.24 mg/dL (ref 0.76–1.27)
Globulin, Total: 2.4 g/dL (ref 1.5–4.5)
Glucose: 97 mg/dL (ref 70–99)
Potassium: 4.7 mmol/L (ref 3.5–5.2)
Sodium: 140 mmol/L (ref 134–144)
Total Protein: 7 g/dL (ref 6.0–8.5)
eGFR: 60 mL/min/{1.73_m2} (ref >59)

## 2023-08-18 LAB — LIPID PANEL W/O CHOL/HDL RATIO
Cholesterol, Total: 196 mg/dL (ref 100–199)
HDL: 44 mg/dL (ref >39)
LDL Chol Calc (NIH): 134 mg/dL — ABNORMAL HIGH (ref 0–99)
Triglycerides: 97 mg/dL (ref 0–149)
VLDL Cholesterol Cal: 18 mg/dL (ref 5–40)

## 2023-08-18 LAB — TSH: TSH: 2.04 u[IU]/mL (ref 0.450–4.500)

## 2023-08-18 NOTE — Assessment & Plan Note (Signed)
 Under good control on current regimen. Continue current regimen. Continue to monitor. Call with any concerns. Refills given. Labs drawn today.

## 2023-08-18 NOTE — Assessment & Plan Note (Signed)
 Reassured patient. Continue to monitor.

## 2023-08-22 NOTE — Progress Notes (Signed)
 Lab appt scheduled.

## 2023-09-18 ENCOUNTER — Ambulatory Visit: Admitting: Dermatology

## 2023-09-18 ENCOUNTER — Encounter: Payer: Self-pay | Admitting: Dermatology

## 2023-09-18 DIAGNOSIS — W908XXA Exposure to other nonionizing radiation, initial encounter: Secondary | ICD-10-CM

## 2023-09-18 DIAGNOSIS — D492 Neoplasm of unspecified behavior of bone, soft tissue, and skin: Secondary | ICD-10-CM

## 2023-09-18 DIAGNOSIS — C4492 Squamous cell carcinoma of skin, unspecified: Secondary | ICD-10-CM

## 2023-09-18 DIAGNOSIS — L570 Actinic keratosis: Secondary | ICD-10-CM

## 2023-09-18 DIAGNOSIS — C44222 Squamous cell carcinoma of skin of right ear and external auricular canal: Secondary | ICD-10-CM | POA: Diagnosis not present

## 2023-09-18 DIAGNOSIS — D485 Neoplasm of uncertain behavior of skin: Secondary | ICD-10-CM

## 2023-09-18 HISTORY — DX: Squamous cell carcinoma of skin, unspecified: C44.92

## 2023-09-18 NOTE — Patient Instructions (Addendum)

## 2023-09-18 NOTE — Progress Notes (Signed)
   Follow-Up Visit   Subjective  Christopher Henderson is a 78 y.o. male who presents for the following: Irregular tender skin lesion on the R ear, present for a few months. Pt concerned and would like checked.  The following portions of the chart were reviewed this encounter and updated as appropriate: medications, allergies, medical history  Review of Systems:  No other skin or systemic complaints except as noted in HPI or Assessment and Plan.  Objective  Well appearing patient in no apparent distress; mood and affect are within normal limits.   A focused examination was performed of the following areas: the face and ears   Relevant exam findings are noted in the Assessment and Plan.  R sup forehead x 1, R mid forehead x 1, central forehead x 1, R temple x 1, R preauricular x 1 (5) Erythematous thin papules/macules with gritty scale.  R inf anti tragus 0.6 cm keratotic papule.    Assessment & Plan   ACTINIC KERATOSIS (5) R sup forehead x 1, R mid forehead x 1, central forehead x 1, R temple x 1, R preauricular x 1 (5) Actinic keratoses are precancerous spots that appear secondary to cumulative UV radiation exposure/sun exposure over time. They are chronic with expected duration over 1 year. A portion of actinic keratoses will progress to squamous cell carcinoma of the skin. It is not possible to reliably predict which spots will progress to skin cancer and so treatment is recommended to prevent development of skin cancer.  Recommend daily broad spectrum sunscreen SPF 30+ to sun-exposed areas, reapply every 2 hours as needed.  Recommend staying in the shade or wearing long sleeves, sun glasses (UVA+UVB protection) and wide brim hats (4-inch brim around the entire circumference of the hat). Call for new or changing lesions. Destruction of lesion - R sup forehead x 1, R mid forehead x 1, central forehead x 1, R temple x 1, R preauricular x 1 (5) Complexity: simple   Destruction method:  cryotherapy   Informed consent: discussed and consent obtained   Timeout:  patient name, date of birth, surgical site, and procedure verified Lesion destroyed using liquid nitrogen: Yes   Region frozen until ice ball extended beyond lesion: Yes   Outcome: patient tolerated procedure well with no complications   Post-procedure details: wound care instructions given   NEOPLASM OF UNCERTAIN BEHAVIOR OF SKIN R inf anti tragus Skin / nail biopsy Type of biopsy: tangential   Informed consent: discussed and consent obtained   Timeout: patient name, date of birth, surgical site, and procedure verified   Procedure prep:  Patient was prepped and draped in usual sterile fashion Prep type:  Isopropyl alcohol Anesthesia: the lesion was anesthetized in a standard fashion   Anesthetic:  1% lidocaine  w/ epinephrine 1-100,000 buffered w/ 8.4% NaHCO3 Instrument used: flexible razor blade   Hemostasis achieved with: pressure, aluminum chloride and electrodesiccation   Outcome: patient tolerated procedure well   Post-procedure details: sterile dressing applied and wound care instructions given   Dressing type: bandage (Mupirocin  2% ointment)   Specimen 1 - Surgical pathology Differential Diagnosis: D48.5 r/o SCC Check Margins: No  Return for appointment as scheduled.  LILLETTE Rosina Mayans, CMA, am acting as scribe for Boneta Sharps, MD .   Documentation: I have reviewed the above documentation for accuracy and completeness, and I agree with the above.  Boneta Sharps, MD

## 2023-09-21 LAB — SURGICAL PATHOLOGY

## 2023-09-25 ENCOUNTER — Ambulatory Visit: Payer: Self-pay | Admitting: Dermatology

## 2023-09-25 ENCOUNTER — Encounter: Payer: Self-pay | Admitting: Dermatology

## 2023-09-25 DIAGNOSIS — C4492 Squamous cell carcinoma of skin, unspecified: Secondary | ICD-10-CM

## 2023-09-25 NOTE — Telephone Encounter (Signed)
 Patient advised pathology showed SCC. Patient prefers [facility] for Mohs referral. Sent to Dr. Paci. Lonell RAMAN., RMA

## 2023-09-25 NOTE — Telephone Encounter (Signed)
-----   Message from [place] sent at 09/25/2023  1:24 PM EDT ----- Diagnosis: R inf anti tragus :       WELL DIFFERENTIATED SQUAMOUS CELL CARCINOMA, BASE INVOLVED   Please call with diagnosis and determine where the patient would like to have Mohs surgery.  Explanation: This is a squamous cell skin cancer that has grown beyond the surface of the skin and is invading the second layer of the skin. It has the potential to spread beyond the skin and  threaten your health, so I recommend treating it.  Treatment: Given the location and type of skin cancer, I recommend Mohs surgery. Mohs surgery involves cutting out the skin cancer and then checking under the microscope to ensure the whole skin  cancer was removed. If any skin cancer remains, the surgeon will cut out more until it is fully removed. The cure rate is about 98-99%. Once the Mohs surgeon confirms the skin cancer is out, they  will discuss the options to repair or heal the area. You must take it easy for about two weeks after surgery (no lifting over 10-15 lbs, avoid activity to get your heart rate and blood pressure up).  It is done at another office outside of [place] ([place], [place], or [place]). ----- Message ----- From: Interface, Lab In Three Zero One Sent: 09/21/2023   6:03 PM EDT To: Boneta Sharps, MD

## 2023-10-10 ENCOUNTER — Other Ambulatory Visit

## 2023-10-10 DIAGNOSIS — R972 Elevated prostate specific antigen [PSA]: Secondary | ICD-10-CM | POA: Diagnosis not present

## 2023-10-11 LAB — PSA: Prostate Specific Ag, Serum: 3.8 ng/mL (ref 0.0–4.0)

## 2023-10-16 ENCOUNTER — Encounter: Payer: Self-pay | Admitting: Family Medicine

## 2023-10-16 ENCOUNTER — Ambulatory Visit (INDEPENDENT_AMBULATORY_CARE_PROVIDER_SITE_OTHER): Admitting: Family Medicine

## 2023-10-16 VITALS — BP 158/82 | HR 59 | Temp 97.7°F | Ht 72.0 in | Wt 209.0 lb

## 2023-10-16 DIAGNOSIS — R109 Unspecified abdominal pain: Secondary | ICD-10-CM

## 2023-10-16 LAB — URINALYSIS, ROUTINE W REFLEX MICROSCOPIC
Bilirubin, UA: NEGATIVE
Glucose, UA: NEGATIVE
Ketones, UA: NEGATIVE
Nitrite, UA: NEGATIVE
Specific Gravity, UA: 1.025 (ref 1.005–1.030)
Urobilinogen, Ur: 1 mg/dL (ref 0.2–1.0)
pH, UA: 5.5 (ref 5.0–7.5)

## 2023-10-16 LAB — MICROSCOPIC EXAMINATION: Bacteria, UA: NONE SEEN

## 2023-10-16 MED ORDER — ONDANSETRON HCL 4 MG PO TABS
4.0000 mg | ORAL_TABLET | Freq: Once | ORAL | Status: AC
Start: 2023-10-16 — End: 2023-10-16
  Administered 2023-10-16: 4 mg via ORAL

## 2023-10-16 MED ORDER — ONDANSETRON 4 MG PO TBDP: 4.0000 mg | ORAL_TABLET | Freq: Three times a day (TID) | ORAL | 0 refills | Status: AC | PRN

## 2023-10-16 MED ORDER — KETOROLAC TROMETHAMINE 60 MG/2ML IM SOLN
60.0000 mg | Freq: Once | INTRAMUSCULAR | Status: AC
  Administered 2023-10-16: 60 mg via INTRAMUSCULAR

## 2023-10-16 MED ORDER — CIPROFLOXACIN HCL 500 MG PO TABS: 500.0000 mg | ORAL_TABLET | Freq: Two times a day (BID) | ORAL | 0 refills | Status: DC

## 2023-10-16 MED ORDER — ONDANSETRON 4 MG PO TBDP: 4.0000 mg | ORAL_TABLET | Freq: Once | ORAL | Status: DC

## 2023-10-16 MED ORDER — TRAMADOL HCL 50 MG PO TABS: 50.0000 mg | ORAL_TABLET | Freq: Three times a day (TID) | ORAL | 0 refills | Status: AC | PRN

## 2023-10-16 NOTE — Addendum Note (Signed)
 Addended by: VICCI DUWAINE SQUIBB on: 10/16/2023 01:54 PM   Modules accepted: Orders

## 2023-10-16 NOTE — Addendum Note (Signed)
 Addended by: Wheeler Incorvaia T on: 10/16/2023 02:13 PM   Modules accepted: Orders

## 2023-10-16 NOTE — Progress Notes (Signed)
 BP (!) 158/82   Pulse (!) 59   Temp 97.7 F (36.5 C) (Oral)   Ht 6' (1.829 m)   Wt 209 lb (94.8 kg)   SpO2 97%   BMI 28.35 kg/m    Subjective:    Patient ID: Christopher Henderson, male    DOB: 04-29-1945, 78 y.o.   MRN: [id number]  HPI: Christopher Henderson is a 78 y.o. male  Chief Complaint  Patient presents with   Flank Pain    Right side. Onset last tonight. Denies  injury. OTC meds minimal relief.    URINARY SYMPTOMS Duration: last night Dysuria: no Urinary frequency: no Urgency: no Small volume voids: no Symptom severity: severe Urinary incontinence: no Foul odor: no Hematuria: no Abdominal pain: no Back pain: yes Suprapubic pain/pressure: no Flank pain: yes Fever:  no Vomiting: no Relief with cranberry juice: no Relief with pyridium: no Status: worse Previous urinary tract infection: no Recurrent urinary tract infection: no History of sexually transmitted disease: no Penile discharge: no Treatments attempted: aleve, increasing fluids   Relevant past medical, surgical, family and social history reviewed and updated as indicated. Interim medical history since our last visit reviewed. Allergies and medications reviewed and updated.  Review of Systems  Constitutional: Negative.   Respiratory: Negative.    Cardiovascular: Negative.   Genitourinary:  Positive for flank pain. Negative for decreased urine volume, difficulty urinating, dysuria, enuresis, frequency, genital sores, hematuria, penile discharge, penile pain, penile swelling, scrotal swelling, testicular pain and urgency.  Musculoskeletal:  Positive for back pain. Negative for arthralgias, gait problem, joint swelling, myalgias, neck pain and neck stiffness.  Neurological: Negative.   Psychiatric/Behavioral: Negative.      Per HPI unless specifically indicated above     Objective:    BP (!) 158/82   Pulse (!) 59   Temp 97.7 F (36.5 C) (Oral)   Ht 6' (1.829 m)   Wt 209 lb (94.8 kg)   SpO2 97%    BMI 28.35 kg/m   Wt Readings from Last 3 Encounters:  10/16/23 209 lb (94.8 kg)  08/17/23 202 lb (91.6 kg)  02/16/23 195 lb 6.4 oz (88.6 kg)    Physical Exam Vitals and nursing note reviewed.  Constitutional:      General: He is in acute distress.     Appearance: Normal appearance. He is not ill-appearing, toxic-appearing or diaphoretic.     Comments: Visably uncomfortable- writhing in the chair  HENT:     Head: Normocephalic and atraumatic.     Right Ear: External ear normal.     Left Ear: External ear normal.     Nose: Nose normal.     Mouth/Throat:     Mouth: Mucous membranes are moist.     Pharynx: Oropharynx is clear.  Eyes:     General: No scleral icterus.       Right eye: No discharge.        Left eye: No discharge.     Extraocular Movements: Extraocular movements intact.     Conjunctiva/sclera: Conjunctivae normal.     Pupils: Pupils are equal, round, and reactive to light.  Cardiovascular:     Rate and Rhythm: Normal rate and regular rhythm.     Pulses: Normal pulses.     Heart sounds: Normal heart sounds. No murmur heard.    No friction rub. No gallop.  Pulmonary:     Effort: Pulmonary effort is normal. No respiratory distress.     Breath sounds: Normal breath  sounds. No stridor. No wheezing, rhonchi or rales.  Chest:     Chest wall: No tenderness.  Abdominal:     General: Abdomen is flat. Bowel sounds are normal. There is no distension.     Palpations: Abdomen is soft. There is no mass.     Tenderness: There is no abdominal tenderness. There is right CVA tenderness. There is no left CVA tenderness, guarding or rebound.     Hernia: No hernia is present.  Musculoskeletal:        General: Normal range of motion.     Cervical back: Normal range of motion and neck supple.  Skin:    General: Skin is warm and dry.     Capillary Refill: Capillary refill takes less than 2 seconds.     Coloration: Skin is not jaundiced or pale.     Findings: No bruising, erythema,  lesion or rash.  Neurological:     General: No focal deficit present.     Mental Status: He is alert and oriented to person, place, and time. Mental status is at baseline.  Psychiatric:        Mood and Affect: Mood normal.        Behavior: Behavior normal.        Thought Content: Thought content normal.        Judgment: Judgment normal.     Results for orders placed or performed in visit on 10/10/23  PSA   Collection Time: 10/10/23 11:48 AM  Result Value Ref Range   Prostate Specific Ag, Serum 3.8 0.0 - 4.0 ng/mL      Assessment & Plan:   Problem List Items Addressed This Visit   None Visit Diagnoses       Flank pain    -  Primary   3+ blood and trace leuks on UA- concern for kidney stone. Will check CT renal stone and treat with pain meds and abx. Await results.   Relevant Medications   ondansetron  (ZOFRAN -ODT) disintegrating tablet 4 mg   ketorolac  (TORADOL ) injection 60 mg (Start on 10/16/2023  2:00 PM)   Other Relevant Orders   Urinalysis, Routine w reflex microscopic   CT RENAL STONE STUDY        Follow up plan: Return pending results.SABRA

## 2023-10-16 NOTE — Addendum Note (Signed)
 Addended by: VICCI DUWAINE SQUIBB on: 10/16/2023 02:22 PM   Modules accepted: Orders

## 2023-10-16 NOTE — Addendum Note (Signed)
 Addended by: VICCI DUWAINE SQUIBB on: 10/16/2023 02:24 PM   Modules accepted: Orders

## 2023-10-16 NOTE — Telephone Encounter (Signed)
 This encounter was created in error - please disregard.

## 2023-10-17 ENCOUNTER — Ambulatory Visit
Admission: RE | Admit: 2023-10-17 | Discharge: 2023-10-17 | Disposition: A | Discharge status: Home / Self Care | Source: Ambulatory Visit | Attending: Family Medicine | Admitting: Family Medicine

## 2023-10-17 ENCOUNTER — Ambulatory Visit: Payer: Self-pay | Admitting: Family Medicine

## 2023-10-17 ENCOUNTER — Other Ambulatory Visit: Payer: Self-pay | Admitting: Family Medicine

## 2023-10-17 DIAGNOSIS — K429 Umbilical hernia without obstruction or gangrene: Secondary | ICD-10-CM | POA: Diagnosis not present

## 2023-10-17 DIAGNOSIS — N132 Hydronephrosis with renal and ureteral calculous obstruction: Secondary | ICD-10-CM | POA: Diagnosis not present

## 2023-10-17 DIAGNOSIS — K5730 Diverticulosis of large intestine without perforation or abscess without bleeding: Secondary | ICD-10-CM | POA: Diagnosis not present

## 2023-10-17 DIAGNOSIS — K8020 Calculus of gallbladder without cholecystitis without obstruction: Secondary | ICD-10-CM | POA: Diagnosis not present

## 2023-10-17 DIAGNOSIS — R109 Unspecified abdominal pain: Secondary | ICD-10-CM | POA: Diagnosis not present

## 2023-10-17 MED ORDER — TAMSULOSIN HCL 0.4 MG PO CAPS: 0.4000 mg | ORAL_CAPSULE | Freq: Every day | ORAL | 0 refills | Status: DC

## 2023-10-17 NOTE — Progress Notes (Signed)
 Called patient with 2 kidney stones on CT. Will start flomax . Not in any pain right now. Call with any concerns.

## 2023-10-18 ENCOUNTER — Encounter: Payer: Self-pay | Admitting: Dermatology

## 2023-10-18 ENCOUNTER — Ambulatory Visit: Payer: Self-pay | Admitting: Family Medicine

## 2023-10-18 LAB — URINE CULTURE: Organism ID, Bacteria: NO GROWTH

## 2023-10-25 ENCOUNTER — Encounter: Payer: Self-pay | Admitting: Dermatology

## 2023-10-25 ENCOUNTER — Ambulatory Visit: Admitting: Dermatology

## 2023-10-25 VITALS — Temp 98.3°F

## 2023-10-25 DIAGNOSIS — L579 Skin changes due to chronic exposure to nonionizing radiation, unspecified: Secondary | ICD-10-CM | POA: Diagnosis not present

## 2023-10-25 DIAGNOSIS — C44222 Squamous cell carcinoma of skin of right ear and external auricular canal: Secondary | ICD-10-CM | POA: Diagnosis not present

## 2023-10-25 DIAGNOSIS — C4492 Squamous cell carcinoma of skin, unspecified: Secondary | ICD-10-CM

## 2023-10-25 DIAGNOSIS — L814 Other melanin hyperpigmentation: Secondary | ICD-10-CM | POA: Diagnosis not present

## 2023-10-25 MED ORDER — GENTAMICIN SULFATE 0.1 % EX OINT: 1.0000 | TOPICAL_OINTMENT | Freq: Three times a day (TID) | CUTANEOUS | 2 refills | Status: AC

## 2023-10-25 MED ORDER — MUPIROCIN 2 % EX OINT: 1.0000 | TOPICAL_OINTMENT | Freq: Two times a day (BID) | CUTANEOUS | 2 refills | Status: AC

## 2023-10-25 NOTE — Progress Notes (Unsigned)
 Follow-Up Visit   Subjective  Christopher Henderson is a 78 y.o. 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 Claudene.   The following portions of the chart were reviewed this encounter and updated as appropriate: medications, allergies, medical history  Review of Systems:  No other skin or systemic complaints except as noted in HPI or Assessment and Plan.  Objective  Well appearing patient in no apparent distress; mood and affect are within normal limits.  A focused examination was performed of the following areas: Right inferior antitragus Relevant physical exam findings are noted in the Assessment and Plan.   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 Yes   Was the anticoagulation regimen changed prior to Mohs? No    Anesthesia: Anesthesia method: local infiltration Local anesthetic: lidocaine  1% WITH epi  Procedure Details: Timeout: pre-procedure verification complete Procedure Prep: patient was prepped and draped in usual sterile fashion Prep type: chlorhexidine  Biopsy accession number: [id number] Biopsy lab: [facility] Frozen section biopsy performed: No   Specimen debulked: No   Pre-Op diagnosis: squamous cell carcinoma SCC subtype: well differentiated MohsAIQ Surgical site (if tumor spans multiple areas, please select predominant area): ear Surgery side: right Surgical site (from skin exam): Right inferior antitragus Pre-operative length (cm): 1 Pre-operative width (cm): 0.5 Indications for Mohs surgery: anatomic location where tissue conservation is critical Previously treated? No    Micrographic Surgery Details: Post-operative  length (cm): 1.3 Post-operative width (cm): 0.6 Number of Mohs stages: 1 Cumulative additional sections past 5 per stage: 0 Post surgery depth of defect: dermis Is this a complex case (associate members only): No    Stage 1    Tumor features identified on Mohs section: no tumor identified  Patient tolerance of procedure: tolerated well, no immediate complications  Reconstruction: Was the defect reconstructed?: No    Antibiotics: Does patient meet AHA guidelines for endocarditis?: No   Does patient meet AHA guidelines for orthopedic prophylaxis?: No   Were antibiotics given on the day of surgery? Yes   When were antibiotics given? post-operative Did surgery breach mucosa, expose cartilage/bone, involve an area of lymphedema/inflamed/infected tissue? No   Indication for post-operative antibiotics: anatomic location     Return in about 4 weeks (around 11/22/2023).  Christopher Henderson, Surg Tech III, am acting as scribe for [name], MD.    10/27/2023  HISTORY OF PRESENT ILLNESS  Christopher Henderson is seen in consultation at the request of Dr. Claudene for biopsy-proven Well Differentiated Squamous Cell Carcinoma of the right antitragus. They note that the area has been present for about 6 months increasing in size with time and bleeding.  There is no history of previous treatment.  Reports no other new or changing lesions and has no other complaints today.  Medications and allergies: see patient chart.  Review of systems: Reviewed 8 systems and notable for the above skin cancer.  All other systems reviewed are unremarkable/negative, unless noted in the HPI. Past medical history, surgical history, family history, social history were also reviewed and are noted in the chart/questionnaire.    PHYSICAL EXAMINATION  General: Well-appearing, in no acute distress, alert and oriented x 4. Vitals reviewed in chart (if available).   Skin: Exam  reveals a 1.0 x 0.5 cm erythematous papule and  biopsy scar on the right antitragus. There are rhytids, telangiectasias, and lentigines, consistent with photodamage.  Biopsy report(s) reviewed, confirming the diagnosis.   ASSESSMENT  1) Well Differentiated Squamous Cell Carcinoma of the right antitragus 2) photodamage 3) solar lentigines   PLAN   1. Due to location, size, histology, or recurrence and the likelihood of subclinical extension as well as the need to conserve normal surrounding tissue, the patient was deemed acceptable for Mohs micrographic surgery (MMS).  The nature and purpose of the procedure, associated benefits and risks including recurrence and scarring, possible complications such as pain, infection, and bleeding, and alternative methods of treatment if appropriate were discussed with the patient during consent. The lesion location was verified by the patient, by reviewing previous notes, pathology reports, and by photographs as well as angulation measurements if available.  Informed consent was reviewed and signed by the patient, and timeout was performed at 9:00 AM. See op note below.  2. For the photodamage and solar lentigines, sun protection discussed/information given on OTC sunscreens, and we recommend continued regular follow-up with primary dermatologist every 6 months or sooner for any growing, bleeding, or changing lesions. 3. Prognosis and future surveillance discussed. 4. Letter with treatment outcome sent to referring provider. 5. Pain acetaminophen /ibuprofen  MOHS MICROGRAPHIC SURGERY AND RECONSTRUCTION  Initial size:   1.0 x 0.5 cm Surgical defect/wound size: 1.3 x 0.6 cm Anesthesia:    0.33% lidocaine  with 1:200,000 epinephrine EBL:    <5 mL Complications:  None Repair type:   Second Intention   Stages: 1  STAGE I: Anesthesia achieved with 0.5% lidocaine  with 1:200,000 epinephrine. ChloraPrep applied. 1 section(s) excised using Mohs technique (this includes total peripheral and deep tissue margin  excision and evaluation with frozen sections, excised and interpreted by the same physician). The tumor was first debulked and then excised with an approx. 2mm margin.  Hemostasis was achieved with electrocautery as needed.  The specimen was then oriented, subdivided/relaxed, inked, and processed using Mohs technique.    Frozen section analysis revealed a clear deep and peripheral margin.   Reconstruction  Patient was notified of results and repair options were discussed, including second intention healing. After reviewing the advantages and disadvantages of each, we agreed on second intention healing as appropriate.   The surgical site was then lightly scrubbed with sterile, saline-soaked gauze.  The area was bandaged using Vaseline ointment, non-adherent gauze, gauze pads, and tape to provide an adequate pressure dressing.   The patient tolerated the procedure well, was given detailed written and verbal wound care instructions, and was discharged in good condition.  The patient will follow-up in 4 weeks and as scheduled with primary dermatologist.  Due to the defect's proximity to the ear and external auditory meatus, we are sending both mupirocin  and gentamicin  ointments for wound care.     Documentation: I have reviewed the above documentation for accuracy and completeness, and I agree with the above.  [name], MD

## 2023-10-25 NOTE — Patient Instructions (Signed)

## 2023-10-26 ENCOUNTER — Encounter: Payer: Self-pay | Admitting: Dermatology

## 2023-11-29 ENCOUNTER — Encounter: Payer: Self-pay | Admitting: Dermatology

## 2023-11-29 ENCOUNTER — Ambulatory Visit: Admitting: Dermatology

## 2023-11-29 DIAGNOSIS — S01301D Unspecified open wound of right ear, subsequent encounter: Secondary | ICD-10-CM | POA: Diagnosis not present

## 2023-11-29 DIAGNOSIS — T1490XD Injury, unspecified, subsequent encounter: Secondary | ICD-10-CM

## 2023-11-29 DIAGNOSIS — Z85828 Personal history of other malignant neoplasm of skin: Secondary | ICD-10-CM

## 2023-11-29 DIAGNOSIS — C4492 Squamous cell carcinoma of skin, unspecified: Secondary | ICD-10-CM

## 2023-11-29 NOTE — Progress Notes (Signed)
   Follow Up Visit   Subjective  Christopher Henderson is a 78 y.o. male who presents for the following: follow up from Mohs surgery   The patient presents for follow up from Mohs surgery for a SCC on the right inferior antitragus, treated on 10/25/23, healing by 2nd intention. The patient has been bandaging the wound as directed. The endorse the following concerns: none  The following portions of the chart were reviewed this encounter and updated as appropriate: medications, allergies, medical history  Review of Systems:  No other skin or systemic complaints except as noted in HPI or Assessment and Plan.  Objective  Well appearing patient in no apparent distress; mood and affect are within normal limits.  A focal examination was performed including scalp, head, face. All findings within normal limits unless otherwise noted below.  Healing wound with mild erythema  Relevant physical exam findings are noted in the Assessment and Plan.    Assessment & Plan   Healing Wound s/p Mohs for SCC, treated on 10/25/23, repaired with 2nd intention - Reassured that wound is healing well - No evidence of infection - No swelling, induration, purulence, dehiscence, or tenderness out of proportion to the clinical exam, see photo above - Discussed that scars take up to 12 months to mature from the date of surgery - Recommend SPF 30+ to scar daily to prevent purple color from UV exposure during scar maturation process - Discussed that erythema and raised appearance of scar will fade over the next 4-6 months - OK to start scar massage at 4-6 weeks post-op - Can consider silicone based products for scar healing starting at 6 weeks post-op - Ok to continue ointment daily to wound under a bandage for another week  HISTORY OF SQUAMOUS CELL CARCINOMA OF THE SKIN - No evidence of recurrence today - Recommend regular full body skin exams - Recommend daily broad spectrum sunscreen SPF 30+ to sun-exposed areas,  reapply every 2 hours as needed.  - Call if any new or changing lesions are noted between office visits  Return in about 3 weeks (around 12/20/2023) for wound check.  I, Darice Smock, CMA, am acting as scribe for [name], MD.   Documentation: I have reviewed the above documentation for accuracy and completeness, and I agree with the above.  [name], MD

## 2023-11-29 NOTE — Patient Instructions (Signed)

## 2023-12-19 ENCOUNTER — Ambulatory Visit: Admitting: Dermatology

## 2024-01-02 ENCOUNTER — Ambulatory Visit: Admitting: Dermatology

## 2024-01-08 ENCOUNTER — Ambulatory Visit: Admitting: Dermatology

## 2024-01-08 ENCOUNTER — Encounter: Payer: Self-pay | Admitting: Dermatology

## 2024-01-08 DIAGNOSIS — Z85828 Personal history of other malignant neoplasm of skin: Secondary | ICD-10-CM | POA: Diagnosis not present

## 2024-01-08 DIAGNOSIS — C4492 Squamous cell carcinoma of skin, unspecified: Secondary | ICD-10-CM

## 2024-01-08 DIAGNOSIS — L905 Scar conditions and fibrosis of skin: Secondary | ICD-10-CM | POA: Diagnosis not present

## 2024-01-08 NOTE — Patient Instructions (Signed)

## 2024-01-08 NOTE — Progress Notes (Signed)
   Follow Up Visit   Subjective  Christopher Henderson is a 78 y.o. male who presents for the following: follow up from Mohs surgery   The patient presents for follow up from Mohs surgery for a SCC on the right inferior antitragus, treated on 10/25/23, healing by 2nd intention. The patient has been bandaging the wound as directed. The endorse the following concerns: none  The following portions of the chart were reviewed this encounter and updated as appropriate: medications, allergies, medical history  Review of Systems:  No other skin or systemic complaints except as noted in HPI or Assessment and Plan.  Objective  Well appearing patient in no apparent distress; mood and affect are within normal limits.  A focal examination was performed including scalp, head, face. All findings within normal limits unless otherwise noted below.  Healing wound with mild erythema  Relevant physical exam findings are noted in the Assessment and Plan.    Assessment & Plan   Scar s/p Mohs for SCC on the right inferior antitragus, treated on 10/25/23, healing by 2nd intention - Reassured that wound is healing well - No evidence of infection - No swelling, induration, purulence, dehiscence, or tenderness out of proportion to the clinical exam, see photo above - Discussed that scars take up to 12 months to mature from the date of surgery - Recommend SPF 30+ to scar daily to prevent purple color from UV exposure during scar maturation process - Discussed that erythema and raised appearance of scar will fade over the next 4-6 months - OK to start scar massage at 4-6 weeks post-op - Can consider silicone based products for scar healing starting at 6 weeks post-op  HISTORY OF SQUAMOUS CELL CARCINOMA OF THE SKIN - No evidence of recurrence today - Recommend regular full body skin exams - Recommend daily broad spectrum sunscreen SPF 30+ to sun-exposed areas, reapply every 2 hours as needed.  - Call if any new or  changing lesions are noted between office visits    Return if symptoms worsen or fail to improve.  I, Darice Smock, CMA, am acting as scribe for Christopher Henderson.   Documentation: I have reviewed the above documentation for accuracy and completeness, and I agree with the above.  Christopher Henderson

## 2024-02-15 DIAGNOSIS — H40013 Open angle with borderline findings, low risk, bilateral: Secondary | ICD-10-CM | POA: Diagnosis not present

## 2024-02-15 DIAGNOSIS — H26493 Other secondary cataract, bilateral: Secondary | ICD-10-CM | POA: Diagnosis not present

## 2024-02-19 ENCOUNTER — Ambulatory Visit: Admitting: Family Medicine

## 2024-02-19 ENCOUNTER — Encounter: Payer: Self-pay | Admitting: Family Medicine

## 2024-02-19 VITALS — BP 120/76 | HR 64 | Temp 97.6°F | Ht 72.0 in | Wt 206.6 lb

## 2024-02-19 DIAGNOSIS — Z23 Encounter for immunization: Secondary | ICD-10-CM

## 2024-02-19 DIAGNOSIS — Z1211 Encounter for screening for malignant neoplasm of colon: Secondary | ICD-10-CM

## 2024-02-19 DIAGNOSIS — N138 Other obstructive and reflux uropathy: Secondary | ICD-10-CM

## 2024-02-19 DIAGNOSIS — D692 Other nonthrombocytopenic purpura: Secondary | ICD-10-CM | POA: Diagnosis not present

## 2024-02-19 DIAGNOSIS — M170 Bilateral primary osteoarthritis of knee: Secondary | ICD-10-CM | POA: Diagnosis not present

## 2024-02-19 DIAGNOSIS — N401 Enlarged prostate with lower urinary tract symptoms: Secondary | ICD-10-CM | POA: Diagnosis not present

## 2024-02-19 DIAGNOSIS — I10 Essential (primary) hypertension: Secondary | ICD-10-CM

## 2024-02-19 DIAGNOSIS — Z800 Family history of malignant neoplasm of digestive organs: Secondary | ICD-10-CM

## 2024-02-19 MED ORDER — TAMSULOSIN HCL 0.4 MG PO CAPS: 0.4000 mg | ORAL_CAPSULE | Freq: Every day | ORAL | 1 refills | Status: AC

## 2024-02-19 MED ORDER — AMLODIPINE BESYLATE 5 MG PO TABS: 5.0000 mg | ORAL_TABLET | Freq: Every day | ORAL | 1 refills | Status: AC

## 2024-02-19 MED ORDER — PANTOPRAZOLE SODIUM 40 MG PO TBEC: 40.0000 mg | DELAYED_RELEASE_TABLET | Freq: Every day | ORAL | 1 refills | Status: AC

## 2024-02-19 MED ORDER — BENAZEPRIL HCL 40 MG PO TABS: 40.0000 mg | ORAL_TABLET | Freq: Every day | ORAL | 1 refills | Status: AC

## 2024-02-19 NOTE — Assessment & Plan Note (Signed)
 Under good control on current regimen. Continue current regimen. Continue to monitor. Call with any concerns. Refills given. Labs drawn today.

## 2024-02-19 NOTE — Assessment & Plan Note (Signed)
 Reassured patient. Rechecking labs. Call with any concerns.

## 2024-02-19 NOTE — Progress Notes (Signed)
 BP 120/76   Pulse 64   Temp 97.6 F (36.4 C) (Oral)   Ht 6' (1.829 m)   Wt 206 lb 9.6 oz (93.7 kg)   SpO2 97%   BMI 28.02 kg/m    Subjective:    Patient ID: Christopher Henderson, male    DOB: 04/22/1945, 78 y.o.   MRN: [id number]  HPI: Christopher Henderson is a 78 y.o. male  Chief Complaint  Patient presents with   Hypertension    BP @ home avg 110-140/75-85   HYPERTENSION  Hypertension status: controlled  Satisfied with current treatment? yes Duration of hypertension: chronic BP monitoring frequency:  not checking BP range:  BP medication side effects:  no Medication compliance: excellent compliance Previous BP meds:benazepril , amlodipine  Aspirin: no Recurrent headaches: no Visual changes: no Palpitations: no Dyspnea: no Chest pain: no Lower extremity edema: no Dizzy/lightheaded: no  BPH BPH status: controlled Satisfied with current treatment?: yes Medication side effects: yes Medication compliance: excellent compliance Duration: chronic Nocturia: 1/night Urinary frequency:no Incomplete voiding: no Urgency: no Weak urinary stream: no Straining to start stream: no Dysuria: no Onset: gradual Severity: mild   Relevant past medical, surgical, family and social history reviewed and updated as indicated. Interim medical history since our last visit reviewed. Allergies and medications reviewed and updated.  Review of Systems  Constitutional: Negative.   Respiratory: Negative.    Cardiovascular: Negative.   Musculoskeletal: Negative.   Neurological: Negative.   Psychiatric/Behavioral: Negative.      Per HPI unless specifically indicated above     Objective:    BP 120/76   Pulse 64   Temp 97.6 F (36.4 C) (Oral)   Ht 6' (1.829 m)   Wt 206 lb 9.6 oz (93.7 kg)   SpO2 97%   BMI 28.02 kg/m   Wt Readings from Last 3 Encounters:  02/19/24 206 lb 9.6 oz (93.7 kg)  10/16/23 209 lb (94.8 kg)  08/17/23 202 lb (91.6 kg)    Physical Exam Vitals and nursing  note reviewed.  Constitutional:      General: He is not in acute distress.    Appearance: Normal appearance. He is not ill-appearing, toxic-appearing or diaphoretic.  HENT:     Head: Normocephalic and atraumatic.     Right Ear: External ear normal.     Left Ear: External ear normal.     Nose: Nose normal.     Mouth/Throat:     Mouth: Mucous membranes are moist.     Pharynx: Oropharynx is clear.  Eyes:     General: No scleral icterus.       Right eye: No discharge.        Left eye: No discharge.     Extraocular Movements: Extraocular movements intact.     Conjunctiva/sclera: Conjunctivae normal.     Pupils: Pupils are equal, round, and reactive to light.  Cardiovascular:     Rate and Rhythm: Normal rate and regular rhythm.     Pulses: Normal pulses.     Heart sounds: Normal heart sounds. No murmur heard.    No friction rub. No gallop.  Pulmonary:     Effort: Pulmonary effort is normal. No respiratory distress.     Breath sounds: Normal breath sounds. No stridor. No wheezing, rhonchi or rales.  Chest:     Chest wall: No tenderness.  Musculoskeletal:        General: Normal range of motion.     Cervical back: Normal range of motion and neck  supple.  Skin:    General: Skin is warm and dry.     Capillary Refill: Capillary refill takes less than 2 seconds.     Coloration: Skin is not jaundiced or pale.     Findings: No bruising, erythema, lesion or rash.  Neurological:     General: No focal deficit present.     Mental Status: He is alert and oriented to person, place, and time. Mental status is at baseline.  Psychiatric:        Mood and Affect: Mood normal.        Behavior: Behavior normal.        Thought Content: Thought content normal.        Judgment: Judgment normal.     Results for orders placed or performed in visit on 10/16/23  Microscopic Examination   Collection Time: 10/16/23  1:25 PM   Urine  Result Value Ref Range   WBC, UA 0-5 0 - 5 /hpf   RBC, Urine 0-2 0  - 2 /hpf   Epithelial Cells (non renal) 0-10 0 - 10 /hpf   Bacteria, UA None seen None seen/Few  Urinalysis, Routine w reflex microscopic   Collection Time: 10/16/23  1:25 PM  Result Value Ref Range   Specific Gravity, UA 1.025 1.005 - 1.030   pH, UA 5.5 5.0 - 7.5   Color, UA Yellow Yellow   Appearance Ur Clear Clear   Leukocytes,UA Trace Negative   Protein,UA 1+ (A) Negative/Trace   Glucose, UA Negative Negative   Ketones, UA Negative Negative   RBC, UA 3+ (A) Negative   Bilirubin, UA Negative Negative   Urobilinogen, Ur 1.0 0.2 - 1.0 mg/dL   Nitrite, UA Negative Negative   Microscopic Examination See below:   Urine Culture   Collection Time: 10/16/23  1:59 PM   Specimen: Urine   UR  Result Value Ref Range   Urine Culture, Routine Final report    Organism ID, Bacteria No growth       Assessment & Plan:   Problem List Items Addressed This Visit       Cardiovascular and Mediastinum   Essential hypertension - Primary   Under good control on current regimen. Continue current regimen. Continue to monitor. Call with any concerns. Refills given. Labs drawn today.        Relevant Medications   amLODipine  (NORVASC ) 5 MG tablet   benazepril  (LOTENSIN ) 40 MG tablet   Other Relevant Orders   Basic metabolic panel with GFR   Senile purpura   Reassured patient. Rechecking labs. Call with any concerns.       Relevant Medications   amLODipine  (NORVASC ) 5 MG tablet   benazepril  (LOTENSIN ) 40 MG tablet   Other Relevant Orders   CBC with Differential/Platelet     Genitourinary   BPH with obstruction/lower urinary tract symptoms   Under good control on current regimen. Continue current regimen. Continue to monitor. Call with any concerns. Refills given. Labs drawn today.        Relevant Medications   tamsulosin  (FLOMAX ) 0.4 MG CAPS capsule   Other Relevant Orders   PSA   Other Visit Diagnoses       Need for COVID-19 vaccine       Covid shot given today.   Relevant  Orders   Pf[employer] Comirnaty Covid -19 Vaccine 28yrs and older     Primary osteoarthritis of both knees       Wants 2nd opinion on knees- referral placed.  Relevant Orders   Ambulatory referral to Orthopedic Surgery     Screening for colon cancer       Referral to GI placed today.   Relevant Orders   Ambulatory referral to Gastroenterology     Family history of colon cancer       Referral to GI placed today.   Relevant Orders   Ambulatory referral to Gastroenterology        Follow up plan: Return in about 6 months (around 08/18/2024) for physical.

## 2024-02-20 ENCOUNTER — Telehealth: Payer: Self-pay

## 2024-02-20 ENCOUNTER — Ambulatory Visit: Payer: Self-pay | Admitting: Family Medicine

## 2024-02-20 ENCOUNTER — Other Ambulatory Visit: Payer: Self-pay

## 2024-02-20 DIAGNOSIS — Z860100 Personal history of colon polyps, unspecified: Secondary | ICD-10-CM

## 2024-02-20 LAB — CBC WITH DIFFERENTIAL/PLATELET
Basophils Absolute: 0 x10E3/uL (ref 0.0–0.2)
Basos: 1 %
EOS (ABSOLUTE): 0.5 x10E3/uL — ABNORMAL HIGH (ref 0.0–0.4)
Eos: 10 %
Hematocrit: 45.5 % (ref 37.5–51.0)
Hemoglobin: 14.5 g/dL (ref 13.0–17.7)
Immature Grans (Abs): 0 x10E3/uL (ref 0.0–0.1)
Immature Granulocytes: 0 %
Lymphocytes Absolute: 1.9 x10E3/uL (ref 0.7–3.1)
Lymphs: 34 %
MCH: 30.9 pg (ref 26.6–33.0)
MCHC: 31.9 g/dL (ref 31.5–35.7)
MCV: 97 fL (ref 79–97)
Monocytes Absolute: 0.4 x10E3/uL (ref 0.1–0.9)
Monocytes: 8 %
Neutrophils Absolute: 2.7 x10E3/uL (ref 1.4–7.0)
Neutrophils: 47 %
Platelets: 201 x10E3/uL (ref 150–450)
RBC: 4.7 x10E6/uL (ref 4.14–5.80)
RDW: 12.1 % (ref 11.6–15.4)
WBC: 5.5 x10E3/uL (ref 3.4–10.8)

## 2024-02-20 LAB — BASIC METABOLIC PANEL WITH GFR
BUN/Creatinine Ratio: 12 (ref 10–24)
BUN: 11 mg/dL (ref 8–27)
CO2: 24 mmol/L (ref 20–29)
Calcium: 9.4 mg/dL (ref 8.6–10.2)
Chloride: 103 mmol/L (ref 96–106)
Creatinine, Ser: 0.95 mg/dL (ref 0.76–1.27)
Glucose: 101 mg/dL — ABNORMAL HIGH (ref 70–99)
Potassium: 4.3 mmol/L (ref 3.5–5.2)
Sodium: 141 mmol/L (ref 134–144)
eGFR: 82 mL/min/1.73 (ref >59)

## 2024-02-20 LAB — PSA: Prostate Specific Ag, Serum: 3.7 ng/mL (ref 0.0–4.0)

## 2024-02-20 MED ORDER — NA SULFATE-K SULFATE-MG SULF 17.5-3.13-1.6 GM/177ML PO SOLN: 1.0000 | Freq: Once | ORAL | 0 refills | Status: AC

## 2024-02-20 NOTE — Telephone Encounter (Signed)
 Gastroenterology Pre-Procedure Review  Request Date: 06/03/24 Requesting Physician: Dr. Jinny  PATIENT REVIEW QUESTIONS: The patient responded to the following health history questions as indicated:    1. Are you having any GI issues? no 2. Do you have a personal history of Polyps? yes (last colonoscopy performed by Dr. Jinny 12/29/2018 recommended repeat in 5 years) 3. Do you have a family history of Colon Cancer or Polyps? yes (1st degree relative colon cancer before age 59 noted on the 12/31/2018 colonoscopy) 4. Diabetes Mellitus? no 5. Joint replacements in the past 12 months?no 6. Major health problems in the past 3 months?no 7. Any artificial heart valves, MVP, or defibrillator?no    MEDICATIONS & ALLERGIES:    Patient reports the following regarding taking any anticoagulation/antiplatelet therapy:   Plavix, Coumadin, Eliquis, Xarelto, Lovenox , Pradaxa, Brilinta, or Effient? no Aspirin? yes (81 mg daily)  Patient confirms/reports the following medications:  Current Outpatient Medications  Medication Sig Dispense Refill   amLODipine  (NORVASC ) 5 MG tablet Take 1 tablet (5 mg total) by mouth daily. 90 tablet 1   aspirin 81 MG tablet Take 81 mg by mouth daily.     benazepril  (LOTENSIN ) 40 MG tablet Take 1 tablet (40 mg total) by mouth daily. 90 tablet 1   dutasteride (AVODART) 0.5 MG capsule Take 0.5 mg by mouth every other day.     gentamicin  ointment (GARAMYCIN ) 0.1 % Apply 1 Application topically 3 (three) times daily. 15 g 2   ketoconazole  (NIZORAL ) 2 % cream Apply topically to both feet nightly for tinea pedis 30 g 11   latanoprost (XALATAN) 0.005 % ophthalmic solution Place 1 drop into both eyes at bedtime.     meloxicam (MOBIC) 15 MG tablet TAKE 1 TABLET EVERY DAY BY ORAL ROUTE WITH MEAL(S).     mupirocin  ointment (BACTROBAN ) 2 % Apply 1 Application topically 2 (two) times daily. 22 g 2   Omega-3 Fatty Acids (FISH OIL) 1000 MG CAPS Take 1,000 mg by mouth 2 (two) times daily.      ondansetron  (ZOFRAN -ODT) 4 MG disintegrating tablet Take 1 tablet (4 mg total) by mouth every 8 (eight) hours as needed for nausea or vomiting. 20 tablet 0   pantoprazole  (PROTONIX ) 40 MG tablet Take 1 tablet (40 mg total) by mouth daily. 90 tablet 1   prednisoLONE acetate (PRED FORTE) 1 % ophthalmic suspension 1 drop 4 (four) times daily.     sildenafil (VIAGRA) 100 MG tablet Take 100 mg by mouth daily as needed for erectile dysfunction.     tamsulosin  (FLOMAX ) 0.4 MG CAPS capsule Take 1 capsule (0.4 mg total) by mouth daily. 90 capsule 1   TURMERIC CURCUMIN PO Take 500 mg by mouth daily.     UNABLE TO FIND Take 20 mg by mouth daily. Eye Shield Plus Lutein     No current facility-administered medications for this visit.    Patient confirms/reports the following allergies:  Allergies  Allergen Reactions   Penicillins Hives   Shrimp [Shellfish Allergy] Hives    No orders of the defined types were placed in this encounter.   AUTHORIZATION INFORMATION Primary Insurance: 1D#: Group #:  Secondary Insurance: 1D#: Group #:  SCHEDULE INFORMATION: Date: 06/03/24 Time: Location: [facility]

## 2024-03-08 NOTE — Progress Notes (Signed)
 JASEN HARTSTEIN                                          MRN: [id number]   03/08/2024   The [facility] Quality Team Specialist reviewed this patient medical record for the purposes of chart review for care gap closure. The following were reviewed: abstraction for care gap closure-controlling blood pressure.    [facility] Quality Team

## 2024-04-17 NOTE — Progress Notes (Signed)
 GREOGORY CORNETTE                                          MRN: [id number]   04/17/2024   The [facility] Quality Team Specialist reviewed this patient medical record for the purposes of chart review for care gap closure. The following were reviewed: chart review for care gap closure-controlling blood pressure.    [facility] Quality Team

## 2024-06-03 ENCOUNTER — Ambulatory Visit: Admit: 2024-06-03 | Admitting: Gastroenterology

## 2024-06-03 SURGERY — COLONOSCOPY
Anesthesia: General

## 2024-06-05 ENCOUNTER — Ambulatory Visit: Admitting: Dermatology

## 2024-08-20 ENCOUNTER — Encounter: Admitting: Family Medicine
